# Patient Record
Sex: Female | Born: 1935 | Race: White | Hispanic: Yes
Health system: Midwestern US, Community
[De-identification: ages and names within clinical notes are randomized; demographics above are authoritative.]

## PROBLEM LIST (undated history)

## (undated) DIAGNOSIS — E785 Hyperlipidemia, unspecified: Secondary | ICD-10-CM

## (undated) DIAGNOSIS — N390 Urinary tract infection, site not specified: Secondary | ICD-10-CM

## (undated) DIAGNOSIS — R300 Dysuria: Secondary | ICD-10-CM

## (undated) DIAGNOSIS — S22000A Wedge compression fracture of unspecified thoracic vertebra, initial encounter for closed fracture: Principal | ICD-10-CM

## (undated) DIAGNOSIS — R9389 Abnormal findings on diagnostic imaging of other specified body structures: Secondary | ICD-10-CM

## (undated) DIAGNOSIS — M546 Pain in thoracic spine: Secondary | ICD-10-CM

## (undated) DIAGNOSIS — T7840XA Allergy, unspecified, initial encounter: Secondary | ICD-10-CM

## (undated) DIAGNOSIS — H269 Unspecified cataract: Secondary | ICD-10-CM

## (undated) HISTORY — DX: Allergy, unspecified, initial encounter: T78.40XA

## (undated) HISTORY — DX: Unspecified cataract: H26.9

## (undated) HISTORY — PX: ABDOMINAL HYSTERECTOMY: SHX81

---

## 2001-12-25 HISTORY — PX: COLONOSCOPY: SHX174

## 2014-07-02 DIAGNOSIS — G459 Transient cerebral ischemic attack, unspecified: Secondary | ICD-10-CM | POA: Insufficient documentation

## 2014-11-30 ENCOUNTER — Inpatient Hospital Stay: Admit: 2014-11-30 | Discharge: 2014-11-30 | Attending: Emergency Medicine

## 2014-11-30 ENCOUNTER — Emergency Department: Admit: 2014-11-30 | Primary: Registered Nurse

## 2014-11-30 DIAGNOSIS — M545 Low back pain, unspecified: Secondary | ICD-10-CM

## 2014-11-30 MED ORDER — LIDOCAINE 5 % EX PTCH
5 % | MEDICATED_PATCH | Freq: Every day | CUTANEOUS | Status: DC
Start: 2014-11-30 — End: 2017-03-20

## 2014-11-30 MED ORDER — LIDOCAINE 5 % EX PTCH
5 % | Freq: Every day | CUTANEOUS | Status: DC
Start: 2014-11-30 — End: 2014-11-30
  Administered 2014-11-30: 19:00:00 1 via TRANSDERMAL

## 2014-11-30 MED FILL — LIDODERM 5 % EX PTCH: 5 % | CUTANEOUS | Qty: 1

## 2014-11-30 NOTE — ED Provider Notes (Signed)
[facility] Emergency Department    CHIEF COMPLAINT  Fall; Back Pain; Head Injury; Nausea; and Shaking    HPI Comments: Lauren Bryant is an 80 year old female who presents to the ED with complaints of back pain after a fall. The patient states that on Friday evening she was walking into the garage and did not turn on the light.  She tripped and fell landing mostly on her back but does note that she struck her head on the concrete floor as well. She denies LOC, nausea, or vomiting at the time of or since the injury.  The patient notes that she is having back pain that is more to the right side and this is been ongoing since her fall.  She does state that this correlates with where she landed when she fell.  She denies abdominal pain, radiculopathy, saddle anesthesia, or changes in bowel or bladder function.  The daughter did state that the patient does have a small bruise on the right lateral thigh but otherwise there are no signs of injury.  Patient did point out that she doesn't even have a "goose egg"on the back of her head.  Patient has been up and walking since the injury and notes that her pain will increase in the morning and upon getting up out of a chair.  He does note that it will increase her pain to sit for long periods times she will have to change positions frequently.  [place] and is in town visiting with family.  She denies past medical history of CAD or diabetes.  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  Per the daughter the patient had a full workup in April for her complaints of dizziness that included carotid Dopplers which were negative at that time.  The patient does state that she continues to smoke a half pack per day.  She takes a baby aspirin at home and denies being on any other blood thinning medication.  Patient arrived to the ED via private car and is accompanied by  her daughter and son-in-law who are bedside for the ED visit.    The history is provided by the patient and a relative.     Nursing notes reviewed.   Past Medical History   Diagnosis Date   ??? Hyperlipidemia      Past Surgical History   Procedure Laterality Date   ??? Hysterectomy     ??? Appendectomy     ??? Tubal ligation       History reviewed. No pertinent family history.  History     Social History   ??? Marital Status: Widowed     Spouse Name: N/A     Number of Children: N/A   ??? Years of Education: N/A     Occupational History   ??? Not on file.     Social History Main Topics   ??? Smoking status: Current Every Day Smoker -- 0.50 packs/day     Types: Cigarettes   ??? Smokeless tobacco: Not on file   ??? Alcohol Use: No   ??? Drug Use: Not on file   ??? Sexual Activity: Not on file     Other Topics Concern   ??? Not on file     Social History Narrative   ??? No narrative on file     No current facility-administered medications for this encounter.  Current Outpatient Prescriptions   Medication Sig Dispense Refill   ??? venlafaxine (EFFEXOR) 37.5 MG tablet Take 37.5 mg by mouth daily     ??? simvastatin (ZOCOR) 20 MG tablet Take 20 mg by mouth nightly     ??? tolterodine (DETROL LA) 4 MG ER capsule Take 4 mg by mouth daily     ??? aspirin 81 MG tablet Take 81 mg by mouth daily     ??? ALPRAZolam (XANAX) 0.25 MG tablet Take 0.25 mg by mouth 3 times daily as needed for Sleep       No Known Allergies      Review of Systems   Constitutional: Negative for fever and chills.   HENT: Negative.    Respiratory: Negative.  Negative for shortness of breath.    Cardiovascular: Negative.  Negative for chest pain.   Gastrointestinal: Positive for nausea. Negative for vomiting, abdominal pain and diarrhea.   Genitourinary: Negative.  Negative for difficulty urinating.   Musculoskeletal: Positive for back pain. Negative for gait problem and neck pain.   Skin: Negative.  Negative for wound.   Neurological: Positive for dizziness. Negative for syncope, numbness  and headaches.   Hematological: Does not bruise/bleed easily.   Psychiatric/Behavioral: Negative for confusion.       Physical Exam   Constitutional: She is oriented to person, place, and time. Vital signs are normal. She appears well-developed and well-nourished.  Non-toxic appearance. No distress.   Awake and alert. Cooperative. Observed resting in bed, NAD   HENT:   Head: Normocephalic and atraumatic.   Right Ear: External ear normal.   Left Ear: External ear normal.   Nose: Nose normal.   Mouth/Throat: Uvula is midline, oropharynx is clear and moist and mucous membranes are normal.   Eyes: Conjunctivae and EOM are normal. Pupils are equal, round, and reactive to light. No scleral icterus.   Neck: Trachea normal and normal range of motion. Neck supple. No spinous process tenderness and no muscular tenderness present.   Cardiovascular: Normal rate, regular rhythm, S1 normal, S2 normal and normal heart sounds.    Pulses:       Radial pulses are 2+ on the right side, and 2+ on the left side.   Capillary refill is brisk in bilateral upper extremities   Pulmonary/Chest: Effort normal and breath sounds normal. She has no decreased breath sounds.   Equal and symmetric chest rise.  Breathing is unlabored. Speaking comfortably in full sentences.   Abdominal: Normal appearance and bowel sounds are normal. She exhibits no distension and no pulsatile midline mass. There is no tenderness.   Musculoskeletal: Normal range of motion.        Lumbar back: She exhibits tenderness. She exhibits normal range of motion and no bony tenderness.   No gross deformities or trauma noted.  On exam of lumbar spine, there is no swelling, bruising, or color change noted. There is no midline bony tenderness, without crepitus, deformity, or step off.  Patient exhibits tenderness of paraspinal musculature to right of midline.  There is point tenderness over the SI Joint.     Neurological: She is alert and oriented to person, place, and time. She  has normal strength. No cranial nerve deficit. GCS eye subscore is 4. GCS verbal subscore is 5. GCS motor subscore is 6.   Reflex Scores:       Patellar reflexes are 2+ on the right side and 2+ on the left side.  No focal motor or sensory deficits. Gait  was observed and is normal.   Skin: Skin is warm, dry and intact.   Skin is with good color.   Psychiatric:   Mood and affect appropriate. Speech is clear and articulate.   Nursing note and vitals reviewed.      Procedures: None    Radiology  Xr Pelvis Standard    11/30/2014   EXAMINATION: SINGLE VIEW OF THE PELVIS  11/30/2014 12:34 pm  COMPARISON: None.  HISTORY: Status post fall with acute pelvic pain.  Initial exam.  FINDINGS: The bone mineralization is osteopenic.  There degenerative change involving the lower lumbar spine and hips.  No acute fractures or dislocations are seen.  There is no diastasis of the pubic symphysis or sacroiliac joints. There are surgical clips within the pelvis.     11/30/2014   IMPRESSION: 1. No acute osseous abnormality involving the pelvis.     ED COURSE  Patient seen and evaluated. Work up preformed as above. Diagnostic testing reviewed and results discussed with patient.    This patient was also seen and evaluated by Joellyn Quailsina Gozman, MD    I spoke with Dr. Waymon BudgeGozman. We thoroughly discussed the history, physical exam, laboratory and imaging studies, as well as, emergency department course.     Ms. Lauren Bryant presented to the ED today with complaints of back pain after fall.  Patient presented with multiple complaints that included head injury, dizziness, and nausea.  Her back pain was her biggest concern.  On exam the patient is without bony midline tenderness to the lumbar spine.  She is very point tender to the right of the spine over the SI joint area.  The patient has been up and ambulating since the fall and without red flags symptoms of low back pain.  She has been managing at home with Aleve OTC twice a day.  She presented concerned because  her pain is not improving.  X-ray of the pelvis was ordered and negative for fracture or dislocations.  The patient will be prescribed a lidocaine patch and instructed to continue with the Aleve over-the-counter as she has been previously doing.  I did advise the patient to stay well hydrated and take this with food.  As the patient is from out of town I did instruct her to follow up with her PCP once she is back home or return to the ED for increasing symptoms.    The patient is stating she did hit the back of her head on the floor when she fell and hit her back.  She had no LOC or vomiting at the time of the injury or since. There are no hematomas or bony instability to palpation of the head. She did have nausea Saturday evening but this has since resolved.  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  Currently the patient is neurologically intact and she is not on any blood thinners.  I do not feel that further imaging is needed at this time based on the length of the injury and her current physical exam.  I did speak with the patient and her family regarding this and they are in agreement with this plan.      Patient received lidocaine patch for pain, just prior to discharge.  A discussion was had with the patient in her family regarding diagnosis, diagnostic testing results, treatment/ plan of care, and follow up.  All questions were answered. Patient  will follow up with PCP for further evaluation/treatment. Patient will return to ED for new/worsening symptoms.    Patient was sent home with a prescription for lidocaine patch. I did council patient on appropriate use of these medication.      MDM  No results found for this visit on 11/30/14.    I estimate there is LOW risk for ABDOMINAL AORTIC ANEURYSM, CAUDA EQUINA SYNDROME, EPIDURAL MASS LESION, SPINAL STENOSIS, OR HERNIATED DISK CAUSING SEVERE STENOSIS, thus I  consider the discharge disposition reasonable. Ursula Beath and I have discussed the diagnosis and risks, and we agree with discharging home to follow-up with their primary doctor. We also discussed returning to the Emergency Department immediately if new or worsening symptoms occur. We have discussed the symptoms which are most concerning (e.g., saddle anesthesia, urinary or bowel incontinence or retention, changing or worsening pain) that necessitate immediate return.    Final Impression    1. Right-sided low back pain without sciatica    2. Fall from other slipping, tripping, or stumbling        Blood pressure 130/66, pulse 71, temperature 97.9 ??F (36.6 ??C), temperature source Oral, resp. rate 15, height 5\' 5"  (1.651 m), weight 137 lb (62.143 kg), SpO2 97 %.    DISPOSITION  Patient was discharged to home in good condition.            Jennette Bill, NP  12/02/14 [phone]

## 2014-11-30 NOTE — ED Notes (Signed)
Called pharmacy regarding lido patch    Sharee PimpleJennifer Merlina Marchena, RN  11/30/14 1329

## 2014-11-30 NOTE — ED Provider Notes (Signed)
I independently performed a history and physical on Lauren Bryant.   All diagnostic, treatment, and disposition decisions were made by myself in conjunction with the mid-level provider. Please see their note for full H+P.     Briefly, this is a 80 y.o. female who presented to the ED with fall.  The patient had a mechanical fall in the garage 3 days ago.  She did hit her head but did not lose consciousness.  She has been having right-sided lower back pain since that time.  It is currently 5 out of 10.  It is worse with movement.  She is able to ambulate with a cane, which is her baseline.  She denies headache, nausea or vomiting.  On exam the patient has normal mental status and unremarkable neurologic exam.  Her spines are nontender.  She does have some tenderness to palpation over the right SI joint.  Abdomen is nontender.     No results found for this visit on 11/30/14.    ED COURSE/MDM  Patient seen and evaluated. Old records reviewed. Labs and imaging reviewed and results discussed with patient.  Patient comes in for evaluation of fall.  She has a normal neurologic exam and is 3 days out of her fall.  I do not believe she requires any head imaging.  Her spines are nontender.  She does have some tenderness to palpation of the right SI joint.  Pelvic x-ray was obtained and is negative.  I'm not concerned for an occult fracture.  The patient is ambulatory.  I do not believe that she requires any further workup in the emergency department.  She'll be discharged home with her family.At this time I do not believe the pt requires any further work up or management. My standard discharge instructions were provided. She was given strict return precautions. All questions were answered. She was discharged home in stable condition.         CLINICAL IMPRESSION  1. Right-sided low back pain without sciatica    2. Fall from other slipping, tripping, or stumbling        Blood pressure 130/66, pulse 71, temperature 97.9 ??F (36.6  ??C), temperature source Oral, resp. rate 15, height 5\' 5"  (1.651 m), weight 137 lb (62.143 kg), SpO2 97 %.    DISPOSITION  Lauren Bryant was discharged to home in stable condition.       Lauren Quailsina Finlee Concepcion, MD  11/30/14 [phone]

## 2015-03-12 ENCOUNTER — Ambulatory Visit (INDEPENDENT_AMBULATORY_CARE_PROVIDER_SITE_OTHER): Payer: Medicare Other | Admitting: Podiatry

## 2015-03-12 ENCOUNTER — Encounter: Payer: Self-pay | Admitting: Podiatry

## 2015-03-12 ENCOUNTER — Ambulatory Visit (INDEPENDENT_AMBULATORY_CARE_PROVIDER_SITE_OTHER): Payer: Medicare Other

## 2015-03-12 VITALS — BP 112/62 | HR 82 | Resp 16 | Ht 65.0 in | Wt 130.0 lb

## 2015-03-12 DIAGNOSIS — S99921A Unspecified injury of right foot, initial encounter: Secondary | ICD-10-CM | POA: Diagnosis not present

## 2015-03-12 DIAGNOSIS — S92301B Fracture of unspecified metatarsal bone(s), right foot, initial encounter for open fracture: Secondary | ICD-10-CM

## 2015-03-12 NOTE — Progress Notes (Signed)
Subjective:     Patient ID: Linda Owens, female   DOB: 03/06/1924, 81 y.o.   MRN: [id number]  HPI patient presents stating that she fell on her right foot around 3 weeks ago and it's been swelling on the side and painful but getting mildly better   Review of Systems  All other systems reviewed and are negative.      Objective:   Physical Exam  Constitutional: She is oriented to person, place, and time.  Musculoskeletal: Normal range of motion.  Neurological: She is oriented to person, place, and time.  Skin: Skin is warm and dry.  Nursing note and vitals reviewed.  neurovascular status found to be diminished but upon questioning she has no claudication symptoms and is able to walk without discomfort in her lower legs. She's got edema in the lateral side of the right foot with quite a bit of pain around the fifth metatarsal base and mild bruising of her forefoot. Patient is wearing regular shoes and is walking currently with a cane     Assessment:     Probable fracture base of fifth metatarsal right    Plan:     H&P and x-rays reviewed with patient. At this point I have recommended ice therapy and continued cane usage and she cannot take a boot or surgical shoe due to her instability and that this should heal okay as the pain is getting better. If no improvement she will reappoint for us to recheck and may require some form of immobilization

## 2015-03-12 NOTE — Progress Notes (Signed)
   Subjective:    Patient ID: Linda Owens, female    DOB: 08/16/1924, 81 y.o.   MRN: [id number]  HPI Comments: "I hurt this foot"  Patient c/o tender lateral and some dorsal forefoot right for about 1 month. She states she got up from a nap and foot was asleep and fell. It twisted. Got swollen and bruised. Better today, but still hurts sometimes.     Review of Systems  Genitourinary: Positive for frequency.  Musculoskeletal: Positive for back pain and gait problem.  Neurological: Positive for weakness.  Hematological: Bruises/bleeds easily.       Objective:   Physical Exam        Assessment & Plan:

## 2015-08-26 ENCOUNTER — Other Ambulatory Visit: Payer: Self-pay | Admitting: Family Medicine

## 2015-08-26 DIAGNOSIS — R42 Dizziness and giddiness: Secondary | ICD-10-CM

## 2015-09-03 ENCOUNTER — Ambulatory Visit
Admission: RE | Admit: 2015-09-03 | Discharge: 2015-09-03 | Disposition: A | Discharge status: Home / Self Care | Payer: Medicare Other | Source: Ambulatory Visit | Attending: Family Medicine | Admitting: Family Medicine

## 2015-09-03 DIAGNOSIS — R42 Dizziness and giddiness: Secondary | ICD-10-CM

## 2015-09-03 DIAGNOSIS — I6523 Occlusion and stenosis of bilateral carotid arteries: Secondary | ICD-10-CM | POA: Insufficient documentation

## 2015-09-22 DIAGNOSIS — R42 Dizziness and giddiness: Secondary | ICD-10-CM | POA: Insufficient documentation

## 2016-09-14 ENCOUNTER — Ambulatory Visit: Payer: Medicare Other | Admitting: Physical Therapy

## 2016-09-29 ENCOUNTER — Encounter: Payer: Medicare Other | Admitting: Physical Therapy

## 2016-10-06 ENCOUNTER — Encounter: Payer: Medicare Other | Admitting: Physical Therapy

## 2016-10-13 ENCOUNTER — Encounter: Payer: Medicare Other | Admitting: Physical Therapy

## 2016-10-20 ENCOUNTER — Encounter: Payer: Medicare Other | Admitting: Physical Therapy

## 2016-10-27 ENCOUNTER — Encounter: Payer: Medicare Other | Admitting: Physical Therapy

## 2017-03-12 ENCOUNTER — Ambulatory Visit: Payer: Medicare Other | Admitting: Hematology and Oncology

## 2017-03-20 ENCOUNTER — Inpatient Hospital Stay
Admit: 2017-03-20 | Discharge: 2017-03-22 | Disposition: A | Discharge status: Home / Self Care | Payer: Medicare Other | Source: Home / Self Care | Admitting: Family Medicine

## 2017-03-20 ENCOUNTER — Encounter: Admit: 2017-03-20 | Primary: Registered Nurse

## 2017-03-20 DIAGNOSIS — J189 Pneumonia, unspecified organism: Secondary | ICD-10-CM

## 2017-03-20 LAB — PROTIME-INR
INR: 1.21 — ABNORMAL HIGH (ref 0.85–1.15)
Protime: 13.7 s — ABNORMAL HIGH (ref 9.6–13.0)

## 2017-03-20 LAB — URINALYSIS
Bilirubin Urine: NEGATIVE
Blood, Urine: NEGATIVE
Glucose, Ur: NEGATIVE mg/dL
Ketones, Urine: NEGATIVE mg/dL
Leukocyte Esterase, Urine: NEGATIVE
Nitrite, Urine: NEGATIVE
Protein, UA: NEGATIVE mg/dL
Specific Gravity, UA: 1.015 (ref 1.005–1.030)
Urobilinogen, Urine: 0.2 E.U./dL (ref <2.0)
pH, UA: 6 (ref 5.0–8.0)

## 2017-03-20 LAB — CBC WITH AUTO DIFFERENTIAL
Atypical Lymphocytes Relative: 2 % (ref 0–6)
Bands Relative: 2 % (ref 0–7)
Basophils %: 0 %
Basophils Absolute: 0 10*3/uL (ref 0.0–0.2)
Eosinophils %: 3 %
Eosinophils Absolute: 0.6 10*3/uL (ref 0.0–0.6)
Hematocrit: 36.9 % (ref 36.0–48.0)
Hemoglobin: 12.2 g/dL (ref 12.0–16.0)
Lymphocytes %: 3 %
Lymphocytes Absolute: 1 10*3/uL (ref 1.0–5.1)
MCH: 29.5 pg (ref 26.0–34.0)
MCHC: 33 g/dL (ref 31.0–36.0)
MCV: 89.4 fL (ref 80.0–100.0)
MPV: 11.6 fL — ABNORMAL HIGH (ref 5.0–10.5)
Monocytes %: 7 %
Monocytes Absolute: 1.5 10*3/uL — ABNORMAL HIGH (ref 0.0–1.3)
Neutrophils %: 83 %
Neutrophils Absolute: 17.7 10*3/uL — ABNORMAL HIGH (ref 1.7–7.7)
Platelets: 137 10*3/uL (ref 135–450)
RBC Morphology: NORMAL
RBC: 4.13 M/uL (ref 4.00–5.20)
RDW: 15.4 % (ref 12.4–15.4)
WBC: 20.8 10*3/uL — ABNORMAL HIGH (ref 4.0–11.0)

## 2017-03-20 LAB — COMPREHENSIVE METABOLIC PANEL
ALT: 10 U/L (ref 10–40)
AST: 21 U/L (ref 15–37)
Albumin/Globulin Ratio: 1.5 (ref 1.1–2.2)
Albumin: 4.3 g/dL (ref 3.4–5.0)
Alkaline Phosphatase: 58 U/L (ref 40–129)
Anion Gap: 14 (ref 3–16)
BUN: 16 mg/dL (ref 7–20)
CO2: 26 mmol/L (ref 21–32)
Calcium: 9.1 mg/dL (ref 8.3–10.6)
Chloride: 96 mmol/L — ABNORMAL LOW (ref 99–110)
Creatinine: 0.8 mg/dL (ref 0.6–1.2)
GFR African American: >60 (ref >60)
GFR Non-African American: >60 (ref >60)
Globulin: 2.8 g/dL
Glucose: 140 mg/dL — ABNORMAL HIGH (ref 70–99)
Potassium: 4.3 mmol/L (ref 3.5–5.1)
Sodium: 136 mmol/L (ref 136–145)
Total Bilirubin: 1.3 mg/dL — ABNORMAL HIGH (ref 0.0–1.0)
Total Protein: 7.1 g/dL (ref 6.4–8.2)

## 2017-03-20 LAB — RAPID INFLUENZA A/B ANTIGENS
Rapid Influenza A Ag: NEGATIVE
Rapid Influenza B Ag: NEGATIVE

## 2017-03-20 LAB — BRAIN NATRIURETIC PEPTIDE: Pro-BNP: 1345 pg/mL — ABNORMAL HIGH (ref 0–449)

## 2017-03-20 LAB — LACTIC ACID: Lactic Acid: 1.1 mmol/L (ref 0.4–2.0)

## 2017-03-20 LAB — TROPONIN: Troponin: <0.01 ng/mL (ref <0.01)

## 2017-03-20 LAB — SAMPLE POSSIBLE BLOOD BANK TESTING

## 2017-03-20 MED ORDER — NORMAL SALINE FLUSH 0.9 % IV SOLN
0.9 % | Freq: Two times a day (BID) | INTRAVENOUS | Status: DC
Start: 2017-03-20 — End: 2017-03-22
  Administered 2017-03-20 – 2017-03-22 (×4): 10 mL via INTRAVENOUS

## 2017-03-20 MED ORDER — MAGNESIUM HYDROXIDE 400 MG/5ML PO SUSP
400 MG/5ML | Freq: Every day | ORAL | Status: DC | PRN
Start: 2017-03-20 — End: 2017-03-22

## 2017-03-20 MED ORDER — ALBUTEROL SULFATE (2.5 MG/3ML) 0.083% IN NEBU
RESPIRATORY_TRACT | Status: DC | PRN
Start: 2017-03-20 — End: 2017-03-22

## 2017-03-20 MED ORDER — SIMVASTATIN 10 MG PO TABS
10 MG | Freq: Every evening | ORAL | Status: DC
Start: 2017-03-20 — End: 2017-03-21
  Administered 2017-03-20: 20 mg via ORAL

## 2017-03-20 MED ORDER — CEFTRIAXONE SODIUM 1 G IJ SOLR
1 g | Freq: Once | INTRAMUSCULAR | Status: AC
Start: 2017-03-20 — End: 2017-03-20
  Administered 2017-03-20: 18:00:00 1 g via INTRAVENOUS

## 2017-03-20 MED ORDER — ENOXAPARIN SODIUM 40 MG/0.4ML SC SOLN
40 MG/0.4ML | Freq: Every day | SUBCUTANEOUS | Status: DC
Start: 2017-03-20 — End: 2017-03-22
  Administered 2017-03-21 – 2017-03-22 (×2): 40 mg via SUBCUTANEOUS

## 2017-03-20 MED ORDER — ACETAMINOPHEN 500 MG PO TABS
500 MG | Freq: Once | ORAL | Status: AC
Start: 2017-03-20 — End: 2017-03-20
  Administered 2017-03-20: 17:00:00 1000 mg via ORAL

## 2017-03-20 MED ORDER — AZITHROMYCIN 500 MG IV SOLR
500 | INTRAVENOUS | Status: DC
Start: 2017-03-20 — End: 2017-03-21

## 2017-03-20 MED ORDER — NORMAL SALINE FLUSH 0.9 % IV SOLN
0.9 % | INTRAVENOUS | Status: DC | PRN
Start: 2017-03-20 — End: 2017-03-22

## 2017-03-20 MED ORDER — SODIUM CHLORIDE 0.9 % IV BOLUS
0.9 % | Freq: Once | INTRAVENOUS | Status: AC
Start: 2017-03-20 — End: 2017-03-20
  Administered 2017-03-20: 17:00:00 2000 mL via INTRAVENOUS

## 2017-03-20 MED ORDER — ALPRAZOLAM 0.25 MG PO TABS
0.25 MG | Freq: Three times a day (TID) | ORAL | Status: DC | PRN
Start: 2017-03-20 — End: 2017-03-22
  Administered 2017-03-22: 0.25 mg via ORAL

## 2017-03-20 MED ORDER — KETOROLAC TROMETHAMINE 30 MG/ML IJ SOLN
30 MG/ML | Freq: Once | INTRAMUSCULAR | Status: AC
Start: 2017-03-20 — End: 2017-03-20
  Administered 2017-03-20: 19:00:00 15 mg via INTRAVENOUS

## 2017-03-20 MED ORDER — ACETAMINOPHEN 325 MG PO TABS
325 MG | ORAL | Status: DC | PRN
Start: 2017-03-20 — End: 2017-03-22

## 2017-03-20 MED ORDER — LIDOCAINE 5 % EX PTCH
5 % | Freq: Every day | CUTANEOUS | Status: DC
Start: 2017-03-20 — End: 2017-03-22

## 2017-03-20 MED ORDER — ONDANSETRON HCL 4 MG/2ML IJ SOLN
4 MG/2ML | Freq: Four times a day (QID) | INTRAMUSCULAR | Status: DC | PRN
Start: 2017-03-20 — End: 2017-03-22

## 2017-03-20 MED ORDER — VENLAFAXINE HCL 37.5 MG PO TABS
37.5 MG | Freq: Every day | ORAL | Status: DC
Start: 2017-03-20 — End: 2017-03-22
  Administered 2017-03-20 – 2017-03-22 (×2): 37.5 mg via ORAL

## 2017-03-20 MED ORDER — TROSPIUM CHLORIDE 20 MG PO TABS
20 MG | Freq: Every evening | ORAL | Status: DC
Start: 2017-03-20 — End: 2017-03-22
  Administered 2017-03-20 – 2017-03-22 (×2): 20 mg via ORAL

## 2017-03-20 MED ORDER — DEXTROSE 5 % IV SOLN (MINI-BAG)
5 % | Freq: Once | INTRAVENOUS | Status: AC
Start: 2017-03-20 — End: 2017-03-20
  Administered 2017-03-20: 18:00:00 500 mg via INTRAVENOUS

## 2017-03-20 MED ORDER — CEFTRIAXONE SODIUM 1 G IJ SOLR
1 | INTRAMUSCULAR | Status: DC
Start: 2017-03-20 — End: 2017-03-21

## 2017-03-20 MED ORDER — ASPIRIN 81 MG PO CHEW
81 MG | Freq: Every day | ORAL | Status: DC
Start: 2017-03-20 — End: 2017-03-22
  Administered 2017-03-20 – 2017-03-22 (×2): 81 mg via ORAL

## 2017-03-20 MED FILL — SANCTURA 20 MG PO TABS: 20 MG | ORAL | Qty: 1

## 2017-03-20 MED FILL — KETOROLAC TROMETHAMINE 30 MG/ML IJ SOLN: 30 MG/ML | INTRAMUSCULAR | Qty: 1

## 2017-03-20 MED FILL — VENLAFAXINE HCL 37.5 MG PO TABS: 37.5 MG | ORAL | Qty: 1

## 2017-03-20 MED FILL — ASPIRIN LOW STRENGTH 81 MG PO CHEW: 81 MG | ORAL | Qty: 1

## 2017-03-20 MED FILL — SIMVASTATIN 10 MG PO TABS: 10 MG | ORAL | Qty: 2

## 2017-03-20 MED FILL — AZITHROMYCIN 500 MG IV SOLR: 500 MG | INTRAVENOUS | Qty: 500

## 2017-03-20 MED FILL — MONOJECT FLUSH SYRINGE 0.9 % IV SOLN: 0.9 % | INTRAVENOUS | Qty: 10

## 2017-03-20 MED FILL — SODIUM CHLORIDE 0.9 % IV SOLN: 0.9 % | INTRAVENOUS | Qty: 2000

## 2017-03-20 MED FILL — CEFTRIAXONE SODIUM 1 G IJ SOLR: 1 g | INTRAMUSCULAR | Qty: 1

## 2017-03-20 MED FILL — ACETAMINOPHEN 500 MG PO TABS: 500 MG | ORAL | Qty: 2

## 2017-03-20 NOTE — ED Notes (Signed)
Bed: 14  Expected date:   Expected time:   Means of arrival:   Comments:     Lois HuxleyCatherine Taryll Reichenberger, RN  03/20/17 1139

## 2017-03-20 NOTE — ED Notes (Signed)
Straight cath urine obtained     Janee MornJohn Nieves Chapa, RN  03/20/17 1230

## 2017-03-20 NOTE — H&P (Signed)
Hospital Medicine History & Physical      PCP: No primary care provider on file.    Date of Admission: 03/20/2017    Date of Service: Pt seen/examined on 03/20/17 and Admitted to Inpatient with expected LOS greater than two midnights due to medical therapy.     Chief Complaint:  Fever and weakness    History Of Present Illness:      83 y.o. female who presented to [facility] with fever and weakness. Pt's family brought her to the ED because she was confused. She was having fevers today and was weaker than normal. She reportedly gets frequent UTIs. In ED, she was found to have pneumonia and was febrile. She was started on Abx. She is a poor historian.     Past Medical History:          Diagnosis Date   ??? Hyperlipidemia        Past Surgical History:          Procedure Laterality Date   ??? APPENDECTOMY     ??? HYSTERECTOMY     ??? TUBAL LIGATION         Medications Prior to Admission:      Prior to Admission medications    Medication Sig Start Date End Date Taking? Authorizing Provider   venlafaxine (EFFEXOR) 37.5 MG tablet Take 37.5 mg by mouth daily   Yes Historical Provider, MD   simvastatin (ZOCOR) 20 MG tablet Take 20 mg by mouth nightly   Yes Historical Provider, MD   tolterodine (DETROL LA) 4 MG ER capsule Take 4 mg by mouth daily   Yes Historical Provider, MD   aspirin 81 MG tablet Take 81 mg by mouth daily   Yes Historical Provider, MD   ALPRAZolam (XANAX) 0.25 MG tablet Take 0.25 mg by mouth 3 times daily as needed for Sleep   Yes Historical Provider, MD       Allergies:  Patient has no known allergies.    Social History:      The patient currently lives with family.    TOBACCO:   reports that she has been smoking Cigarettes.  She has been smoking about 0.50 packs per day. She has never used smokeless tobacco.  ETOH:   reports that she does not drink alcohol.      Family History:      Reviewed in detail and negative for DM, CAD, Cancer, CVA.   History reviewed. No pertinent family history.    REVIEW OF  SYSTEMS:   Pertinent positives as noted in the HPI. All other systems reviewed and negative.    PHYSICAL EXAM PERFORMED:    BP (!) 92/54    Pulse 98    Temp 99.8 ??F (37.7 ??C) (Oral)    Resp 18    Ht 5\' 5"  (1.651 m)    Wt 137 lb (62.1 kg)    SpO2 96%    BMI 22.80 kg/m??     General appearance:  No apparent distress, appears stated age and cooperative.  HEENT:  Normal cephalic, atraumatic without obvious deformity. Pupils equal, round, and reactive to light.  Extra ocular muscles intact. Conjunctivae/corneas clear.  Neck: Supple, with full range of motion. No jugular venous distention. Trachea midline.  Respiratory:  Normal respiratory effort. Clear to auscultation, bilaterally without Rales/Wheezes/Rhonchi.  Cardiovascular:  Regular rate and rhythm with normal S1/S2 without murmurs, rubs or gallops.  Abdomen: Soft, non-tender, non-distended with normal bowel sounds.  Musculoskeletal:  No clubbing, cyanosis or  edema bilaterally.  Full range of motion without deformity.  Skin: Skin color, texture, turgor normal.  No rashes or lesions.  Neurologic:  Neurovascularly intact without any focal sensory/motor deficits. Cranial nerves: II-XII intact, grossly non-focal.  Psychiatric:  Alert and oriented, thought content appropriate, normal insight  Capillary Refill: Brisk,< 3 seconds   Peripheral Pulses: +2 palpable, equal bilaterally       Labs:     Recent Labs      03/20/17   1220   WBC  20.8*   HGB  12.2   HCT  36.9   PLT  137     Recent Labs      03/20/17   1220   NA  136   K  4.3   CL  96*   CO2  26   BUN  16   CREATININE  0.8   CALCIUM  9.1     Recent Labs      03/20/17   1220   AST  21   ALT  10   BILITOT  1.3*   ALKPHOS  58     Recent Labs      03/20/17   1220   INR  1.21*     Recent Labs      03/20/17   1220   TROPONINI  <0.01       Urinalysis:      Lab Results   Component Value Date    NITRU Negative 03/20/2017    BLOODU Negative 03/20/2017    SPECGRAV 1.015 03/20/2017    GLUCOSEU Negative 03/20/2017       Radiology:      CXR: I have reviewed the CXR with the following interpretation: basilar opacities  EKG:  I have reviewed the EKG with the following interpretation: sinus tachycardia, PVCs    XR CHEST PORTABLE   Final Result   Nonspecific basilar reticular opacities.  Findings may indicate atelectasis,   chronic scarring, or airspace disease.             ASSESSMENT:    Active Hospital Problems    Diagnosis Date Noted   ??? Pneumonia [J18.9] 03/20/2017     83 yo female with h/o HLP, anxiety, frequent UTIs admitted with CAP.     PLAN:    CAP - started on Rocephin and Azithromycin on 3/27. Blood Cx pending. CXR with bibasilar opacities. Fever, leukocytosis, confusion on admission.    Anxiety - continue home xanax prn in moderation as with confusion.    HLP - continue home statin.    Acute delirium - confusion likely due to infection.  Supportive care and treat infection.    DVT Prophylaxis: lovenox  Diet:    Code Status: No Order    PT/OT Eval Status: will order once less confused    Dispo - 2-3 days       Beryle Flock, MD    Thank you No primary care provider on file. for the opportunity to be involved in this patient's care. If you have any questions or concerns please feel free to contact me at [phone].

## 2017-03-20 NOTE — ED Provider Notes (Signed)
[facility] Emergency Department    CHIEF COMPLAINT  Chief Complaint   Patient presents with   ??? Fever     UTI was treated end of february and then PCP states it was [facility] and treated her again that ended yesterday. Pt is from [place] and up here - her daughter brought her up   ??? Fatigue      HISTORY OF PRESENT ILLNESS  Lauren Bryant is a 83 y.o. female  who presents to the ED complaining of Altered mental status and generalized malaise and fatigue with subjective fevers since this morning.  She is here with her daughter who provides most of the history.  The patient is very hard of hearing and seems a little confused.  She denies any pain in the chest or abdomen to me, no nausea vomiting or diarrhea or back pains.  She does have frequent UTIs and in February was treated by her primary care physician in the [facility] for persistent infection in the urine, presumably E. coli per the daughter based on the urine culture they did there.  I do not have access to those records.  Last night the patient was apparently fairly independent and doing well, not complaining of anything, able to go out to dinner with the family and get to bed and perform some activities of daily living.  This morning though she is unable to get out of bed due to her malaise and fatigue.  She has not had any significant coughing.  She does have a smoking history.  She does not feel short of breath.    No other complaints, modifying factors or associated symptoms.     I have reviewed the following from the nursing documentation.    Past Medical History:   Diagnosis Date   ??? Hyperlipidemia      Past Surgical History:   Procedure Laterality Date   ??? APPENDECTOMY     ??? HYSTERECTOMY     ??? TUBAL LIGATION       History reviewed. No pertinent family history.  Social History     Social History   ??? Marital status: Widowed     Spouse name: N/A   ??? Number of children: N/A   ??? Years of education: N/A     Occupational History   ??? Not on  file.     Social History Main Topics   ??? Smoking status: Current Every Day Smoker     Packs/day: 0.50     Types: Cigarettes   ??? Smokeless tobacco: Never Used   ??? Alcohol use No   ??? Drug use: Unknown   ??? Sexual activity: Not on file     Other Topics Concern   ??? Not on file     Social History Narrative   ??? No narrative on file     Current Facility-Administered Medications   Medication Dose Route Frequency Provider Last Rate Last Dose   ??? azithromycin (ZITHROMAX) 500 mg in D5W addavial  500 mg Intravenous Once Dalbert Batman, MD 250 mL/hr at 03/20/17 1400 500 mg at 03/20/17 1400   ??? ketorolac (TORADOL) injection 15 mg  15 mg Intravenous Once Dalbert Batman, MD         Current Outpatient Prescriptions   Medication Sig Dispense Refill   ??? venlafaxine (EFFEXOR) 37.5 MG tablet Take 37.5 mg by mouth daily     ??? simvastatin (ZOCOR) 20 MG tablet Take 20 mg by mouth nightly     ???  tolterodine (DETROL LA) 4 MG ER capsule Take 4 mg by mouth daily     ??? aspirin 81 MG tablet Take 81 mg by mouth daily     ??? ALPRAZolam (XANAX) 0.25 MG tablet Take 0.25 mg by mouth 3 times daily as needed for Sleep     ??? lidocaine (LIDODERM) 5 % Place 1 patch onto the skin daily 12 hours on, 12 hours off. 30 patch 0     No Known Allergies    REVIEW OF SYSTEMS  10 systems reviewed, pertinent positives per HPI otherwise noted to be negative.    PHYSICAL EXAM  BP 100/61    Pulse 106    Temp 101.2 ??F (38.4 ??C) (Rectal)    Resp 16    Ht 5\' 5"  (1.651 m)    Wt 137 lb (62.1 kg)    SpO2 94%    BMI 22.80 kg/m??    GENERAL APPEARANCE: Awake and alert. Cooperative. No distress.  Pale.  HENT: Normocephalic. Atraumatic. Mucous membranes are dry.  NECK: Supple.  EYES: PERRL. EOM's grossly intact.  HEART/CHEST: Reg rhythm, tachy rate. No murmurs.  No chest wall tenderness.  LUNGS: Respirations unlabored. CTAB. Good air exchange. No rales.  No wheezing.  ABDOMEN: No tenderness. Soft. Non-distended. No masses. No organomegaly. No guarding or rebound. Normal  bowel sounds throughout.  MUSCULOSKELETAL: No extremity edema. Compartments soft.  No deformity.  No tenderness in the extremities.  All extremities neurovascularly intact.  SKIN: Warm and dry. No acute rashes.   NEUROLOGICAL: Alert and oriented. CN's 2-12 intact. No gross facial drooping. Strength 5/5, sensation intact. 2 plus DTR's in knees bilaterally.  Gait not assessed due to weakness.  Hard of hearing.    LABS  I have reviewed all labs for this visit.   Results for orders placed or performed during the hospital encounter of 03/20/17   Rapid influenza A/B antigens   Result Value Ref Range    Rapid Influenza A Ag Negative Negative    Rapid Influenza B Ag Negative Negative   CBC Auto Differential   Result Value Ref Range    WBC 20.8 (H) 4.0 - 11.0 K/uL    RBC 4.13 4.00 - 5.20 M/uL    Hemoglobin 12.2 12.0 - 16.0 g/dL    Hematocrit 16.1 09.6 - 48.0 %    MCV 89.4 80.0 - 100.0 fL    MCH 29.5 26.0 - 34.0 pg    MCHC 33.0 31.0 - 36.0 g/dL    RDW 04.5 40.9 - 81.1 %    Platelets 137 135 - 450 K/uL    MPV 11.6 (H) 5.0 - 10.5 fL    Neutrophils % 83.0 %    Lymphocytes % 3.0 %    Monocytes % 7.0 %    Eosinophils % 3.0 %    Basophils % 0.0 %    Neutrophils # 17.7 (H) 1.7 - 7.7 K/uL    Lymphocytes # 1.0 1.0 - 5.1 K/uL    Monocytes # 1.5 (H) 0.0 - 1.3 K/uL    Eosinophils # 0.6 0.0 - 0.6 K/uL    Basophils # 0.0 0.0 - 0.2 K/uL    Bands Relative 2 0 - 7 %    Atypical Lymphocytes Relative 2 0 - 6 %    RBC Morphology Normal    Comprehensive metabolic panel   Result Value Ref Range    Sodium 136 136 - 145 mmol/L    Potassium 4.3 3.5 - 5.1 mmol/L  Chloride 96 (L) 99 - 110 mmol/L    CO2 26 21 - 32 mmol/L    Anion Gap 14 3 - 16    Glucose 140 (H) 70 - 99 mg/dL    BUN 16 7 - 20 mg/dL    CREATININE 0.8 0.6 - 1.2 mg/dL    GFR Non-African American >60 >60    GFR African American >60 >60    Calcium 9.1 8.3 - 10.6 mg/dL    Total Protein 7.1 6.4 - 8.2 g/dL    Alb 4.3 3.4 - 5.0 g/dL    Albumin/Globulin Ratio 1.5 1.1 - 2.2    Total Bilirubin  1.3 (H) 0.0 - 1.0 mg/dL    Alkaline Phosphatase 58 40 - 129 U/L    ALT 10 10 - 40 U/L    AST 21 15 - 37 U/L    Globulin 2.8 g/dL   Protime-INR   Result Value Ref Range    Protime 13.7 (H) 9.6 - 13.0 sec    INR 1.21 (H) 0.85 - 1.15   Troponin   Result Value Ref Range    Troponin <0.01 <0.01 ng/mL   Brain Natriuretic Peptide   Result Value Ref Range    Pro-BNP 1,345 (H) 0 - 449 pg/mL   Urinalysis, reflex to microscopic   Result Value Ref Range    Color, UA Yellow Straw/Yellow    Clarity, UA Clear Clear    Glucose, Ur Negative Negative mg/dL    Bilirubin Urine Negative Negative    Ketones, Urine Negative Negative mg/dL    Specific Gravity, UA 1.015 1.005 - 1.030    Blood, Urine Negative Negative    pH, UA 6.0 5.0 - 8.0    Protein, UA Negative Negative mg/dL    Urobilinogen, Urine 0.2 <2.0 E.U./dL    Nitrite, Urine Negative Negative    Leukocyte Esterase, Urine Negative Negative    Microscopic Examination Not Indicated     Urine Type Not Specified    Lactic Acid, Plasma   Result Value Ref Range    Lactic Acid 1.1 0.4 - 2.0 mmol/L   Sample Possible Blood Bank Testing   Result Value Ref Range    Specimen Status COLL    EKG 12 Lead   Result Value Ref Range    Ventricular Rate 102 BPM    Atrial Rate 102 BPM    P-R Interval 130 ms    QRS Duration 114 ms    Q-T Interval 380 ms    QTc Calculation (Bazett) 495 ms    P Axis 37 degrees    R Axis 93 degrees    T Axis 28 degrees    Diagnosis       Sinus tachycardia with occasional Premature ventricular complexes  Right bundle branch block  Abnormal ECG  No previous ECGs available       The 12 lead EKG was interpreted by me as follows:  Rate: tachycardia with a rate of 102  Rhythm: sinus with PVC's  Axis: normal  Intervals: right bundle branch block  ST segments: no ST elevations or depressions  T waves: no abnormal inversions  Non-specific T wave changes: present  Prior EKG comparison: No prior is currently available for comparison    RADIOLOGY    Xr Chest Portable    Result Date:  03/20/2017  EXAMINATION: SINGLE VIEW OF THE CHEST 03/20/2017 11:50 am COMPARISON: None. HISTORY: ORDERING PHYSICIAN PROVIDED HISTORY: sepsis TECHNOLOGIST PROVIDED HISTORY: Technologist Provided Reason for Exam: Fever (UTI was  treated end of february and then PCP states it was [facility] and treated her again that ended yesterday. Pt is from [place] and up here - her daughter brought her up) FINDINGS: The heart size and mediastinal contours are within normal limits.  Mild basilar reticular opacities, primarily involving the left lung base.  The costophrenic angles are preserved.   Right carotid atherosclerotic calcification noted.     Nonspecific basilar reticular opacities.  Findings may indicate atelectasis, chronic scarring, or airspace disease.     ED COURSE/MDM  Patient seen and evaluated. Old records reviewed. Labs and imaging reviewed and results discussed with patient.      After initial evaluation, differential diagnostic considerations included: stroke, TIA, intracranial bleed, trauma, infection/sepsis, medication side effect, intoxication/withdrawal, metabolic/electrolyte abnormalities    The patient's ED workup was notable for her tachycardia and mental status changes concerning for severe sepsis, urinalysis does not appear infected however she does have some symptoms of pneumonia with her cough and equivocal findings on her chest x-ray shows she was covered with Rocephin and azithromycin for now.  Her rapid flu is negative.  She does have a profound leukocytosis as well but her lactate is normal and her blood pressure is stable.  She'll be admitted for ongoing IV fluids and IV antibiotics.    During the patient's ED course, the patient was given:  Medications   azithromycin (ZITHROMAX) 500 mg in D5W addavial (500 mg Intravenous New Bag 03/20/17 1400)   ketorolac (TORADOL) injection 15 mg (not administered)   0.9 % sodium chloride bolus (2,000 mLs Intravenous New Bag 03/20/17 1231)   acetaminophen  (TYLENOL) tablet 1,000 mg (1,000 mg Oral Given 03/20/17 1231)   cefTRIAXone (ROCEPHIN) 1 g in sterile water 10 mL IV syringe (0 g Intravenous Stopped 03/20/17 1400)        CLINICAL IMPRESSION  1. Pneumonia due to organism    2. Severe sepsis (HCC)    3. Acute encephalopathy        Blood pressure 100/61, pulse 106, temperature 101.2 ??F (38.4 ??C), temperature source Rectal, resp. rate 16, height 5\' 5"  (1.651 m), weight 137 lb (62.1 kg), SpO2 94 %.    DISPOSITION  Lauren Bryant was admitted in fair condition.    I have discussed the findings of today's workup with the patient and addressed the patient's questions and concerns.  The plan is to admit to the hospital at this time under the hospitalist service.  I spoke with Dr. Billey Gosling who accepted the patient and will take over the patient's care.  The patient is agreeable with this plan.    The total critical care time spent while evaluating and treating this patient was at least 40 minutes.  This excludes time spent doing separately billable procedures.  This includes time at the bedside, data interpretation, medication management, obtaining critical history from collateral sources if the patient is unable to provide it directly, and physician consultation.  Specifics of interventions taken and potentially life-threatening diagnostic considerations are listed above in the medical decision making.    DISCLAIMER: This chart was created using Scientist, clinical (histocompatibility and immunogenetics).  Efforts were made by me to ensure accuracy, however some errors may be present due to limitations of this technology and occasionally words are not transcribed correctly.        Dalbert Batman, MD  03/20/17 [phone]

## 2017-03-20 NOTE — Plan of Care (Signed)
Problem: Falls - Risk of  Goal: Absence of falls  Outcome: Ongoing  Bed in lowest locked positions, call light within reach, side rails up x 2, bed check in place. Instructed patient to call before getting out of bed. Patient verbalized understanding.

## 2017-03-20 NOTE — Other (Signed)
03/20/17 2021   Encounter Summary   Services provided to: Patient   Referral/Consult From: Rounding   Continue Visiting (03/20/17/Pt was sleeping. Did not disturb her)

## 2017-03-20 NOTE — Other (Signed)
Patient Acct Nbr:  [id number]  Primary AUTH/CERT:    Primary Insurance Company Name:   MEDICARE  Primary Insurance Plan Name:  MEDICARE INPT/OUTPT  Primary Insurance Group Number:    Primary Insurance Plan Type: [facility]  Primary Insurance Policy Number:  [id number]    Secondary AUTH/CERT:    Secondary Insurance Company Name:   [employer]  Secondary Insurance Plan Name:  TRICARE M/CARE COMP  Secondary Insurance Group Number:    Secondary Insurance Plan Type: S  Secondary Insurance Policy Number:  [id number]

## 2017-03-20 NOTE — ED Notes (Signed)
Bedside report given       Janee MornJohn Arie Powell, RN  03/20/17 [phone]

## 2017-03-20 NOTE — ED Notes (Signed)
M/S 355-2 when clean     Leanne Lovely, RN  03/20/17 [phone]

## 2017-03-20 NOTE — Progress Notes (Signed)
Patient admitted to room 355-2 from ED.  Patient oriented to room, call light, bed rails, phone, lights and bathroom.  Patient instructed about the schedule of the day including: vital sign frequency, lab draws, possible tests, frequency of MD and staff rounds, including RN/MD rounding together at bedside, daily weights, and I &O's.  Patient instructed about prescribed diet, how to use 8MENU, and television.  Bed alarm in place, patient aware of placement and reason. Bed locked, in lowest position, side rails up 2/4, call light within reach.  Will continue to monitor.

## 2017-03-20 NOTE — Plan of Care (Signed)
Problem: Falls - Risk of  Goal: Absence of falls  Outcome: Ongoing  Pt remains free from fall and injury. Non-skid slippers on. Fall risk band on wrist, bed alarm in use. Instructed to call for assistance. Call light in reach, will monitor.      Problem: Risk for Impaired Skin Integrity  Goal: Tissue integrity - skin and mucous membranes  Structural intactness and normal physiological function of skin and  mucous membranes.   Outcome: Ongoing  Encouraging q2 turns, sacral heart dressing in place. Legs and heels elevated off bed. Will continue to monitor.

## 2017-03-21 LAB — CBC WITH AUTO DIFFERENTIAL
Basophils %: 0.2 %
Basophils Absolute: 0 10*3/uL (ref 0.0–0.2)
Eosinophils %: 1.9 %
Eosinophils Absolute: 0.3 10*3/uL (ref 0.0–0.6)
Hematocrit: 30.5 % — ABNORMAL LOW (ref 36.0–48.0)
Hemoglobin: 10 g/dL — ABNORMAL LOW (ref 12.0–16.0)
Lymphocytes %: 6 %
Lymphocytes Absolute: 0.9 10*3/uL — ABNORMAL LOW (ref 1.0–5.1)
MCH: 29.5 pg (ref 26.0–34.0)
MCHC: 32.7 g/dL (ref 31.0–36.0)
MCV: 90.2 fL (ref 80.0–100.0)
MPV: 12.6 fL — ABNORMAL HIGH (ref 5.0–10.5)
Monocytes %: 7.5 %
Monocytes Absolute: 1.1 10*3/uL (ref 0.0–1.3)
Neutrophils %: 84.4 %
Neutrophils Absolute: 12.4 10*3/uL — ABNORMAL HIGH (ref 1.7–7.7)
PLATELET SLIDE REVIEW: DECREASED
Platelets: 99 10*3/uL — ABNORMAL LOW (ref 135–450)
RBC: 3.38 M/uL — ABNORMAL LOW (ref 4.00–5.20)
RDW: 15.9 % — ABNORMAL HIGH (ref 12.4–15.4)
WBC: 14.6 10*3/uL — ABNORMAL HIGH (ref 4.0–11.0)

## 2017-03-21 LAB — BASIC METABOLIC PANEL W/ REFLEX TO MG FOR LOW K
Anion Gap: 11 (ref 3–16)
BUN: 16 mg/dL (ref 7–20)
CO2: 23 mmol/L (ref 21–32)
Calcium: 8.3 mg/dL (ref 8.3–10.6)
Chloride: 98 mmol/L — ABNORMAL LOW (ref 99–110)
Creatinine: 0.6 mg/dL (ref 0.6–1.2)
GFR African American: >60 (ref >60)
GFR Non-African American: >60 (ref >60)
Glucose: 112 mg/dL — ABNORMAL HIGH (ref 70–99)
Potassium reflex Magnesium: 3.8 mmol/L (ref 3.5–5.1)
Sodium: 132 mmol/L — ABNORMAL LOW (ref 136–145)

## 2017-03-21 LAB — CULTURE, URINE: Urine Culture, Routine: NO GROWTH

## 2017-03-21 MED ORDER — LEVOFLOXACIN IN D5W 500 MG/100ML IV SOLN
500 MG/100ML | INTRAVENOUS | Status: DC
Start: 2017-03-21 — End: 2017-03-22
  Administered 2017-03-21: 17:00:00 500 mg via INTRAVENOUS

## 2017-03-21 MED FILL — AZITHROMYCIN 500 MG IV SOLR: 500 MG | INTRAVENOUS | Qty: 500

## 2017-03-21 MED FILL — ASPIRIN LOW STRENGTH 81 MG PO CHEW: 81 MG | ORAL | Qty: 1

## 2017-03-21 MED FILL — ALPRAZOLAM 0.25 MG PO TABS: 0.25 MG | ORAL | Qty: 1

## 2017-03-21 MED FILL — LIDODERM 5 % EX PTCH: 5 % | CUTANEOUS | Qty: 1

## 2017-03-21 MED FILL — MONOJECT FLUSH SYRINGE 0.9 % IV SOLN: 0.9 % | INTRAVENOUS | Qty: 10

## 2017-03-21 MED FILL — VENLAFAXINE HCL 37.5 MG PO TABS: 37.5 MG | ORAL | Qty: 1

## 2017-03-21 MED FILL — LOVENOX 40 MG/0.4ML SC SOLN: 40 MG/0.4ML | SUBCUTANEOUS | Qty: 0.4

## 2017-03-21 MED FILL — LEVAQUIN 500 MG/100ML IV SOLN: 500 MG/100ML | INTRAVENOUS | Qty: 100

## 2017-03-21 NOTE — Plan of Care (Signed)
Problem: Falls - Risk of  Goal: Absence of falls  Outcome: Ongoing

## 2017-03-21 NOTE — Progress Notes (Signed)
Speech Language Pathology  Facility/Department: [facility] - MED SURG   BEDSIDE SWALLOW EVALUATION    NAME: Lauren Bryant  DOB: [birth year]  MRN: [id number]    ADMISSION DATE: 03/20/2017  ADMITTING DIAGNOSIS: has Pneumonia on her problem list.  ONSET DATE:  Admitted 03/20/17    Recent Chest Xray/CT of Chest: (03/20/17)  Impression   Nonspecific basilar reticular opacities. ??Findings may indicate atelectasis,   chronic scarring, or airspace disease.       Date of Eval: 03/21/2017  Evaluating Therapist: Odis Luster    Reason for Referral  Lauren Bryant was referred for a bedside swallow evaluation to assess the efficiency of her swallow function, identify signs and symptoms of aspiration and make recommendations regarding safe dietary consistencies, effective compensatory strategies, and safe eating environment.    Current Diet level:  Current Diet : Regular  Current Liquid Diet : Thin    Primary Complaint  No specific complaints from pt or daughter.  Pt's daughter, Lauren Bryant [facility]), was present throughout evaluation.  Per report, pt consumes a regular/TL diet at baseline.  No prior hx of dyspahgia.  No hx of recurring pneumonia.  Lauren Bryant reports pt has been a long time smoker, has baseline cough at this time as it relates to smoking.  Discussed briefly with RN - no reported difficulty with diet tolerance or med intake (whole with water).  Pt was admitted on a regular/TL diet.  Daughter Lauren Bryant denies any concerns with regards to chewing, swallowing, aspiration, or diet tolerance.     Pain:  Patient Currently in Pain: Denies    Impression  Dysphagia Diagnosis: Swallow function appears grossly intact (with consideration for pt's advanced age and presbyphagia)  Dysphagia Outcome Severity Scale: Level 6: Within functional limits/Modified independence   ?? Overall oral and pharyngeal phases of the swallow appear grossly WFL when considering pt's advanced age and presbyphagia.  Mastication was slightly prolonged with mild post-swallow lingual  residue, however, pt was able to clear residue with liquid wash.  Despite prolonged mastication, overall bolus control and AP transit judged to be adequate.  Swallow initiation mildly delayed, but appeared effective.  No overt s/s of aspiration observed throughout duration of trials completed today (regular, puree, TL cup, TL straw).  Vocal quality clear, no throat clearing.  It should be noted that pt presented with a baseline cough prior to, during, and after PO trials today.  Daughter stated pt has been a long time smoker and relates cough to smoking history.  Briefly discussed with RN.  Cough does not appear to  Be directly related to swallowing.   ?? Recommend cont with regular/ TL diet.  Meds whole with water as tolerated.  ST to follow 1-2x as able to ensure safe and consistent diet tolerance.  All recommendations, precautions, and findings were discussed with pt and daughter Lauren Bryant, [place]) at bedside - all in agreement.  Rn updated.     Treatment Plan  Requires SLP Intervention: Yes  Duration/Frequency of Treatment: 1-2x f/u to ensure consistent and safe diet tolerance.     Recommended Diet and Intervention  Diet Solids Recommendation: Regular  Liquid Consistency Recommendation: Thin  Recommended Form of Meds: Whole with water  Therapeutic Interventions: Diet tolerance monitoring, Therapeutic PO trials with SLP, Patient/Family education    Compensatory Swallowing Strategies  Compensatory Swallowing Strategies: Alternate solids and liquids;Eat/Feed slowly;Upright as possible for all oral intake;Remain upright for 30-45 minutes after meals;Small bites/sips    Treatment/Goals  Short-term Goals  Timeframe for Short-term  Goals: 3-5 Days  Goal 1: Pt will tolerate recommended diet without observed clinical signs of aspiration.   Goal 2: Pt / Caregiver will demonstrate understanding of compensatory strategies for improved swallowing safety during PO intake.   Long-term Goals  Timeframe for Long-term Goals: 5  Days  Goal 1: Pt will safely and consistently tolerate a least restrictive PO diet without difficulty or overt s/s of aspiration.     General  Chart Reviewed: Yes  Subjective  Subjective: Pt seated upright at EOB.  Daughter Lauren Bryant, [place]) present throughout duration of evaluation.  Able to assist with providing history.    Behavior/Cognition: Alert;Cooperative;Pleasant mood;Confused  Respiratory Status: Room air  Communication Observation: Functional (Hard of hearing)  Follows Directions: Simple  Dentition: Dentures top;Dentures bottom (Full upper, partial lower)  Patient Positioning: Upright in bed (Seated EOB)  Baseline Vocal Quality: Normal  Volitional Cough: Strong (Pt has strong baseline cough, daughter reports it is from being a long time smoker and not having a cigarette over the past couple days.)  Prior Dysphagia History: None reported per pt or daughter / POA.  Consistencies Administered: Puree;Reg solid;Thin - cup;Thin - straw    Vision/Hearing  Vision  Vision: Impaired  Vision Exceptions: Wears glasses at all times  Hearing  Hearing: Ex[facility] to [facility]  Hearing Exceptions: Left hearing aid    Oral Motor Deficits  Oral/Motor  Oral Motor: Exceptions to [facility]  Lingual Symmetry: Abnormal symmetry left (Mild L side deviation upon protrusion)    Oral Phase Dysfunction  Oral phase of swallow grossly appears WNL.  No oral phase deficits noted during evaluation when considering pt's advanced age.     Indicators of Pharyngeal Phase Dysfunction  Pharyngeal phase of swallow appears grossly WNL. No pharyngeal deficits noted during evaluation.  Laryngeal elevation appears WFL based upon palpation of anterior neck during swallow when considering advanced age and presbyphagia.   No overt s/s of aspiration observed as directly r/t PO intake.   Vocal quality dry before and after po trials.    Prognosis  Prognosis  Prognosis for safe diet advancement: good  Barriers to reach goals: age  Individuals consulted  Consulted and  agree with results and recommendations: Patient;Family member  Family member consulted: Daughter/POA Lauren Bryant)    Education  Patient Education: Education provided to pt and daughter re: diet recommendations, safe swallowing precautions and POC for speech therapy.  Handout provided, reviewed, left with pt and daughter at bedside.   Patient Education Response: Verbalizes understanding;Needs reinforcement    G-Code  SLP G-Codes  Functional Limitations: Swallowing  Swallow Current Status [phone](G8996): At least 1 percent but less than 20 percent impaired, limited or restricted  Swallow Goal Status [phone](G8997): 0 percent impaired, limited or restricted    Therapy Time  SLP Individual Minutes  Time In: 1258  Time Out: 1321  Minutes: 23  Dysphagia Eval / Dysphagia Tx    Odis LusterLyndsey Leela Vanbrocklin, MS CCC-SLP [phone]  Speech-Language Pathologist

## 2017-03-21 NOTE — Progress Notes (Signed)
Pt sitting up side of bed getting ready to eat breakfast with daughter, resp easy, IV patent, assessment completed, pt denies pain or needs at this time, call light in reach, bed alarm active, will monitor.

## 2017-03-21 NOTE — Progress Notes (Signed)
Speech Language Pathology  SPEECH NOTE: MD orders read: Speech Language Pathology clinical swallow screening [SLP7] ?? ?? (Order #: [id number]). The patient has reduction to hearing function, increased confusion, trying to get out of bed, d/w the PCA. The patient has reduced ability to provide subjective information regarding her dysphagia symptoms. The patient had 03-20-17 CXR per MD reporting non-specific basilar reticular opacities. She reports that she has a left side hearing aid no present. She is on a regular consistency diet. She has 03-20-17 WBC 20.8. Bedside swallow evaluation follow up is pending. Screening completed time 0750-0800. No charges.   Jousha Schwandt CCC-SLP.D [facility]  03-21-17

## 2017-03-21 NOTE — Care Coordination-Inpatient (Signed)
CASE MANAGEMENT INITIAL ASSESSMENT    Reviewed chart and met with patient today, re: DCP  Explained Case Management role/services.     Family present: dtr  Primary contact information: Natasha Mead, dtr/POA [phone])    Admit date/status: 03/20/17, IPA  Diagnosis: PNA    Insurance: Medicare  Precert required for SNF - N        3 night stay required - Y    Living arrangements, Adls, care needs, prior to admission: visiting dtr here in [facility] (home alone in [place]), ADLs- some assistance from dtr    Transportation at discharge: dtr     Durable Medical Equipment at home: Walker__Cane_x_RTS__ BSC__Shower Chair__02__ HHN__ CPAP__  BiPap__  Hospital Bed__ W/C___ Other__________    Services in the home and/or outpatient, prior to admission: none    PT/OT recs: no needs identified or ordered    Hospital Exemption Notification (HEN): n/a    Barriers to discharge: none currently identified    Plan/comments: CM met with pt and pt's dtr at floor bs to assess DCP needs. Pt and pt's dtr declines any HHC needs and do not identify any other current DCP needs.     Pt currently ordered IV ABX. CM will continue to follow for poss IV ABX at dc.    ECOC on chart for MD signature  Ann Lions, CCM

## 2017-03-21 NOTE — Plan of Care (Signed)
Problem: Nutrition  Intervention: Swallowing evaluation  Bedside swallow evaluation completed this date. All further notes may be found in EMR.    Konni Kesinger, MS CCC-SLP #[id number]  Speech-Language Pathologist    Intervention: Aspiration precautions  Bedside swallow evaluation completed this date. All further notes may be found in EMR.    Reene Harlacher, MS CCC-SLP #[id number]  Speech-Language Pathologist

## 2017-03-21 NOTE — Plan of Care (Signed)
Problem: Falls - Risk of  Goal: Absence of falls  Pt remains free from falls, up with assistance only, call light in reach, bed alarm active, will monitor.

## 2017-03-21 NOTE — Progress Notes (Signed)
Hospitalist Progress Note      PCP: No primary care provider on file.    Date of Admission: 03/20/2017    Chief Complaint: fever and weakness    Hospital Course: 83 y.o. [facility] and [facility]. Pt's family brought her to the ED because she was confused. She was having fevers today and was weaker than normal. She reportedly gets frequent UTIs. In ED, she was found to have pneumonia and was febrile. She was started on Abx. She is a poor historian.     Subjective: Feeling better. Has a diffuse rash. Tol PO.     Medications:  Reviewed    Infusion Medications   Scheduled Medications   ??? levofloxacin  500 mg Intravenous Q24H   ??? aspirin  81 mg Oral Daily   ??? lidocaine  1 patch Transdermal Daily   ??? trospium  20 mg Oral Nightly   ??? venlafaxine  37.5 mg Oral Daily   ??? sodium chloride flush  10 mL Intravenous 2 times per day   ??? enoxaparin  40 mg Subcutaneous Daily     PRN Meds: ALPRAZolam, sodium chloride flush, acetaminophen, magnesium hydroxide, ondansetron, albuterol      Intake/Output Summary (Last 24 hours) at 03/21/17 1512  Last data filed at 03/21/17 1309   Gross per 24 hour   Intake              240 ml   Output              200 ml   Net               40 ml       Physical Exam Performed:    BP 128/65    Pulse 117    Temp 98.9 ??F (37.2 ??C) (Oral)    Resp 18    Ht 5\' 5"  (1.651 m)    Wt 137 lb (62.1 kg)    SpO2 93%    BMI 22.80 kg/m??     General appearance: No apparent distress, appears stated age and cooperative.  HEENT: Pupils equal, round, and reactive to light. Conjunctivae/corneas clear.  Neck: Supple, with full range of motion. No jugular venous distention. Trachea midline.  Respiratory:  Normal respiratory effort. Clear to auscultation, bilaterally without Rales/Wheezes/Rhonchi.  Cardiovascular: Regular rate and rhythm with normal S1/S2 without murmurs, rubs or gallops.  Abdomen: Soft, non-tender, non-distended with normal bowel sounds.  Musculoskeletal: No clubbing,  cyanosis or edema bilaterally.  Full range of motion without deformity.  Skin: diffuse maculopapular papules, waist down.  Neurologic:  Neurovascularly intact without any focal sensory/motor deficits. Cranial nerves: II-XII intact, grossly non-focal.  Psychiatric: Alert and oriented, thought content appropriate, normal insight  Capillary Refill: Brisk,< 3 seconds   Peripheral Pulses: +2 palpable, equal bilaterally       Labs:   Recent Labs      03/20/17   1220  03/21/17   0651   WBC  20.8*  14.6*   HGB  12.2  10.0*   HCT  36.9  30.5*   PLT  137  99*     Recent Labs      03/20/17   1220  03/21/17   0651   NA  136  132*   K  4.3  3.8   CL  96*  98*   CO2  26  23   BUN  16  16   CREATININE  0.8  0.6  CALCIUM  9.1  8.3     Recent Labs      03/20/17   1220   AST  21   ALT  10   BILITOT  1.3*   ALKPHOS  58     Recent Labs      03/20/17   1220   INR  1.21*     Recent Labs      03/20/17   1220   TROPONINI  <0.01       Urinalysis:    Lab Results   Component Value Date    NITRU Negative 03/20/2017    BLOODU Negative 03/20/2017    SPECGRAV 1.015 03/20/2017    GLUCOSEU Negative 03/20/2017       Radiology:  XR CHEST PORTABLE   Final Result   Nonspecific basilar reticular opacities.  Findings may indicate atelectasis,   chronic scarring, or airspace disease.                 Assessment/Plan:    Active Hospital Problems    Diagnosis Date Noted   ??? Pneumonia [J18.9] 03/20/2017     83 yo female with h/o HLP, anxiety, frequent UTIs admitted with CAP.   ??  PLAN:    Sepsis, POA in the setting of temp 102, heart rate 100, WBC 20.8 and pneumonia being treated with IV Rocephin, azithromycin 2 L IV bolus and Tylenol  ??  CAP - started on Rocephin and Azithromycin on 3/27. Blood Cx pending. CXR with bibasilar opacities. Fever, leukocytosis, confusion on admission.  ??  Anxiety - continue home xanax prn in moderation as with confusion.  ??  HLP - continue home statin.  ??  Acute delirium - confusion likely due to infection.  Supportive care and  treat infection.  ??  DVT Prophylaxis: lovenox  Diet: DIET GENERAL;  Dietary Nutrition Supplements: Standard High Calorie Oral Supplement  Code Status: Full Code    PT/OT Eval Status: baseline    Dispo - home tomorrow    Beryle Flock, MD

## 2017-03-21 NOTE — Progress Notes (Signed)
Nutrition Assessment    Type and Reason for Visit: Initial, Positive Nutrition Screen    Nutrition Recommendations:   Continue diet free of therapeutic restrictions   Trial Ensure BID  Will monitor nutritional adequacy, nutrition-related labs, weights, BMs, and clinical progress     Malnutrition Assessment:  ?? Malnutrition Status: Insufficient data    Nutrition Diagnosis:   ?? Problem: Inadequate oral intake  ?? Etiology: related to Insufficient energy/nutrient consumption    ??? Signs and symptoms:  as evidenced by Diet history of poor intake (per nsg screen )    Nutrition Assessment:  ?? Subjective Assessment: + Screen for malnutrition score of 2 and RD consult needed.  Pt presented with fever and weakness, found to have CAP.  Working with therapy during visit.  Per EMR, weights have been stable though limited weights available for review.  Currently on General diet, SLP eval complete.    ?? Nutrition-Focused Physical Findings: Considered underweight for age   ?? Wound Type: None (red areas )  ?? Current Nutrition Therapies:  ?? Oral Diet Orders: General   ?? Oral Diet intake: 1-25%  ?? Oral Nutrition Supplement (ONS) Orders: None  ?? Anthropometric Measures:  ?? Ht: 5\' 5"  (165.1 cm)   ?? Current Body Wt: 137 lb (62.1 kg)  ?? Usual Body Wt: 137 lb (62.1 kg)  ?? Ideal Body Wt: 125 lb (56.7 kg),   ?? BMI Classification: BMI 18.5 - 24.9 Normal Weight    Estimated Intake vs Estimated Needs: Intake Less Than Needs    Nutrition Risk Level: Moderate    Nutrition Interventions:   Continue current diet, Start ONS  Continued Inpatient Monitoring    Nutrition Evaluation:   ?? Evaluation: Goals set   ?? Goals: Tolerate diet and consume greater than 50% of meals and supplements     ?? Monitoring: Meal Intake, Supplement Intake, Diet Tolerance, Weight    See Adult Nutrition Doc Flowsheet for more detail.     Electronically signed by Yetta BarreSara Niguel Moure, RD, LD on 03/21/17 at 2:06 PM    Contact Number: [phone]

## 2017-03-21 NOTE — Consults (Signed)
Pharmacy consulted for renal dosing  Levaquin 500 mg daily Renal adjustment    Recent Labs      03/20/17   1220  03/21/17   0651   CREATININE  0.8  0.6     Estimated Creatinine Clearance: 55 mL/min (based on SCr of 0.6 mg/dL).    Clcr 20-49 mL/minute: Administer 500 mg initial dose, followed by 250 mg every 24 hours.    Clcr 10-19 mL/minute: Administer 500 mg initial dose, followed by 250 mg every 48 hours.    Will continue with 500 mg daily for now as CRCL above 50 ml/min.  Will continue to monitor as will need dose reduction if SCR rises.    Lauren Bryant, PharmD 11:38 AM 03/21/17

## 2017-03-21 NOTE — Plan of Care (Signed)
Problem: Nutrition  Goal: Optimal nutrition therapy  Outcome: Ongoing  Nutrition Problem: Inadequate oral intake  Intervention: Food and/or Nutrient Delivery: Continue current diet, Start ONS  Nutritional Goals: Tolerate diet and consume greater than 50% of meals and supplements

## 2017-03-22 LAB — CBC WITH AUTO DIFFERENTIAL
Basophils %: 0.1 %
Basophils Absolute: 0 10*3/uL (ref 0.0–0.2)
Eosinophils %: 5.5 %
Eosinophils Absolute: 0.4 10*3/uL (ref 0.0–0.6)
Hematocrit: 32.8 % — ABNORMAL LOW (ref 36.0–48.0)
Hemoglobin: 10.9 g/dL — ABNORMAL LOW (ref 12.0–16.0)
Lymphocytes %: 12.7 %
Lymphocytes Absolute: 0.9 10*3/uL — ABNORMAL LOW (ref 1.0–5.1)
MCH: 29.7 pg (ref 26.0–34.0)
MCHC: 33.3 g/dL (ref 31.0–36.0)
MCV: 89.2 fL (ref 80.0–100.0)
MPV: 11.7 fL — ABNORMAL HIGH (ref 5.0–10.5)
Monocytes %: 9.1 %
Monocytes Absolute: 0.7 10*3/uL (ref 0.0–1.3)
Neutrophils %: 72.6 %
Neutrophils Absolute: 5.4 10*3/uL (ref 1.7–7.7)
Platelets: 121 10*3/uL — ABNORMAL LOW (ref 135–450)
RBC: 3.68 M/uL — ABNORMAL LOW (ref 4.00–5.20)
RDW: 15.3 % (ref 12.4–15.4)
WBC: 7.4 10*3/uL (ref 4.0–11.0)

## 2017-03-22 LAB — BASIC METABOLIC PANEL
Anion Gap: 12 (ref 3–16)
BUN: 10 mg/dL (ref 7–20)
CO2: 26 mmol/L (ref 21–32)
Calcium: 9 mg/dL (ref 8.3–10.6)
Chloride: 97 mmol/L — ABNORMAL LOW (ref 99–110)
Creatinine: 0.6 mg/dL (ref 0.6–1.2)
GFR African American: >60 (ref >60)
GFR Non-African American: >60 (ref >60)
Glucose: 114 mg/dL — ABNORMAL HIGH (ref 70–99)
Potassium: 3.9 mmol/L (ref 3.5–5.1)
Sodium: 135 mmol/L — ABNORMAL LOW (ref 136–145)

## 2017-03-22 LAB — POCT GLUCOSE: POC Glucose: 103 mg/dl — ABNORMAL HIGH (ref 70–99)

## 2017-03-22 MED ORDER — PREDNISONE 10 MG PO TABS
10 MG | ORAL_TABLET | Freq: Every day | ORAL | 0 refills | Status: AC
Start: 2017-03-22 — End: 2017-03-26

## 2017-03-22 MED ORDER — LEVOFLOXACIN 250 MG PO TABS
250 MG | ORAL_TABLET | Freq: Every day | ORAL | 0 refills | Status: AC
Start: 2017-03-22 — End: 2017-03-26

## 2017-03-22 MED FILL — LOVENOX 40 MG/0.4ML SC SOLN: 40 MG/0.4ML | SUBCUTANEOUS | Qty: 0.4

## 2017-03-22 MED FILL — MONOJECT FLUSH SYRINGE 0.9 % IV SOLN: 0.9 % | INTRAVENOUS | Qty: 10

## 2017-03-22 MED FILL — SANCTURA 20 MG PO TABS: 20 MG | ORAL | Qty: 1

## 2017-03-22 NOTE — Care Coordination-Inpatient (Signed)
CM notified by MD, pt ordered PO ABX at dc. No other current DCP needs identified.  Crecencio Mc, LISW, CCM

## 2017-03-22 NOTE — Progress Notes (Signed)
Culture result reviewed, no further treatment needed

## 2017-03-22 NOTE — Discharge Summary (Signed)
Hospital Medicine Discharge Summary    Patient ID: Lauren Bryant      Patient's PCP: No primary care provider on file.    Admit Date: 03/20/2017     Discharge Date:   03/22/18    Admitting Physician: Beryle FlockJessica L Anntionette Madkins, MD     Discharge Physician: Beryle FlockJessica L Katrece Roediger, MD     Discharge Diagnoses:       Active Hospital Problems    Diagnosis Date Noted   ??? Pneumonia [J18.9] 03/20/2017       The patient was seen and examined on day of discharge and this discharge summary is in conjunction with any daily progress note from day of discharge.    Hospital Course:     83 yo female with h/o HLP, anxiety, frequent UTIs admitted with CAP.   ??  PLAN:  ??  Sepsis, POA in the setting of temp 102, heart rate 100, WBC 20.8 and pneumonia being treated with IV Rocephin, azithromycin 2 L IV bolus and Tylenol. Resolved.  ??  CAP - started on Rocephin and Azithromycin on 3/27 and changed to levaquin as pt had a rash. Blood Cx neg. CXR with bibasilar opacities. Fever, leukocytosis, confusion on admission. Now improved. Will DC on PO levaquin to complete a five day course.  ??  Anxiety - continued home xanax prn in moderation as with confusion.  ??  HLP - continued home statin.  ??  Acute delirium - confusion likely due to infection. ??Supportive care and treat infection. Resolved.    Rash - likely drug eruption from macrobid. Low dose steroids for a few days. Should be self limiting. Added to allergy list.   ??    Physical Exam Performed:     BP (!) 95/55    Pulse 105    Temp 97.9 ??F (36.6 ??C) (Oral)    Resp 16    Ht 5\' 5"  (1.651 m)    Wt 137 lb (62.1 kg)    SpO2 94%    BMI 22.80 kg/m??       General appearance:  No apparent distress, appears stated age and cooperative.  HEENT:  Normal cephalic, atraumatic without obvious deformity. Pupils equal, round, and reactive to light.  Extra ocular muscles intact. HOH. Conjunctivae/corneas clear.  Neck: Supple, with full range of motion. No jugular venous distention. Trachea midline.  Respiratory:  Normal  respiratory effort. Clear to auscultation, bilaterally without Rales/Wheezes/Rhonchi.  Cardiovascular:  Regular rate and rhythm with normal S1/S2 without murmurs, rubs or gallops.  Abdomen: Soft, non-tender, non-distended with normal bowel sounds.  Musculoskeletal:  No clubbing, cyanosis or edema bilaterally.  Full range of motion without deformity.  Skin: Skin color, texture, turgor normal.  No rashes or lesions.  Neurologic:  Neurovascularly intact without any focal sensory/motor deficits. Cranial nerves: II-XII intact, grossly non-focal.  Psychiatric:  Alert and oriented, thought content appropriate, normal insight  Capillary Refill: Brisk,< 3 seconds   Peripheral Pulses: +2 palpable, equal bilaterally       Labs: For convenience and continuity at follow-up the following most recent labs are provided:      CBC:    Lab Results   Component Value Date    WBC 7.4 03/22/2017    HGB 10.9 03/22/2017    HCT 32.8 03/22/2017    PLT 121 03/22/2017       Renal:    Lab Results   Component Value Date    NA 135 03/22/2017    K 3.9 03/22/2017  K 3.8 03/21/2017    CL 97 03/22/2017    CO2 26 03/22/2017    BUN 10 03/22/2017    CREATININE 0.6 03/22/2017    CALCIUM 9.0 03/22/2017         Significant Diagnostic Studies    Radiology:   XR CHEST PORTABLE   Final Result   Nonspecific basilar reticular opacities.  Findings may indicate atelectasis,   chronic scarring, or airspace disease.                Consults:     IP CONSULT TO HOSPITALIST  IP CONSULT TO SPIRITUAL SERVICES  IP CONSULT TO PHARMACY    Disposition:  Home with daughter.    Condition at Discharge: Stable    Discharge Instructions/Follow-up:  PCP    Code Status:  Full Code     Activity: activity as tolerated    Diet: regular diet      Discharge Medications:     Current Discharge Medication List           Details   predniSONE (DELTASONE) 10 MG tablet Take 1 tablet by mouth daily for 4 days  Qty: 4 tablet, Refills: 0      levofloxacin (LEVAQUIN) 250 MG tablet Take 1 tablet  by mouth daily for 4 days  Qty: 4 tablet, Refills: 0              Details   venlafaxine (EFFEXOR) 37.5 MG tablet Take 37.5 mg by mouth daily      simvastatin (ZOCOR) 20 MG tablet Take 20 mg by mouth nightly      tolterodine (DETROL LA) 4 MG ER capsule Take 4 mg by mouth daily      aspirin 81 MG tablet Take 81 mg by mouth daily      ALPRAZolam (XANAX) 0.25 MG tablet Take 0.25 mg by mouth 3 times daily as needed for Sleep             Time Spent on discharge is more than 30 minutes in the examination, evaluation, counseling and review of medications and discharge plan.      Signed:    Beryle Flock, MD   03/22/2017      Thank you No primary care provider on file. for the opportunity to be involved in this patient's care. If you have any questions or concerns please feel free to contact me at [phone].

## 2017-03-22 NOTE — Progress Notes (Signed)
Pt has moved out of bed twice without calling out for assistance and is too quick to catch her before she gets out of bed. This RN reinforced to the patient to use her call light, but she does not remember to use it. Pt is a fall risk because of confusion and she is supposed to use a cane. Called [employer] for camera. Non available. Requested next available unit. Will continue to monitor.

## 2017-03-22 NOTE — Discharge Instructions (Signed)
D/C Summary - Swallowing recommendations by SLP  Evaluation completed on: 03/21/17  Diet recommendation: Regular solids with thin liquids  Response to education:  Education to pt and daughter re: diet recommendations and safe swallowing precautions.  Handout provided, reviewed, left with pt at bedside.  Verbalized understanding.   Follow up recommended after d/c: Pending diet tolerance, do not anticipate pt will need continued speech therapy on d/c.

## 2017-03-22 NOTE — Progress Notes (Signed)
Pt ok for discharge per MD. Discharge instructions and script given. IV removed.  No questions or concerns at this time. Transported by wheelchair to personal car.

## 2017-03-22 NOTE — Discharge Instructions (Signed)
As tolerated

## 2017-03-22 NOTE — Plan of Care (Signed)
Problem: Falls - Risk of  Goal: Absence of falls  Outcome: Ongoing  .Pt remains free from fall and injury. Non-skid slippers on. Fall risk band on wrist, bed alarm in use. Instructed to call for assistance. Call light in reach, will monitor.

## 2017-03-25 LAB — CULTURE, BLOOD 2: Culture, Blood 2: NO GROWTH

## 2017-03-25 LAB — CULTURE BLOOD #1: Blood Culture, Routine: NO GROWTH

## 2017-03-27 LAB — EKG 12-LEAD
Atrial Rate: 102 {beats}/min
P Axis: 37 degrees
P-R Interval: 130 ms
Q-T Interval: 380 ms
QRS Duration: 114 ms
QTc Calculation (Bazett): 495 ms
R Axis: 93 degrees
T Axis: 28 degrees
Ventricular Rate: 102 {beats}/min

## 2017-04-02 ENCOUNTER — Inpatient Hospital Stay: Payer: Medicare Other

## 2017-04-02 ENCOUNTER — Encounter: Payer: Self-pay | Admitting: Hematology and Oncology

## 2017-04-02 ENCOUNTER — Inpatient Hospital Stay: Payer: Medicare Other | Attending: Hematology and Oncology | Admitting: Hematology and Oncology

## 2017-04-02 VITALS — BP 118/66 | HR 84 | Temp 97.1°F | Resp 18 | Ht 65.0 in | Wt 125.1 lb

## 2017-04-02 DIAGNOSIS — F17210 Nicotine dependence, cigarettes, uncomplicated: Secondary | ICD-10-CM

## 2017-04-02 DIAGNOSIS — Z79899 Other long term (current) drug therapy: Secondary | ICD-10-CM

## 2017-04-02 DIAGNOSIS — D649 Anemia, unspecified: Secondary | ICD-10-CM

## 2017-04-02 LAB — IRON AND TIBC
Iron: 43 ug/dL (ref 28–170)
Saturation Ratios: 12 % (ref 10.4–31.8)
TIBC: 367 ug/dL (ref 250–450)
UIBC: 324 ug/dL

## 2017-04-02 LAB — CBC WITH DIFFERENTIAL/PLATELET
Basophils Absolute: 0.1 10*3/uL (ref 0–0.1)
Basophils Relative: 1 %
Eosinophils Absolute: 0.6 10*3/uL (ref 0–0.7)
Eosinophils Relative: 6 %
HCT: 32 % — ABNORMAL LOW (ref 35.0–47.0)
Hemoglobin: 10.6 g/dL — ABNORMAL LOW (ref 12.0–16.0)
Lymphocytes Relative: 13 %
Lymphs Abs: 1.3 10*3/uL (ref 1.0–3.6)
MCH: 29.7 pg (ref 26.0–34.0)
MCHC: 33.2 g/dL (ref 32.0–36.0)
MCV: 89.4 fL (ref 80.0–100.0)
Monocytes Absolute: 0.8 10*3/uL (ref 0.2–0.9)
Monocytes Relative: 8 %
Neutro Abs: 7.1 10*3/uL — ABNORMAL HIGH (ref 1.4–6.5)
Neutrophils Relative %: 72 %
Platelets: 193 10*3/uL (ref 150–440)
RBC: 3.58 MIL/uL — ABNORMAL LOW (ref 3.80–5.20)
RDW: 16 % — ABNORMAL HIGH (ref 11.5–14.5)
WBC: 9.9 10*3/uL (ref 3.6–11.0)

## 2017-04-02 LAB — FERRITIN: Ferritin: 61 ng/mL (ref 11–307)

## 2017-04-02 LAB — RETICULOCYTES
RBC.: 3.58 MIL/uL — ABNORMAL LOW (ref 3.80–5.20)
Retic Count, Absolute: 50.1 10*3/uL (ref 19.0–183.0)
Retic Ct Pct: 1.4 % (ref 0.4–3.1)

## 2017-04-02 LAB — VITAMIN B12: Vitamin B-12: 1346 pg/mL — ABNORMAL HIGH (ref 180–914)

## 2017-04-02 LAB — FOLATE: Folate: 57.7 ng/mL (ref >5.9)

## 2017-04-02 LAB — SEDIMENTATION RATE: Sed Rate: 52 mm/hr — ABNORMAL HIGH (ref 0–30)

## 2017-04-02 NOTE — Progress Notes (Signed)
Patient here today as new evaluation regarding anemia. Referred by Dr.Hedrick. 

## 2017-04-02 NOTE — Progress Notes (Signed)
[facility]  Clinic day:  04/02/2017  Chief Complaint: Linda Owens is a 83 y.o. female with anemia who is referred in consultation by Dr. Jerl Mina for assessment and management.  HPI:  The patient notes "iron low" 1 year ago.  She took over the counter oral iron.  Iron levels came up "for awhile" then were "rechecked and were low".    Her diet is good.  She has a hot lunch at noon.  She eats meat regularly.  She denies pica.  She notes negative guaiac cards in 02/2017.  She had a colonoscopy in 2000 which was "good".  She is on no new medications or herbal products.  CBC on 04/24/2016 included a hematocrit 34.3, hemoglobin 10.8, MCV 90.3, platelets 145,000 and white count 6400. CBC on 01/31/2017 included a hematocrit 31.7, hemoglobin 10.7, MCV 89.8, platelets 211,000, white count 13,600, and ANC of 10,370. Differential was unremarkable.  Comprehensive metabolic panel on 02/20/2017 included a creatinine of 1.0, calcium 8.6, albumin 3.5 and normal liver function tests. Urinalysis on 02/20/2017 revealed moderate blood, nitrite positive and large leukocyte esterase positive.   She has been on over the counter iron 1 tablet a day since 02/23/2016.  Symptomatically, she feels dizzy when she gets up.  She denies any melena, hematochezia, hematuria or vaginal bleeding.  She notes 2 infections (UTI and a lung infection/flu).  She denies any B symptoms.  She has lost 2 pounds this year.     Past Medical History:  Diagnosis Date  . Allergy   . Cataract     Past Surgical History:  Procedure Laterality Date  . ABDOMINAL HYSTERECTOMY    . COLONOSCOPY  2003    Family History  Problem Relation Age of Onset  . Cancer Sister     Social History:  reports that she has been smoking.  She has a 12.50 pack-year smoking history. She does not have any smokeless tobacco history on file. She reports that she does not drink alcohol. Her drug history is not on file.  She  lives independently in [place].  They provide a hot lunch at noon. The patient is accompanied by her daughter, Kendal Hymen, today.  Allergies:  Allergies  Allergen Reactions  . Nitrofurantoin Rash    Current Medications: Current Outpatient Prescriptions  Medication Sig Dispense Refill  . ALPRAZolam (XANAX) 0.25 MG tablet Take 0.25 mg by mouth at bedtime as needed for anxiety.    Marland Kitchen aspirin 325 MG tablet Take 325 mg by mouth daily.    . Misc Natural Products (OSTEO BI-FLEX JOINT SHIELD PO) Take by mouth.    . Multiple Vitamins-Minerals (MACULAR VITAMIN BENEFIT PO) Take by mouth.    . simvastatin (ZOCOR) 20 MG tablet Take 20 mg by mouth daily.    Marland Kitchen tolterodine (DETROL LA) 4 MG 24 hr capsule Take 4 mg by mouth daily.    Marland Kitchen venlafaxine (EFFEXOR) 37.5 MG tablet Take 37.5 mg by mouth 2 (two) times daily.    . ferrous sulfate 325 (65 FE) MG EC tablet Take 65 mg by mouth daily.     No current facility-administered medications for this visit.     Review of Systems:  GENERAL:  Feels "ok".  No fevers or sweats.  Weight loss of 2 pounds in the past year. PERFORMANCE STATUS (ECOG):  1 HEENT:  Decreased hearing.  Can only hear out of left ear.  No visual changes, runny nose, sore throat, mouth sores or tenderness.  Lungs: No shortness of breath or cough.  No hemoptysis. Cardiac:  No chest pain, palpitations, orthopnea, or PND. GI:  No nausea, vomiting, diarrhea, constipation, melena or hematochezia. GU:  No urgency, frequency, dysuria, or hematuria. Musculoskeletal:  No back pain.  No joint pain.  No muscle tenderness. Extremities:  No pain or swelling. Skin:  No rashes or skin changes. Neuro:  Dizzy when stands.  No headache, numbness or weakness, balance or coordination issues. Endocrine:  No diabetes, thyroid issues, hot flashes or night sweats. Psych:  No mood changes, depression or anxiety. Pain:  No focal pain. Review of systems:  All other systems reviewed and found to be  negative.  Physical Exam: Blood pressure 118/66, pulse 84, temperature 97.1 F (36.2 C), temperature source Tympanic, resp. rate 18, height  (1.651 m), weight 125 lb 2 oz (56.8 kg). GENERAL:  Thin elderly woman sitting comfortably in the exam room in no acute distress.  Needs assistance onto table. MENTAL STATUS:  Alert and oriented to person, place and time. HEAD:  Curly gray hair.  Normocephalic, atraumatic, face symmetric, no Cushingoid features. EYES:  Glasses.  Blue eyes.  Pupils equal round and reactive to light and accomodation.  No conjunctivitis or scleral icterus. ENT:  Oropharynx clear without lesion.  Tongue normal.  Dentures.  Mucous membranes moist.  RESPIRATORY:  Clear to auscultation without rales, wheezes or rhonchi. CARDIOVASCULAR:  Regular rate and rhythm without murmur, rub or gallop. ABDOMEN:  Soft, non-tender, with active bowel sounds, and no hepatosplenomegaly.  No masses. SKIN:  No rashes, ulcers or lesions. EXTREMITIES: No edema, no skin discoloration or tenderness.  No palpable cords. LYMPH NODES: No palpable cervical, supraclavicular, axillary or inguinal adenopathy  NEUROLOGICAL: Unremarkable. PSYCH:  Appropriate.   No visits with results within 3 Day(s) from this visit.  Latest known visit with results is:  No results found for any previous visit.    Assessment:  Linda FALEY is a 83 y.o. female with a mild progressive normocytic anemia since 04/2016.  Diet is good.  Guaiac cards were negative in 02/2017.  She had a colonoscopy in 2000 which was "good".  She is on no new medications or herbal products.  She has been on oral iron since 02/23/2016.  CBC on 01/31/2017 included a hematocrit 31.7, hemoglobin 10.7, MCV 89.8, platelets 211,000, white count 13,600, and ANC of 10,370. Differential was unremarkable.  Comprehensive metabolic panel included a creatinine of 1.0, calcium 8.6, albumin 3.5 and normal liver function tests. Urinalysis on 02/20/2017 revealed  moderate blood.   Symptomatically, she feels dizzy when she gets up.  She denies any melena, hematochezia, hematuria or vaginal bleeding.  She denies any B symptoms.  Exam is unremarkable.  Plan: 1. Discuss differential diagnosis of anemia.  Diet appears good.  She denies any bleeding.  Discuss laboratory evaluation.  2. Labs today:  CBC with diff, retic, ferritin, iron studies, sed rate, SPEP, free light chain assay, B12, folate. 3. Guaiac cards x 3 4. RTC in 1 week for review of work-up.   Rosey Bath, MD  04/02/2017, 4:17 PM

## 2017-04-03 ENCOUNTER — Ambulatory Visit: Payer: Medicare Other

## 2017-04-03 LAB — KAPPA/LAMBDA LIGHT CHAINS
Kappa free light chain: 18.5 mg/L (ref 3.3–19.4)
Kappa, lambda light chain ratio: 1.23 (ref 0.26–1.65)
Lambda free light chains: 15 mg/L (ref 5.7–26.3)

## 2017-04-04 DIAGNOSIS — D649 Anemia, unspecified: Secondary | ICD-10-CM | POA: Diagnosis not present

## 2017-04-04 LAB — MULTIPLE MYELOMA PANEL, SERUM
Albumin SerPl Elph-Mcnc: 3.8 g/dL (ref 2.9–4.4)
Albumin/Glob SerPl: 1.5 (ref 0.7–1.7)
Alpha 1: 0.2 g/dL (ref 0.0–0.4)
Alpha2 Glob SerPl Elph-Mcnc: 0.8 g/dL (ref 0.4–1.0)
B-Globulin SerPl Elph-Mcnc: 0.9 g/dL (ref 0.7–1.3)
Gamma Glob SerPl Elph-Mcnc: 0.6 g/dL (ref 0.4–1.8)
Globulin, Total: 2.6 g/dL (ref 2.2–3.9)
IgA: 162 mg/dL (ref 64–422)
IgG (Immunoglobin G), Serum: 569 mg/dL — ABNORMAL LOW (ref 700–1600)
IgM, Serum: 123 mg/dL (ref 26–217)
Total Protein ELP: 6.4 g/dL (ref 6.0–8.5)

## 2017-04-05 DIAGNOSIS — D649 Anemia, unspecified: Secondary | ICD-10-CM | POA: Diagnosis not present

## 2017-04-06 DIAGNOSIS — D649 Anemia, unspecified: Secondary | ICD-10-CM | POA: Diagnosis not present

## 2017-04-09 ENCOUNTER — Other Ambulatory Visit: Payer: Self-pay | Admitting: *Deleted

## 2017-04-09 DIAGNOSIS — D649 Anemia, unspecified: Secondary | ICD-10-CM

## 2017-04-09 LAB — OCCULT BLOOD X 1 CARD TO LAB, STOOL
Fecal Occult Bld: POSITIVE — AB
Fecal Occult Bld: POSITIVE — AB
Fecal Occult Bld: POSITIVE — AB

## 2017-04-10 ENCOUNTER — Telehealth: Payer: Self-pay | Admitting: *Deleted

## 2017-04-10 ENCOUNTER — Ambulatory Visit: Payer: Medicare Other | Admitting: Hematology and Oncology

## 2017-04-10 NOTE — Telephone Encounter (Signed)
Called patient to inform her that all of her guaiac were positive and to inquire if she has a gastroenterologist?  She states she has not established with anyone since moving her from [place].  She is coming in next week with her daughter and will discuss with MD at that time.

## 2017-04-10 NOTE — Telephone Encounter (Signed)
-----   Message from Rosey Bath, MD sent at 04/10/2017  8:13 AM EDT ----- Regarding: Please call patient   All guaiac cards positive.  Does she have a gastroenterologist?  M  ----- Message ----- From: Interface, Lab In [place] Sent: 04/09/2017  12:44 PM To: Rosey Bath, MD

## 2017-04-13 ENCOUNTER — Inpatient Hospital Stay (HOSPITAL_BASED_OUTPATIENT_CLINIC_OR_DEPARTMENT_OTHER): Payer: Medicare Other | Admitting: Hematology and Oncology

## 2017-04-13 ENCOUNTER — Other Ambulatory Visit: Payer: Self-pay | Admitting: *Deleted

## 2017-04-13 VITALS — BP 132/68 | HR 88 | Temp 97.0°F | Ht 65.0 in | Wt 124.8 lb

## 2017-04-13 DIAGNOSIS — D649 Anemia, unspecified: Secondary | ICD-10-CM | POA: Diagnosis not present

## 2017-04-13 DIAGNOSIS — Z79899 Other long term (current) drug therapy: Secondary | ICD-10-CM | POA: Diagnosis not present

## 2017-04-13 DIAGNOSIS — D801 Nonfamilial hypogammaglobulinemia: Secondary | ICD-10-CM

## 2017-04-13 DIAGNOSIS — F17210 Nicotine dependence, cigarettes, uncomplicated: Secondary | ICD-10-CM

## 2017-04-13 DIAGNOSIS — D508 Other iron deficiency anemias: Secondary | ICD-10-CM

## 2017-04-13 NOTE — Progress Notes (Signed)
[facility]-  Cancer Center  Clinic day:  04/13/2017  Chief Complaint: Linda Owens is a 83 y.o. female with a normocytic anemia who is seen for review of work-up and discussion regarding direction of therapy.  HPI:  The patient was last seen in the hematology clinic on 04/02/2017.  At that time, she was seen for initial consultation.  She had a mild normocytic anemia.  Hematocrit was 31.7, hemoglobin 10.7, and MCV 89.8 in 01/2017.  Creatinine and liver function tests were normal. Urinalysis in 01/2017 revealed moderate blood in conjunction with a UTI.   Work-up on 04/02/2017 revealed a hematocrit of 32.0, hemoglobin 10.6, MCV 89.4, platelets 193,000, WBC 9900 with an ANC of 7100.  Retic was 1.4%.  Iron studies revealed a saturation of 12% and a TIBC of 367.  Ferritin was 61.  Sed rate was 52.  SPEP revealed no monoclonal protein.  IgG was 569 [phone]  IgA and IGM were normal.  Kappa free light chains were 18.5, lambda free light chains 15 and ratio 1.23 (normal).  B12 was 1346 (sl elevated).  Folate was 57.7.  Symptomatically, she feels fine.  She is taking her oral iron with orange juice.   Past Medical History:  Diagnosis Date  . Allergy   . Cataract     Past Surgical History:  Procedure Laterality Date  . ABDOMINAL HYSTERECTOMY    . COLONOSCOPY  2003    Family History  Problem Relation Age of Onset  . Cancer Sister     Social History:  reports that she has been smoking.  She has a 12.50 pack-year smoking history. She does not have any smokeless tobacco history on file. She reports that she does not drink alcohol. Her drug history is not on file.   She lives independently in [place].  They provide a hot lunch at noon. Her daughter's name is Linda Owens.  Contact number is [phone].  Allergies:  Allergies  Allergen Reactions  . Nitrofurantoin Rash    Current Medications: Current Outpatient Prescriptions  Medication Sig Dispense Refill  . ALPRAZolam  (XANAX) 0.25 MG tablet Take 0.25 mg by mouth at bedtime as needed for anxiety.    Marland Kitchen aspirin 325 MG tablet Take 325 mg by mouth daily.    . ferrous sulfate 325 (65 FE) MG EC tablet Take 65 mg by mouth daily.    . Misc Natural Products (OSTEO BI-FLEX JOINT SHIELD PO) Take by mouth.    . Multiple Vitamins-Minerals (MACULAR VITAMIN BENEFIT PO) Take by mouth.    . simvastatin (ZOCOR) 20 MG tablet Take 20 mg by mouth daily.    Marland Kitchen tolterodine (DETROL LA) 4 MG 24 hr capsule Take 4 mg by mouth daily.    Marland Kitchen venlafaxine (EFFEXOR) 37.5 MG tablet Take 37.5 mg by mouth 2 (two) times daily.     No current facility-administered medications for this visit.     Review of Systems:  GENERAL:  Feels "ok".  No fevers or sweats.  Weight down 1 pound. PERFORMANCE STATUS (ECOG):  1 HEENT:  Decreased hearing.  Can only hear out of left ear.  No visual changes, runny nose, sore throat, mouth sores or tenderness. Lungs: No shortness of breath or cough.  No hemoptysis. Cardiac:  No chest pain, palpitations, orthopnea, or PND. GI:  No nausea, vomiting, diarrhea, constipation, melena or hematochezia. GU:  No urgency, frequency, dysuria, or hematuria. Musculoskeletal:  No back pain.  No joint pain.  No muscle tenderness. Extremities:  No pain or swelling. Skin:  No rashes or skin changes. Neuro:  No headache, numbness or weakness, balance or coordination issues. Endocrine:  No diabetes, thyroid issues, hot flashes or night sweats. Psych:  No mood changes, depression or anxiety. Pain:  No focal pain. Review of systems:  All other systems reviewed and found to be negative.  Physical Exam: Blood pressure 132/68, pulse 88, temperature 97 F (36.1 C), temperature source Tympanic, height  (1.651 m), weight 124 lb 12.8 oz (56.6 kg). GENERAL:  Thin elderly woman sitting comfortably in the exam room in no acute distress.  She has a cane at her side. MENTAL STATUS:  Alert and oriented to person, place and time. HEAD:   Curly gray hair.  Normocephalic, atraumatic, face symmetric, no Cushingoid features. EYES:  Glasses.  Blue eyes.  No conjunctivitis or scleral icterus. NEUROLOGICAL: Unremarkable. PSYCH:  Appropriate.   No visits with results within 3 Day(s) from this visit.  Latest known visit with results is:  Orders Only on 04/09/2017  Component Date Value Ref Range Status  . Fecal Occult Bld 04/04/2017 POSITIVE* NEGATIVE Final  . Fecal Occult Bld 04/05/2017 POSITIVE* NEGATIVE Final  . Fecal Occult Bld 04/06/2017 POSITIVE* NEGATIVE Final    Assessment:  Linda Owens is a 83 y.o. female with a mild progressive normocytic anemia since 04/2016.  Diet is good.  Guaiac cards were negative in 02/2017.  She had a colonoscopy in 2000 which was "good".  She is on no new medications or herbal products.  She has been on oral iron since 02/23/2016.  CBC on 01/31/2017 included a hematocrit 31.7, hemoglobin 10.7, MCV 89.8, platelets 211,000, white count 13,600, and ANC of 10,370. Differential was unremarkable.  Comprehensive metabolic panel included a creatinine of 1.0, calcium 8.6, albumin 3.5 and normal liver function tests. Urinalysis on 02/20/2017 revealed moderate blood.   Work-up on 04/02/2017 revealed a hematocrit of 32.0, hemoglobin 10.6, MCV 89.4, platelets 193,000, WBC 9900 with an ANC of 7100.  Normal studies included:  Ferritin (61), iron studies, SPEP, free light chain assay, B12, and folate.  Reticulocyte count was 1.4% (low).  Sedimentation rate was 52 (high).  IgG was 569 ([phone]  IgA and IGM were normal.    Symptomatically, she denies any melena, hematochezia, hematuria or vaginal bleeding.  She denies any B symptoms.  Exam is unremarkable.  Plan: 1.  Discuss work-up.  She has a mild stable normocytic anemia.  Ferritin may be falsely elevated secondary to an elevated sedimentation rate.  She has a low IgG level Etiology is unclear.  Will check 24 our urine for protein, protein electrophoresis, and  free light chains. 2.  Continue oral iron with orange juice. 3.  Guaiac cards x 3 4.  Collect 24 hour urine for UPEP and free light chains. 5.  RTC in 1 month for MD assessment and labs (CBC with diff, ferritin, iron studies, sed rate).  Addendum:  Guaiac cards positive on oral iron.  Will repeat off iron.   Rosey Bath, MD  04/13/2017, 3:26 PM

## 2017-04-13 NOTE — Progress Notes (Signed)
Patient here for follow up no changes since last appointment 

## 2017-04-15 DIAGNOSIS — D649 Anemia, unspecified: Secondary | ICD-10-CM | POA: Diagnosis not present

## 2017-04-17 ENCOUNTER — Other Ambulatory Visit: Payer: Self-pay | Admitting: *Deleted

## 2017-04-17 DIAGNOSIS — D649 Anemia, unspecified: Secondary | ICD-10-CM

## 2017-04-17 DIAGNOSIS — D801 Nonfamilial hypogammaglobulinemia: Secondary | ICD-10-CM

## 2017-04-19 LAB — IFE+PROTEIN ELECTRO, 24-HR UR
% BETA, Urine: 28 %
ALPHA 1 URINE: 3.8 %
Albumin, U: 36.8 %
Alpha 2, Urine: 7.9 %
GAMMA GLOBULIN URINE: 23.5 %
Total Protein, Urine-Ur/day: 128 mg/24 hr (ref 30–150)
Total Protein, Urine: 15 mg/dL
Total Volume: 850

## 2017-04-21 DIAGNOSIS — D649 Anemia, unspecified: Secondary | ICD-10-CM | POA: Diagnosis not present

## 2017-04-23 DIAGNOSIS — D649 Anemia, unspecified: Secondary | ICD-10-CM | POA: Diagnosis not present

## 2017-04-25 ENCOUNTER — Other Ambulatory Visit: Payer: Self-pay | Admitting: *Deleted

## 2017-04-25 DIAGNOSIS — D649 Anemia, unspecified: Secondary | ICD-10-CM

## 2017-04-25 LAB — OCCULT BLOOD X 1 CARD TO LAB, STOOL
Fecal Occult Bld: POSITIVE — AB
Fecal Occult Bld: POSITIVE — AB
Fecal Occult Bld: POSITIVE — AB

## 2017-04-27 ENCOUNTER — Telehealth: Payer: Self-pay | Admitting: *Deleted

## 2017-04-27 NOTE — Telephone Encounter (Signed)
Called patient to let her know all of her stool cards were positive.  Patient states she feels good.  She does not have have a GI physician.

## 2017-04-27 NOTE — Telephone Encounter (Signed)
-----   Message from Rosey BathMelissa C Corcoran, MD sent at 04/27/2017  2:03 PM EDT ----- Regarding: Please call patient  All guaiac cards are positive.  How does she feel?  Any visible blood in her stool (red or Amaliya Whitel[place])?  Does she have a GI physician?  M ----- Message ----- From: Interface, Lab In [place] Sent: 04/25/2017   3:56 PM To: Rosey BathMelissa C Corcoran, MD

## 2017-05-07 ENCOUNTER — Inpatient Hospital Stay: Payer: Medicare Other | Attending: Hematology and Oncology | Admitting: Hematology and Oncology

## 2017-05-07 ENCOUNTER — Encounter: Payer: Self-pay | Admitting: Hematology and Oncology

## 2017-05-07 ENCOUNTER — Other Ambulatory Visit: Payer: Self-pay | Admitting: *Deleted

## 2017-05-07 ENCOUNTER — Inpatient Hospital Stay: Payer: Medicare Other

## 2017-05-07 VITALS — BP 100/63 | HR 87 | Temp 98.6°F | Ht 65.0 in | Wt 126.4 lb

## 2017-05-07 DIAGNOSIS — D649 Anemia, unspecified: Secondary | ICD-10-CM | POA: Diagnosis not present

## 2017-05-07 DIAGNOSIS — Z79899 Other long term (current) drug therapy: Secondary | ICD-10-CM | POA: Diagnosis not present

## 2017-05-07 DIAGNOSIS — D508 Other iron deficiency anemias: Secondary | ICD-10-CM

## 2017-05-07 DIAGNOSIS — F17210 Nicotine dependence, cigarettes, uncomplicated: Secondary | ICD-10-CM | POA: Insufficient documentation

## 2017-05-07 DIAGNOSIS — D801 Nonfamilial hypogammaglobulinemia: Secondary | ICD-10-CM

## 2017-05-07 LAB — CBC WITH DIFFERENTIAL/PLATELET
Basophils Absolute: 0.1 10*3/uL (ref 0–0.1)
Basophils Relative: 1 %
Eosinophils Absolute: 0.2 10*3/uL (ref 0–0.7)
Eosinophils Relative: 2 %
HCT: 30.6 % — ABNORMAL LOW (ref 35.0–47.0)
Hemoglobin: 10.4 g/dL — ABNORMAL LOW (ref 12.0–16.0)
Lymphocytes Relative: 18 %
Lymphs Abs: 1.3 10*3/uL (ref 1.0–3.6)
MCH: 30.4 pg (ref 26.0–34.0)
MCHC: 34.1 g/dL (ref 32.0–36.0)
MCV: 89.2 fL (ref 80.0–100.0)
Monocytes Absolute: 0.7 10*3/uL (ref 0.2–0.9)
Monocytes Relative: 10 %
Neutro Abs: 4.9 10*3/uL (ref 1.4–6.5)
Neutrophils Relative %: 69 %
Platelets: 140 10*3/uL — ABNORMAL LOW (ref 150–440)
RBC: 3.44 MIL/uL — ABNORMAL LOW (ref 3.80–5.20)
RDW: 15.2 % — ABNORMAL HIGH (ref 11.5–14.5)
WBC: 7.2 10*3/uL (ref 3.6–11.0)

## 2017-05-07 LAB — URINALYSIS, COMPLETE (UACMP) WITH MICROSCOPIC
Bilirubin Urine: NEGATIVE
Glucose, UA: NEGATIVE mg/dL
Hgb urine dipstick: NEGATIVE
Ketones, ur: NEGATIVE mg/dL
Nitrite: POSITIVE — AB
Protein, ur: NEGATIVE mg/dL
Specific Gravity, Urine: 1.008 (ref 1.005–1.030)
pH: 7 (ref 5.0–8.0)

## 2017-05-07 LAB — SEDIMENTATION RATE: Sed Rate: 35 mm/hr — ABNORMAL HIGH (ref 0–30)

## 2017-05-07 LAB — IRON AND TIBC
Iron: 26 ug/dL — ABNORMAL LOW (ref 28–170)
Saturation Ratios: 7 % — ABNORMAL LOW (ref 10.4–31.8)
TIBC: 376 ug/dL (ref 250–450)
UIBC: 350 ug/dL

## 2017-05-07 LAB — FERRITIN: Ferritin: 23 ng/mL (ref 11–307)

## 2017-05-07 NOTE — Progress Notes (Signed)
Patient here for follow up. No changes since last appt. 

## 2017-05-07 NOTE — Progress Notes (Signed)
[facility]-  Cancer Center  Clinic day:  05/07/2017  Chief Complaint: Linda BeathOla D Mccalip is a 83 y.o. female with a normocytic anemia who is seen for review of work-up and discussion regarding direction of therapy.  HPI:  The patient was last seen in the hematology clinic on 04/13/2017.  At that time, work-up revealed a mild stable normocytic anemia.  Ferritin was felt falsely elevated secondary to the elevated sed rate.  The etiology of her low IgG was unclear.  She was to continue her oral iron.  24 hour was ordered.  24 hour UPEP on 04/15/2017 revealed no monoclonal protein.  Guaiac cards were positive on oral iron with plan to repeat off oral iron.  Repeat guaiac cards were positive (04/04/2017 - 04/06/2017; 04/21/2017 - 04/24/2017).  Symptomatically, she feels fine.  Sometimes she is dizzy in the AM.  She notes that her stool is mixed with urine.  Last colonoscopy was 10 years ago.   Past Medical History:  Diagnosis Date  . Allergy   . Cataract     Past Surgical History:  Procedure Laterality Date  . ABDOMINAL HYSTERECTOMY    . COLONOSCOPY  2003    Family History  Problem Relation Age of Onset  . Cancer Sister     Social History:  reports that she has been smoking.  She has a 12.50 pack-year smoking history. She has never used smokeless tobacco. She reports that she does not drink alcohol. Her drug history is not on file.   She [place] independently in [place].  They provide a hot lunch at noon. Her daughter's name is [name].  Contact number is [phone].  She is accompanied by her youngest son, Jillyn HiddenGary, today.  Allergies:  Allergies  Allergen Reactions  . Nitrofurantoin Rash    Current Medications: Current Outpatient Prescriptions  Medication Sig Dispense Refill  . ALPRAZolam (XANAX) 0.25 MG tablet Take 0.25 mg by mouth at bedtime as needed for anxiety.    Marland Kitchen. aspirin 325 MG tablet Take 325 mg by mouth daily.    . ferrous sulfate 325 (65 FE) MG EC tablet  Take 65 mg by mouth daily.    . Misc Natural Products (OSTEO BI-FLEX JOINT SHIELD PO) Take by mouth.    . Multiple Vitamins-Minerals (MACULAR VITAMIN BENEFIT PO) Take by mouth.    . simvastatin (ZOCOR) 20 MG tablet Take 20 mg by mouth daily.    Marland Kitchen. tolterodine (DETROL LA) 4 MG 24 hr capsule Take 4 mg by mouth daily.    Marland Kitchen. venlafaxine (EFFEXOR) 37.5 MG tablet Take 37.5 mg by mouth 2 (two) times daily.     No current facility-administered medications for this visit.     Review of Systems:  GENERAL:  Feels "fine".  No fevers or sweats.  Weight up 2 pounds. PERFORMANCE STATUS (ECOG):  1 HEENT:  Decreased hearing.  Can only hear out of left ear.  No visual changes, runny nose, sore throat, mouth sores or tenderness. Lungs: No shortness of breath or cough.  No hemoptysis. Cardiac:  No chest pain, palpitations, orthopnea, or PND. GI:  No nausea, vomiting, diarrhea, constipation, melena or hematochezia.  Colonoscopy 10 years ago. GU:  No urgency, frequency, dysuria, or hematuria. Musculoskeletal:  No back pain.  No joint pain.  No muscle tenderness. Extremities:  No pain or swelling. Skin:  No rashes or skin changes. Neuro:  Dizziness in AM.  No headache, numbness or weakness, balance or coordination issues. Endocrine:  No diabetes, thyroid  issues, hot flashes or night sweats. Psych:  No mood changes, depression or anxiety. Pain:  No focal pain. Review of systems:  All other systems reviewed and found to be negative.  Physical Exam: 124 Blood pressure 100/63, pulse 87, temperature 98.6 F (37 C), temperature source Tympanic, height 5\' 5"  (1.651 m), weight 126 lb 6.4 oz (57.3 kg). GENERAL:  Thin elderly woman sitting comfortably in the exam room in no acute distress.  She has a cane at her side. MENTAL STATUS:  Alert and oriented to person, place and time. HEAD:  Curly gray hair.  Normocephalic, atraumatic, face symmetric, no Cushingoid features. EYES:  Glasses.  Blue eyes.  No conjunctivitis or  scleral icterus. NEUROLOGICAL: Unremarkable. PSYCH:  Appropriate.   No visits with results within 3 Day(s) from this visit.  Latest known visit with results is:  Orders Only on 04/25/2017  Component Date Value Ref Range Status  . Fecal Occult Bld 04/21/2017 POSITIVE* NEGATIVE Final  . Fecal Occult Bld 04/23/2017 POSITIVE* NEGATIVE Final  . Fecal Occult Bld 04/24/2017 POSITIVE* NEGATIVE Final    Assessment:  Linda Owens is a 83 y.o. female with a mild progressive normocytic anemia since 04/2016.  Diet is good.  Guaiac cards were negative in 02/2017.  She had a colonoscopy in 2000 which was "good".  She is on no new medications or herbal products.  She has been on oral iron since 02/22/2017.  CBC on 01/31/2017 included a hematocrit 31.7, hemoglobin 10.7, MCV 89.8, platelets 211,000, white count 13,600, and ANC of 10,370. Differential was unremarkable.  Comprehensive metabolic panel included a creatinine of 1.0, calcium 8.6, albumin 3.5 and normal liver function tests. Urinalysis on 02/20/2017 revealed moderate blood.   Work-up on 04/02/2017 revealed a hematocrit of 32.0, hemoglobin 10.6, MCV 89.4, platelets 193,000, WBC 9900 with an ANC of 7100.  Normal studies included:  Ferritin (61), iron studies, SPEP, free light chain assay, B12, and folate.  Reticulocyte count was 1.4% (low).  Sedimentation rate was 52 (high).  IgG was 569 [phone]  IgA and IGM were normal.  24 hour UPEP on 04/15/2017 revealed no monoclonal protein. Guaiac cards were positive x 6.  Symptomatically, she denies any melena, hematochezia, hematuria or vaginal bleeding.  Exam is unremarkable.  Plan: 1.  Labs today:  CBC with diff, ferritin, iron studies, sed rate, IgG. 2.  Urinalysis. 3.  GI consult. 4.  RTC after GI consult.   Rosey Bath, MD  05/07/2017, 2:20 PM

## 2017-05-08 ENCOUNTER — Encounter: Payer: Self-pay | Admitting: Gastroenterology

## 2017-05-08 ENCOUNTER — Telehealth: Payer: Self-pay | Admitting: *Deleted

## 2017-05-08 LAB — IGG: IgG (Immunoglobin G), Serum: 608 mg/dL — ABNORMAL LOW (ref 700–1600)

## 2017-05-08 NOTE — Telephone Encounter (Signed)
States Linda Owens called her earlier and would like her to call again please. [phone]

## 2017-05-08 NOTE — Telephone Encounter (Signed)
Patient's daughter called back to say she will take her mom to PCP for urine culture.  States he has treated her this problem before.

## 2017-05-08 NOTE — Telephone Encounter (Signed)
-----   Message from Rosey BathMelissa C Corcoran, MD sent at 05/07/2017  4:59 PM EDT ----- Regarding: Please call patient's daughter, [name]  Urine with no blood.  However urine appears to show an infection.  Urine culture can be done here or with PCP.  M  ----- Message ----- From: Interface, Lab In [place] Sent: 05/07/2017   3:06 PM To: Rosey BathMelissa C Corcoran, MD

## 2017-05-08 NOTE — Telephone Encounter (Signed)
Called patient's daughter and LVM that there was no blood in patient's urine, however, it does show some infection.  Patient will need urine culture.  We can do it or she can see her PCP.  Advised her to call if they want to come here.

## 2017-05-15 ENCOUNTER — Other Ambulatory Visit: Payer: Medicare Other

## 2017-05-15 ENCOUNTER — Ambulatory Visit: Payer: Medicare Other | Admitting: Hematology and Oncology

## 2017-07-04 ENCOUNTER — Ambulatory Visit: Payer: Medicare Other | Admitting: Gastroenterology

## 2017-07-04 DIAGNOSIS — E785 Hyperlipidemia, unspecified: Secondary | ICD-10-CM | POA: Insufficient documentation

## 2017-08-03 ENCOUNTER — Ambulatory Visit: Payer: Medicare Other | Admitting: Gastroenterology

## 2017-08-22 ENCOUNTER — Ambulatory Visit (INDEPENDENT_AMBULATORY_CARE_PROVIDER_SITE_OTHER): Payer: Medicare Other | Admitting: Gastroenterology

## 2017-08-22 ENCOUNTER — Encounter: Payer: Self-pay | Admitting: Gastroenterology

## 2017-08-22 ENCOUNTER — Other Ambulatory Visit
Admission: RE | Admit: 2017-08-22 | Discharge: 2017-08-22 | Disposition: A | Discharge status: Home / Self Care | Payer: Medicare Other | Source: Ambulatory Visit | Attending: Gastroenterology | Admitting: Gastroenterology

## 2017-08-22 ENCOUNTER — Other Ambulatory Visit: Payer: Self-pay

## 2017-08-22 VITALS — BP 103/65 | HR 73 | Temp 97.7°F | Ht 65.0 in | Wt 124.0 lb

## 2017-08-22 DIAGNOSIS — D509 Iron deficiency anemia, unspecified: Secondary | ICD-10-CM | POA: Insufficient documentation

## 2017-08-22 LAB — IRON AND TIBC
Iron: 61 ug/dL (ref 28–170)
SATURATION RATIOS: 16 % (ref 10.4–31.8)
TIBC: 378 ug/dL (ref 250–450)
UIBC: 317 ug/dL

## 2017-08-22 LAB — CBC
HCT: 34.2 % — ABNORMAL LOW (ref 35.0–47.0)
Hemoglobin: 11.5 g/dL — ABNORMAL LOW (ref 12.0–16.0)
MCH: 30.5 pg (ref 26.0–34.0)
MCHC: 33.6 g/dL (ref 32.0–36.0)
MCV: 90.7 fL (ref 80.0–100.0)
PLATELETS: 145 10*3/uL — AB (ref 150–440)
RBC: 3.77 MIL/uL — AB (ref 3.80–5.20)
RDW: 15.1 % — ABNORMAL HIGH (ref 11.5–14.5)
WBC: 6.9 10*3/uL (ref 3.6–11.0)

## 2017-08-22 LAB — FERRITIN: FERRITIN: 26 ng/mL (ref 11–307)

## 2017-08-22 NOTE — Patient Instructions (Signed)
1. Labs today 2. Start miralax 17gm two times daily 3. Drink 8 glasses of water daily and prune juice   Please call our office to speak with my nurse Iva Lento at [phone] during business hours from 8am to 4pm if you have any questions/concerns. During after hours, you will be redirected to on call GI physician. For any emergency please call 911 or go the nearest emergency room.    Arlyss Repress, MD [address]  [address]  [place], [place] [address]  Main: [phone]  Fax: [phone]

## 2017-08-22 NOTE — Progress Notes (Signed)
Linda Repressohini R Vanga, MD [address]  [address]  [place], [place] [address]  Main: [phone]  Fax: [phone]    Gastroenterology Consultation  Referring Provider:     Jerl MinaHedrick, James, MD Primary Care Physician:  Linda MinaHedrick, James, MD Primary Gastroenterologist:  Dr. Arlyss Repressohini R Owens Reason for Consultation:     Iron deficiency anemia, fecal occult blood positive        HPI:   Linda Owens is a 83 y.o. y/o female referred by Dr. Jerl MinaHedrick, James, MD  for consultation & management of Iron deficiency anemia and fecal occult blood positive. She has history of TIA has been on aspirin 325 mg daily for last 2 years. She has history of chronic constipation, associated with straining and manual disimpaction. She drinks only 3 glasses of water daily. She has been having chronic iron deficiency anemia with hemoglobin of nadir of 8.1 in 02/2016 and low iron levels. She has been on iron supplements, her hemoglobin improved to 10-11 a month after, has been maintaining at that level. Her hemoglobin was 10.4 in May/2018. Ferritin was 23. She is taking one iron pill daily. For workup of iron deficiency anemia, she was referred to Dr. Merlene Owens for iron deficiency anemia and fecal occult blood test has returned positive as part of workup. As, was referred to GI for colonoscopy.  With regards to her GI symptoms, she denies abdominal pain, bloating, pencil thin stools, diarrhea, rectal bleeding. She lost about 2 pounds since May/2018. She denies any loss of appetite but eats half a banana for breakfast, full meals for lunch and leftover food from lunch for dinner. She denies being tired. She denies any upper GI symptoms.  She is accompanied by her daughter today who lives in [place]. Patient leaves alone in senior citizen complex and is independent of her ADLs.  GI Procedures: She reports having a colonoscopy about 12-15 years ago and reportedly normal.  Past Medical History:  Diagnosis Date  . Allergy   .  Cataract     Past Surgical History:  Procedure Laterality Date  . ABDOMINAL HYSTERECTOMY    . COLONOSCOPY  2003    Prior to Admission medications   Medication Sig Start Date End Date Taking? Authorizing Provider  ALPRAZolam Prudy Feeler(XANAX) 0.25 MG tablet Take 0.25 mg by mouth at bedtime as needed for anxiety.   Yes [provider]  aspirin 325 MG tablet Take 325 mg by mouth daily.   Yes [provider]  Docusate Calcium (STOOL SOFTENER PO) Take by mouth.   Yes [provider]  ferrous sulfate 325 (65 FE) MG EC tablet Take 65 mg by mouth daily.   Yes [provider]  lidocaine (LIDODERM) 5 % Apply patch to the most painful area for up to 12 hours in a 24 hour period. 07/10/17  Yes [provider]  MELATONIN PO Take by mouth.   Yes [provider]  Misc Natural Products (OSTEO BI-FLEX JOINT SHIELD PO) Take by mouth.   Yes [provider]  Multiple Vitamins-Minerals (MACULAR VITAMIN BENEFIT PO) Take by mouth.   Yes [provider]  simvastatin (ZOCOR) 20 MG tablet TAKE 1 TABLET DAILY 08/03/17  Yes [provider]  tolterodine (DETROL LA) 4 MG 24 hr capsule Take 4 mg by mouth daily.   Yes [provider]  venlafaxine (EFFEXOR) 37.5 MG tablet Take 37.5 mg by mouth 2 (two) times daily.   Yes [provider]    Family History  Problem  Relation Age of Onset  . Cancer Sister      Social History  Substance Use Topics  . Smoking status: Light Tobacco Smoker    Packs/day: 0.25    Years: 50.00  . Smokeless tobacco: Never Used  . Alcohol use No    Allergies as of 08/22/2017 - Review Complete 08/22/2017  Allergen Reaction Noted  . Nitrofurantoin Rash 03/22/2017    Review of Systems:    All systems reviewed and negative except where noted in HPI.   Physical Exam:  BP 103/65   Pulse 73   Temp 97.7 F (36.5 C) (Oral)   Ht 5\' 5"  (1.651 m)   Wt 124 lb (56.2 kg)   BMI 20.63 kg/m  No LMP  recorded.  General:   Alert,  Well-developed, well-nourished, pleasant and cooperative in NAD, thin built Head:  Normocephalic and atraumatic. Eyes:  Sclera clear, no icterus.   Conjunctiva pink. Ears:  Normal auditory acuity. Nose:  No deformity, discharge, or lesions. Mouth:  No deformity or lesions,oropharynx pink & moist. Neck:  Supple; no masses or thyromegaly. Lungs:  Respirations even and unlabored.  Clear throughout to auscultation.   No wheezes, crackles, or rhonchi. No acute distress. Heart:  Regular rate and rhythm; no murmurs, clicks, rubs, or gallops. Abdomen:  Normal bowel sounds.  No bruits.  Soft, non-tender and non-distended without masses, hepatosplenomegaly or hernias noted.  No guarding or rebound tenderness.   Rectal: Nor performed Msk:  Symmetrical without gross deformities. Good, equal movement & strength bilaterally. Pulses:  Normal pulses noted. Extremities:  No clubbing or edema.  No cyanosis. Neurologic:  Alert and oriented x3;  grossly normal neurologically. Skin:  Intact without significant lesions or rashes. No jaundice, ecchymotic spots on upper extremities. Psych:  Alert and cooperative. Normal mood and affect.  Imaging Studies: No abdominal imaging  Assessment and Plan:   Linda Owens is a 83 y.o. y/o female with TIA on aspirin 325 mg daily, mild stable iron deficiency anemia, FOBT positive. Also, has chronic constipation. The stool guaiac positive could be related to mucosal trauma in the setting of chronic constipation but I cannot rule out underlying colon cancer. Today, I discussed about the risks and benefits of colonoscopy. Given her age, she and her daughter opted to have conservative approach, manage constipation, recheck CBC and iron stores today. If her hemoglobin is declining, it is reasonable to perform colonoscopy to look for underlying malignancy.   1. Labs today 2. Start miralax 17gm two times daily to treat constipation 3. Drink 8 glasses  of water daily and prune juice 4. Recommend discussing about discontinuation of long-term full dose aspirin use for her past history of TIA. She has no neurologic deficits.  Follow up in 2 months   Linda Repress, MD

## 2017-10-19 ENCOUNTER — Telehealth: Payer: Self-pay | Admitting: Gastroenterology

## 2017-10-19 NOTE — Telephone Encounter (Signed)
Patients daughter left a voice message  to cancel appt. She stated she was told that everything was fine and Yazlynn didn't need to come back in.

## 2017-10-22 ENCOUNTER — Ambulatory Visit: Payer: Medicare Other | Admitting: Gastroenterology

## 2018-04-11 ENCOUNTER — Ambulatory Visit: Payer: Self-pay | Admitting: Urology

## 2018-04-22 ENCOUNTER — Ambulatory Visit: Payer: Self-pay | Admitting: Urology

## 2018-05-27 ENCOUNTER — Encounter: Payer: Self-pay | Admitting: Urology

## 2018-05-27 ENCOUNTER — Ambulatory Visit (INDEPENDENT_AMBULATORY_CARE_PROVIDER_SITE_OTHER): Payer: Medicare Other | Admitting: Urology

## 2018-05-27 VITALS — BP 111/62 | HR 97 | Ht 65.0 in | Wt 128.8 lb

## 2018-05-27 DIAGNOSIS — N390 Urinary tract infection, site not specified: Secondary | ICD-10-CM | POA: Diagnosis not present

## 2018-05-27 LAB — URINALYSIS, COMPLETE
Bilirubin, UA: NEGATIVE
Glucose, UA: NEGATIVE
KETONES UA: NEGATIVE
Leukocytes, UA: NEGATIVE
NITRITE UA: NEGATIVE
PROTEIN UA: NEGATIVE
RBC UA: NEGATIVE
Urobilinogen, Ur: 0.2 mg/dL (ref 0.2–1.0)
pH, UA: 6.5 (ref 5.0–7.5)

## 2018-05-27 NOTE — Progress Notes (Signed)
05/27/2018 1:06 PM   Pranika Annye Asa Son 06/06/1924 [id number]  Referring provider: Jerl MinaHedrick, James, MD [address] [facility] [place], [place] [id number]  Chief Complaint  Patient presents with  . Recurrent UTI    HPI: Consulted to assess the patient's recurrent bladder infections.  Some of the details were difficult to ascertain but it appears that she overall feels poorly when she gets a bladder infection and she may have had 4 or 5 in the last year.  She has not been hospitalized with fever.  Her son was helpful.  At baseline she gets up 3-4 times a night.  She wears 1 pad during the day and 2 during the night.  I reviewed the medical record and I did not see urine cultures or a recent renal x-ray  She denies a history of kidney stones and previous GU surgery.  She has had a hysterectomy.  She has not had a stroke  Modifying factors: There are no other modifying factors  Associated signs and symptoms: There are no other associated signs and symptoms Aggravating and relieving factors: There are no other aggravating or relieving factors Severity: Moderate Duration: Persistent   PMH: Past Medical History:  Diagnosis Date  . Allergy   . Cataract     Surgical History: Past Surgical History:  Procedure Laterality Date  . ABDOMINAL HYSTERECTOMY    . COLONOSCOPY  2003    Home Medications:  Allergies as of 05/27/2018      Reactions   Nitrofurantoin Rash      Medication List        Accurate as of 05/27/18  1:06 PM. Always use your most recent med list.          ALPRAZolam 0.25 MG tablet Commonly known as:  XANAX Take 0.25 mg by mouth at bedtime as needed for anxiety.   aspirin 325 MG tablet Take 325 mg by mouth daily.   ferrous sulfate 325 (65 FE) MG EC tablet Take 65 mg by mouth daily.   lidocaine 5 % Commonly known as:  LIDODERM Apply patch to the most painful area for up to 12 hours in a 24 hour period.   MACULAR VITAMIN BENEFIT PO Take by mouth.     MELATONIN PO Take by mouth.   OSTEO BI-FLEX JOINT SHIELD PO Take by mouth.   simvastatin 20 MG tablet Commonly known as:  ZOCOR TAKE 1 TABLET DAILY   STOOL SOFTENER PO Take by mouth.   tolterodine 4 MG 24 hr capsule Commonly known as:  DETROL LA Take 4 mg by mouth daily.   venlafaxine 37.5 MG tablet Commonly known as:  EFFEXOR Take 37.5 mg by mouth 2 (two) times daily.       Allergies:  Allergies  Allergen Reactions  . Nitrofurantoin Rash    Family History: Family History  Problem Relation Age of Onset  . Cancer Sister     Social History:  reports that she has been smoking.  She has a 12.50 pack-year smoking history. She has never used smokeless tobacco. She reports that she does not drink alcohol. Her drug history is not on file.  ROS:                                        Physical Exam: There were no vitals taken for this visit.  Constitutional:  Alert and oriented, No acute distress.  HEENT: [place] AT, moist mucus membranes.  Trachea midline, no masses. Cardiovascular: No clubbing, cyanosis, or edema. Respiratory: Normal respiratory effort, no increased work of breathing. GI: Abdomen is soft, nontender, nondistended, no abdominal masses GU: No CVA tenderness.  Skin: No rashes, bruises or suspicious lesions. Lymph: No cervical or inguinal adenopathy. Neurologic: Grossly intact, no focal deficits, moving all 4 extremities. Psychiatric: Normal mood and affect.  Laboratory Data: Lab Results  Component Value Date   WBC 6.9 08/22/2017   HGB 11.5 (L) 08/22/2017   HCT 34.2 (L) 08/22/2017   MCV 90.7 08/22/2017   PLT 145 (L) 08/22/2017    No results found for: CREATININE  No results found for: PSA  No results found for: TESTOSTERONE  No results found for: HGBA1C  Urinalysis    Component Value Date/Time   COLORURINE YELLOW (A) 05/07/2017 1455   APPEARANCEUR CLOUDY (A) 05/07/2017 1455   LABSPEC 1.008 05/07/2017 1455   PHURINE  7.0 05/07/2017 1455   GLUCOSEU NEGATIVE 05/07/2017 1455   HGBUR NEGATIVE 05/07/2017 1455   BILIRUBINUR NEGATIVE 05/07/2017 1455   KETONESUR NEGATIVE 05/07/2017 1455   PROTEINUR NEGATIVE 05/07/2017 1455   NITRITE POSITIVE (A) 05/07/2017 1455   LEUKOCYTESUR LARGE (A) 05/07/2017 1455    Pertinent Imaging: none  Assessment & Plan: We spoke about recurrent bladder infections and the role of a renal ultrasound.  Her urinalysis was normal today but I sent her urine for culture.  I thought it was reasonable to start her on prophylaxis and when I see her back in about 6 weeks to get a renal ultrasound.  I did not perform cystoscopy.  At this stage I will not evaluate or treat her incontinence though it may improve in the future if we can control her urinary tract infection  1. Recurrent UTI  - Urinalysis, Complete   No follow-ups on file.  Martina Sinner, MD  [facility] [address], [address] [place], [place] [phone]

## 2018-05-27 NOTE — Addendum Note (Signed)
Addended by: Honor LohGARRISON, CARRIE M on: 05/27/2018 03:16 PM   Modules accepted: Orders

## 2018-05-28 ENCOUNTER — Telehealth: Payer: Self-pay | Admitting: Urology

## 2018-05-28 ENCOUNTER — Other Ambulatory Visit: Payer: Self-pay | Admitting: Family Medicine

## 2018-05-28 MED ORDER — TRIMETHOPRIM 100 MG PO TABS: 100.0000 mg | ORAL_TABLET | Freq: Every day | ORAL | 11 refills | Status: DC

## 2018-05-28 NOTE — Telephone Encounter (Signed)
Trimethoprim was sent to pharmacy. Patient notified

## 2018-05-28 NOTE — Telephone Encounter (Signed)
Pt should have received low dose prophylaxis per her son and MacDiarmid's office note.  Please give him a call Darleene Cleaverom Trivedi [phone].

## 2018-06-03 NOTE — Telephone Encounter (Signed)
Left mess for patient's son Elijah Birkom , that Trimethroprim was sent to pharm on 05-28-18 and patient is to take one daily to prevent UTI

## 2018-06-11 ENCOUNTER — Other Ambulatory Visit: Payer: Self-pay | Admitting: Family Medicine

## 2018-06-11 ENCOUNTER — Telehealth: Payer: Self-pay | Admitting: Urology

## 2018-06-11 NOTE — Telephone Encounter (Signed)
Pt dghtr [name] asking to speak to someone for some medication clarification. Did not specify medication. Please call Bonnie @ [phone].  °

## 2018-06-11 NOTE — Telephone Encounter (Signed)
Pt dghtr [name] asking to speak to someone for some medication clarification. Did not specify medication. Please call [name] @ [phone].

## 2018-06-12 ENCOUNTER — Other Ambulatory Visit: Payer: Self-pay | Admitting: Radiology

## 2018-06-12 ENCOUNTER — Telehealth: Payer: Self-pay | Admitting: Radiology

## 2018-06-12 DIAGNOSIS — N390 Urinary tract infection, site not specified: Secondary | ICD-10-CM

## 2018-06-12 MED ORDER — CEFDINIR 300 MG PO CAPS: 300.0000 mg | ORAL_CAPSULE | Freq: Two times a day (BID) | ORAL | 0 refills | Status: DC

## 2018-06-12 MED ORDER — TRIMETHOPRIM 100 MG PO TABS: 100.0000 mg | ORAL_TABLET | Freq: Every day | ORAL | 3 refills | Status: DC

## 2018-06-12 NOTE — Telephone Encounter (Signed)
Urine Culture, Routine - [facility]: 06/01/2018 11:36 AM [facility] Component Name Value Ref Range  Urine Culture, Routine - [facility] Final report (A)   Result 1 - [facility] Escherichia coli (A)  Comment: 10,000-25,000 colony forming units per mL Cefazolin <=4 ug/mL Cefazolin with an MIC <=16 predicts susceptibility to the oral agents cefaclor, cefdinir, cefpodoxime, cefprozil, cefuroxime, cephalexin, and loracarbef when used for therapy of uncomplicated urinary tract infections due to E. coli, Klebsiella pneumoniae, and Proteus mirabilis.   Antimicrobial Susceptibility - [facility] Comment  Comment: ** S = Susceptible; I = Intermediate; R = Resistant **  P = Positive; N = Negative MICS are expressed in micrograms per mL  Antibiotic RSLT#1RSLT#2RSLT#3RSLT#4 Amoxicillin/Clavulanic AcidR Ampicillin R CefazolinR Cefepime S CeftriaxoneS Cefuroxime S CiprofloxacinR ErtapenemS Gentamicin S Imipenem S Levofloxacin R MeropenemS Nitrofurantoin S Piperacillin/TazobactamI Tetracycline R Tobramycin S Trimethoprim/Sulfa R   Specimen Collected on  Urine - Urine 05/30/2018 2:23 PM  Result Narrative  Performed at:[facility] [employer] [place] [address], [place], [id number] Lab Director: Jolene SchimkeSanjai Nagendra MD, [phone]

## 2018-06-12 NOTE — Telephone Encounter (Signed)
Pt dtr informed 

## 2018-06-12 NOTE — Telephone Encounter (Signed)
Daughter states positive urine culture results were sent by Dr Burnett ShengHedrick. See results below. Patient is currently on trimethoprim but urine culture shows resistance to this antibiotic. Please advise.

## 2018-06-12 NOTE — Telephone Encounter (Signed)
Order received from Dr Sherron MondayMacDiarmid for Truman Haywardmnicef 300mg  po bid for 1 week then restart prophylactic trimethoprim. Clarified order due to urine culture shows resistance to trimethoprim. Dr Sherron MondayMacDiarmid confirmed orders as above. Made daughter aware of script sent to pharmacy. Daughter voices understanding.

## 2018-07-01 ENCOUNTER — Ambulatory Visit
Admission: RE | Admit: 2018-07-01 | Discharge: 2018-07-01 | Disposition: A | Discharge status: Home / Self Care | Payer: Medicare Other | Source: Ambulatory Visit | Attending: Urology | Admitting: Urology

## 2018-07-01 DIAGNOSIS — N390 Urinary tract infection, site not specified: Secondary | ICD-10-CM | POA: Insufficient documentation

## 2018-07-01 DIAGNOSIS — N281 Cyst of kidney, acquired: Secondary | ICD-10-CM | POA: Insufficient documentation

## 2018-07-08 ENCOUNTER — Encounter: Payer: Self-pay | Admitting: Urology

## 2018-07-08 ENCOUNTER — Ambulatory Visit (INDEPENDENT_AMBULATORY_CARE_PROVIDER_SITE_OTHER): Payer: Medicare Other | Admitting: Urology

## 2018-07-08 VITALS — BP 94/55 | HR 88 | Ht 65.0 in | Wt 128.0 lb

## 2018-07-08 DIAGNOSIS — N3020 Other chronic cystitis without hematuria: Secondary | ICD-10-CM

## 2018-07-08 NOTE — Progress Notes (Signed)
07/08/2018 1:40 PM   BREINDEL COLLIER 10/22/1924 [id number]  Referring provider: Jerl Mina, MD [address] [facility] [place], [place] [address]  Chief Complaint  Patient presents with  . Results    6wk    HPI: Consulted to assess the patient's recurrent bladder infections.  Some of the details were difficult to ascertain but it appears that she overall feels poorly when she gets a bladder infection and she may have had 4 or 5 in the last year.  She has not been hospitalized with fever.  Her son was helpful.  At baseline she gets up 3-4 times a night.  She wears 1 pad during the day and 2 during the night.  I reviewed the medical record and I did not see urine cultures or a recent renal x-ray  We spoke about recurrent bladder infections and the role of a renal ultrasound.  Her urinalysis was normal today but I sent her urine for culture.  I thought it was reasonable to start her on prophylaxis and when I see her back in about 6 weeks to get a renal ultrasound.  I did not perform cystoscopy.  At this stage I will not evaluate or treat her incontinence though it may improve in the future if we can control her urinary tract infection  Today Renal ultrasound functionally within normal limits.  The patient had a positive urine culture.  I think there was a delay before the patient started the prophylaxis.  Frequency is stable  Clinically not infected  PMH: Past Medical History:  Diagnosis Date  . Allergy   . Cataract     Surgical History: Past Surgical History:  Procedure Laterality Date  . ABDOMINAL HYSTERECTOMY    . COLONOSCOPY  2003    Home Medications:  Allergies as of 07/08/2018      Reactions   Nitrofurantoin Rash      Medication List        Accurate as of 07/08/18  1:40 PM. Always use your most recent med list.          ALPRAZolam 0.25 MG tablet Commonly known as:  XANAX Take 0.25 mg by mouth at bedtime as needed for anxiety.   aspirin 325 MG  tablet Take 325 mg by mouth daily.   cefdinir 300 MG capsule Commonly known as:  OMNICEF Take 1 capsule (300 mg total) by mouth 2 (two) times daily.   ferrous sulfate 325 (65 FE) MG EC tablet Take 65 mg by mouth daily.   lidocaine 5 % Commonly known as:  LIDODERM Apply patch to the most painful area for up to 12 hours in a 24 hour period.   MACULAR VITAMIN BENEFIT PO Take by mouth.   MELATONIN PO Take by mouth.   OSTEO BI-FLEX JOINT SHIELD PO Take by mouth.   simvastatin 20 MG tablet Commonly known as:  ZOCOR TAKE 1 TABLET DAILY   STOOL SOFTENER PO Take by mouth.   tolterodine 4 MG 24 hr capsule Commonly known as:  DETROL LA Take 4 mg by mouth daily.   trimethoprim 100 MG tablet Commonly known as:  TRIMPEX Take 1 tablet (100 mg total) by mouth daily.   venlafaxine 37.5 MG tablet Commonly known as:  EFFEXOR Take 37.5 mg by mouth 2 (two) times daily.       Allergies:  Allergies  Allergen Reactions  . Nitrofurantoin Rash    Family History: Family History  Problem Relation Age of Onset  .  Cancer Sister   . Bladder Cancer Neg Hx   . Kidney cancer Neg Hx     Social History:  reports that she has been smoking.  She has a 12.50 pack-year smoking history. She has never used smokeless tobacco. She reports that she does not drink alcohol or use drugs.  ROS:                                        Physical Exam: There were no vitals taken for this visit.  Constitutional:  Alert and oriented, No acute distress.  Laboratory Data: Lab Results  Component Value Date   WBC 6.9 08/22/2017   HGB 11.5 (L) 08/22/2017   HCT 34.2 (L) 08/22/2017   MCV 90.7 08/22/2017   PLT 145 (L) 08/22/2017    No results found for: CREATININE  No results found for: PSA  No results found for: TESTOSTERONE  No results found for: HGBA1C  Urinalysis    Component Value Date/Time   COLORURINE YELLOW (A) 05/07/2017 1455   APPEARANCEUR Clear 05/27/2018  1310   LABSPEC 1.008 05/07/2017 1455   PHURINE 7.0 05/07/2017 1455   GLUCOSEU Negative 05/27/2018 1310   HGBUR NEGATIVE 05/07/2017 1455   BILIRUBINUR Negative 05/27/2018 1310   KETONESUR NEGATIVE 05/07/2017 1455   PROTEINUR Negative 05/27/2018 1310   PROTEINUR NEGATIVE 05/07/2017 1455   NITRITE Negative 05/27/2018 1310   NITRITE POSITIVE (A) 05/07/2017 1455   LEUKOCYTESUR Negative 05/27/2018 1310    Pertinent Imaging:   Assessment & Plan: Reassess in 4 months.  Advised son to get urine cultures if needed  There are no diagnoses linked to this encounter.  No follow-ups on file.  Martina SinnerMACDIARMID,Alyn Riedinger A, MD  [facility] [address], [address] [place], [place] [address] [phone]

## 2018-08-29 ENCOUNTER — Telehealth: Payer: Self-pay | Admitting: Urology

## 2018-08-29 NOTE — Telephone Encounter (Signed)
Pt's daughter Natasha Mead called during lunch and left message for someone to return her call.  She has some questions about mom's meds.  [phone].

## 2018-09-02 NOTE — Telephone Encounter (Signed)
Spoke with patient's daughter she wanted to clarify which medication was suppresion abx. Trimethoprim was confirmed

## 2018-09-12 ENCOUNTER — Emergency Department: Admit: 2018-09-12 | Payer: MEDICARE | Primary: Registered Nurse

## 2018-09-12 ENCOUNTER — Inpatient Hospital Stay
Admit: 2018-09-12 | Discharge: 2018-09-12 | Disposition: A | Discharge status: Home / Self Care | Payer: MEDICARE | Attending: Emergency Medicine

## 2018-09-12 DIAGNOSIS — E860 Dehydration: Secondary | ICD-10-CM

## 2018-09-12 LAB — URINALYSIS
Bilirubin Urine: NEGATIVE
Blood, Urine: NEGATIVE
Glucose, Ur: NEGATIVE mg/dL
Ketones, Urine: NEGATIVE mg/dL
Leukocyte Esterase, Urine: NEGATIVE
Nitrite, Urine: NEGATIVE
Specific Gravity, UA: 1.02 (ref 1.005–1.030)
Urobilinogen, Urine: 1 E.U./dL (ref <2.0)
pH, UA: 6 (ref 5.0–8.0)

## 2018-09-12 LAB — COMPREHENSIVE METABOLIC PANEL
ALT: 8 U/L — ABNORMAL LOW (ref 10–40)
AST: 15 U/L (ref 15–37)
Albumin/Globulin Ratio: 1.8 (ref 1.1–2.2)
Albumin: 4.2 g/dL (ref 3.4–5.0)
Alkaline Phosphatase: 57 U/L (ref 40–129)
Anion Gap: 10 (ref 3–16)
BUN: 19 mg/dL (ref 7–20)
CO2: 26 mmol/L (ref 21–32)
Calcium: 9.6 mg/dL (ref 8.3–10.6)
Chloride: 102 mmol/L (ref 99–110)
Creatinine: 0.9 mg/dL (ref 0.6–1.2)
GFR African American: >60 (ref >60)
GFR Non-African American: 58 — AB (ref >60)
Globulin: 2.3 g/dL
Glucose: 113 mg/dL — ABNORMAL HIGH (ref 70–99)
Potassium: 4.3 mmol/L (ref 3.5–5.1)
Sodium: 138 mmol/L (ref 136–145)
Total Bilirubin: 0.3 mg/dL (ref 0.0–1.0)
Total Protein: 6.5 g/dL (ref 6.4–8.2)

## 2018-09-12 LAB — EKG 12-LEAD
Atrial Rate: 71 {beats}/min
P Axis: 47 degrees
P-R Interval: 146 ms
Q-T Interval: 408 ms
QRS Duration: 110 ms
QTc Calculation (Bazett): 443 ms
R Axis: 85 degrees
T Axis: 40 degrees
Ventricular Rate: 71 {beats}/min

## 2018-09-12 LAB — CBC WITH AUTO DIFFERENTIAL
Basophils %: 0.9 %
Basophils Absolute: 0.1 10*3/uL (ref 0.0–0.2)
Eosinophils %: 2.2 %
Eosinophils Absolute: 0.1 10*3/uL (ref 0.0–0.6)
Hematocrit: 23.3 % — ABNORMAL LOW (ref 36.0–48.0)
Hemoglobin: 7.7 g/dL — ABNORMAL LOW (ref 12.0–16.0)
Lymphocytes %: 12.3 %
Lymphocytes Absolute: 0.8 10*3/uL — ABNORMAL LOW (ref 1.0–5.1)
MCH: 28.7 pg (ref 26.0–34.0)
MCHC: 32.9 g/dL (ref 31.0–36.0)
MCV: 87.1 fL (ref 80.0–100.0)
MPV: 10.4 fL (ref 5.0–10.5)
Monocytes %: 10.2 %
Monocytes Absolute: 0.6 10*3/uL (ref 0.0–1.3)
Neutrophils %: 74.4 %
Neutrophils Absolute: 4.6 10*3/uL (ref 1.7–7.7)
Platelets: 205 10*3/uL (ref 135–450)
RBC: 2.68 M/uL — ABNORMAL LOW (ref 4.00–5.20)
RDW: 16.6 % — ABNORMAL HIGH (ref 12.4–15.4)
WBC: 6.2 10*3/uL (ref 4.0–11.0)

## 2018-09-12 LAB — MICROSCOPIC URINALYSIS

## 2018-09-12 LAB — TROPONIN: Troponin: <0.01 ng/mL (ref <0.01)

## 2018-09-12 MED ORDER — MECLIZINE HCL 25 MG PO TABS
25 MG | ORAL_TABLET | Freq: Three times a day (TID) | ORAL | 0 refills | Status: AC | PRN
Start: 2018-09-12 — End: 2018-09-17

## 2018-09-12 MED ORDER — SODIUM CHLORIDE 0.9 % IV BOLUS
0.9 % | Freq: Once | INTRAVENOUS | Status: AC
Start: 2018-09-12 — End: 2018-09-12
  Administered 2018-09-12: 16:00:00 1000 mL via INTRAVENOUS

## 2018-09-12 MED ORDER — MECLIZINE HCL 12.5 MG PO TABS
12.5 MG | Freq: Once | ORAL | Status: AC
Start: 2018-09-12 — End: 2018-09-12
  Administered 2018-09-12: 16:00:00 25 mg via ORAL

## 2018-09-12 MED FILL — MECLIZINE HCL 12.5 MG PO TABS: 12.5 mg | ORAL | Qty: 2

## 2018-09-12 MED FILL — SODIUM CHLORIDE 0.9 % IV SOLN: 0.9 % | INTRAVENOUS | Qty: 1000

## 2018-09-12 NOTE — ED Notes (Signed)
Pt ambulatory to and from bathroom with daughter as stand-by assist.  Tolerated well and reports "feeling better" after PO Meclizine and fluids.      Toula MoosBrittney Tyonna Talerico, RN  09/12/18 1330

## 2018-09-12 NOTE — ED Triage Notes (Signed)
Pt presents to ED with CC of hx of a fall 3 weeks ago.  Pt has scabs on legs bilaterally in various stages of healing.  Pt states that she feels like something is in the right leg.  Pt also has a CC of constant dizziness for 2 weeks.  Pt has a hx of intermittent dizziness, however this dizziness has been consistent.  Pt on baby aspirin daily.  Pt denies hitting head or syncope at fall.

## 2018-09-12 NOTE — ED Provider Notes (Signed)
Emergency Department Provider Note  Location: [facility]  ED  09/12/2018     Patient Identification  Lauren Bryant is a 84 y.o. female    Chief Complaint  Dizziness (2x weeks consistently.  Hx of intermittent dizziness.) and Fall (3 weeks ago, scabs in various stages of healing.  Denies syncope or hitting head.)      Mode of Arrival  private car    HPI  (History provided by patient and her daughter)  This is a 84 y.o. female with a PMH significant for vertigo, anemia, recurrent UTI on daily abx prophylaxis presented today for dizziness for 2 weeks.  Patient has a known history of vertigo.  She states her dizziness is a mixed room spinning sensation as well as lightheadedness.  Dizziness is significantly worse with position change, especially on standing.  She has been told in the past that she does not drink enough water and admits this continues to be a problem.  She denies fever, cough, URI symptoms.  She also denies focal weakness.  No vision changes.  She is on antibiotic prophylaxis for UTI and denies dysuria, urinary frequency, urgency.  She fell about 3 weeks ago and had some wounds on her leg that scabbed over.  She cannot tell if they are infected.  She is not sure if that is contributing to the dizziness.  Otherwise she has no other complaint.     ROS  Review of Systems   Constitutional: Negative for fever.   HENT: Negative for congestion.    Eyes: Negative for visual disturbance.   Respiratory: Negative for shortness of breath.    Cardiovascular: Negative for chest pain.   Gastrointestinal: Negative for abdominal pain, blood in stool, diarrhea and vomiting.   Genitourinary: Negative for dysuria and frequency.   Musculoskeletal: Negative for joint swelling.   Skin: Positive for wound (3 areas on bilateral lower legs, scabbed). Negative for rash.   Neurological: Positive for dizziness, weakness (generalized weakness) and light-headedness. Negative for syncope, facial asymmetry, speech  difficulty, numbness and headaches.   Hematological:        Patient has known anemia and had extensive workup per daughter. Followed by Heme/Onc and on iron pill.   Psychiatric/Behavioral: Negative for confusion.   All other systems reviewed and are negative.       I have reviewed the following nursing documentation:  Allergies:   Allergies   Allergen Reactions   ??? Macrobid [Nitrofurantoin] Rash       Past medical history:  has a past medical history of Hyperlipidemia and TIA (2015).    Past surgical history:  has a past surgical history that includes Hysterectomy and Tubal ligation.    Home medications:   Prior to Admission medications    Medication Sig Start Date End Date Taking? Authorizing Provider   venlafaxine (EFFEXOR) 37.5 MG tablet Take 37.5 mg by mouth daily   Yes Historical Provider, MD   trimethoprim (TRIMPEX) 100 MG tablet Take 100 mg by mouth daily   Yes Historical Provider, MD   melatonin 3 MG TABS tablet Take 5 mg by mouth nightly as needed   Yes Historical Provider, MD   ferrous sulfate 325 (65 Fe) MG tablet Take 325 mg by mouth daily (with breakfast)   Yes Historical Provider, MD   simvastatin (ZOCOR) 20 MG tablet Take 20 mg by mouth nightly   Yes Historical Provider, MD   tolterodine (DETROL LA) 4 MG ER capsule Take 4 mg by mouth daily  Yes Historical Provider, MD   aspirin 81 MG tablet Take 81 mg by mouth daily   Yes Historical Provider, MD   ALPRAZolam (XANAX) 0.25 MG tablet Take 0.25 mg by mouth 3 times daily as needed for Sleep   Yes Historical Provider, MD       Social history:  reports that she has been smoking cigarettes. She has been smoking about 0.50 packs per day. She has never used smokeless tobacco. She reports that she does not drink alcohol.  Patient lives in [place].  She is currently visiting her daughter in [place].     Family history:  History reviewed. No pertinent family history.    Exam  ED Triage Vitals [09/12/18 1025]   BP Temp Temp Source Pulse Resp SpO2 Height  Weight   (!) 126/53 97.4 ??F (36.3 ??C) Oral 78 18 100 % 5\' 5"  (1.651 m) 126 lb (57.2 kg)   Physical Exam   Constitutional: She is oriented to person, place, and time. She appears well-developed and well-nourished. No distress.   HENT:   Head: Normocephalic and atraumatic.   Eyes: Pupils are equal, round, and reactive to light. Conjunctivae and lids are normal. Right eye exhibits no discharge. Left eye exhibits no discharge. No scleral icterus. Right eye exhibits no nystagmus. Left eye exhibits no nystagmus.   Neck: Neck supple. No tracheal deviation present.   Cardiovascular: Normal rate, regular rhythm and intact distal pulses.   Murmur heard.   Systolic murmur is present with a grade of 4/6.  Pulmonary/Chest: Effort normal and breath sounds normal. No stridor. No respiratory distress. She has no wheezes.   Abdominal: Soft. She exhibits no distension. There is no tenderness. There is no rebound and no guarding.   Musculoskeletal: She exhibits no edema or deformity.   Neurological: She is alert and oriented to person, place, and time. She has normal strength. She displays no tremor. She exhibits normal muscle tone.   No facial droop, no slurred speech   Skin: Skin is warm and dry. Abrasion (3 separate area on bilateral lower legs; scabbed; hyperemia of the wound edge normal, no concern for cellulitis) noted. No rash noted. She is not diaphoretic. No cyanosis. No pallor.   Psychiatric: She has a normal mood and affect. Her speech is normal.   Nursing note and vitals reviewed.      MDM/ED Course    ED Medication Orders (From admission, onward)    Start Ordered     Status Ordering Provider    09/12/18 1145 09/12/18 1134  meclizine (ANTIVERT) tablet 25 mg  ONCE      Last MAR action:  Given - by PULLEN, BRITTNEY N. on 09/12/18 at [address] [place], Jillian Pianka M    09/12/18 1145 09/12/18 1136  0.9 % sodium chloride bolus  ONCE      Last MAR action:  Stopped - by Toula Moos N. on 09/12/18 at 1312 [place], Denaisha Swango M          EKG  The  Ekg interpreted by me in the absence of a cardiologist shows.  normal sinus rhythm with a rate of 71  Axis is   Normal  QTc is  normal  Incomplete RBBB - old   No specific ST-T wave changes appreciated.  No evidence of acute ischemia.   Compared to prior EKG dated March 20, 2017, patient's heart rate improved and there is also no PVC today      Radiology  Xr Chest Standard (2 Vw)  Result Date: 09/12/2018  EXAMINATION: TWO XRAY VIEWS OF THE CHEST 09/12/2018 10:58 am COMPARISON: Frontal view of the chest 03/20/2017. HISTORY: ORDERING SYSTEM PROVIDED HISTORY: dizziness TECHNOLOGIST PROVIDED HISTORY: Reason for exam:->dizziness Reason for Exam: dizziness Acuity: Acute Type of Exam: Initial FINDINGS: The cardiopericardial silhouette is stable in size and configuration. Atherosclerotic calcification of the thoracic aorta is again noted. The lungs are without pneumothorax, pleural effusion or new focal airspace opacity. Redemonstration of mild bilateral interstitial prominence most pronounced in the lower lobes, likely reflecting underlying chronic lung disease.     1. The lungs are without pneumothorax, pleural effusion or new focal airspace opacity. 2. Redemonstration of mild bilateral interstitial prominence most pronounced in the lower lobes, likely reflecting underlying chronic lung disease.     Ct Head Wo Contrast    Result Date: 09/12/2018  EXAMINATION: CT OF THE HEAD WITHOUT CONTRAST  09/12/2018 11:44 am TECHNIQUE: CT of the head was performed without the administration of intravenous contrast. Dose modulation, iterative reconstruction, and/or weight based adjustment of the mA/kV was utilized to reduce the radiation dose to as low as reasonably achievable. COMPARISON: None. HISTORY: ORDERING SYSTEM PROVIDED HISTORY: dizziness x 2 weeks TECHNOLOGIST PROVIDED HISTORY: Has a "code stroke" or "stroke alert" been called?->No Reason for Exam: worsening dizziness x 2 wks Acuity: Acute Type of Exam: Initial Additional signs  and symptoms: hx-TIA 5 yrs ago FINDINGS: BRAIN/VENTRICLES: There is prominence of the ventricles and cortical sulci consistent with cortical volume loss. No midline shift. Basal cisterns are normally outlined. There is periventricular and subcortical white matter discrete and confluent low attenuation, nonspecific, likely representing age-related chronic small vessel ischemic disease. There is no evidence for acute infarct. No acute intra or extra-axial hemorrhage. ORBITS: The visualized portion of the orbits demonstrate no acute abnormality. SINUSES: The visualized paranasal sinuses and mastoid air cells demonstrate no acute abnormality. SOFT TISSUES/SKULL:  No acute abnormality of the visualized skull or soft tissues.     No acute intracranial abnormality. Cerebral atrophy.        Labs  Results for orders placed or performed during the hospital encounter of 09/12/18   Comprehensive Metabolic Panel   Result Value Ref Range    Sodium 138 136 - 145 mmol/L    Potassium 4.3 3.5 - 5.1 mmol/L    Chloride 102 99 - 110 mmol/L    CO2 26 21 - 32 mmol/L    Anion Gap 10 3 - 16    Glucose 113 (H) 70 - 99 mg/dL    BUN 19 7 - 20 mg/dL    CREATININE 0.9 0.6 - 1.2 mg/dL    GFR Non-African American 58 (A) >60    GFR African American >60 >60    Calcium 9.6 8.3 - 10.6 mg/dL    Total Protein 6.5 6.4 - 8.2 g/dL    Alb 4.2 3.4 - 5.0 g/dL    Albumin/Globulin Ratio 1.8 1.1 - 2.2    Total Bilirubin 0.3 0.0 - 1.0 mg/dL    Alkaline Phosphatase 57 40 - 129 U/L    ALT 8 (L) 10 - 40 U/L    AST 15 15 - 37 U/L    Globulin 2.3 g/dL   CBC Auto Differential   Result Value Ref Range    WBC 6.2 4.0 - 11.0 K/uL    RBC 2.68 (L) 4.00 - 5.20 M/uL    Hemoglobin 7.7 (L) 12.0 - 16.0 g/dL    Hematocrit 09.823.3 (L) 36.0 - 48.0 %    MCV  87.1 80.0 - 100.0 fL    MCH 28.7 26.0 - 34.0 pg    MCHC 32.9 31.0 - 36.0 g/dL    RDW 16.1 (H) 09.6 - 15.4 %    Platelets 205 135 - 450 K/uL    MPV 10.4 5.0 - 10.5 fL    Neutrophils % 74.4 %    Lymphocytes % 12.3 %    Monocytes %  10.2 %    Eosinophils % 2.2 %    Basophils % 0.9 %    Neutrophils Absolute 4.6 1.7 - 7.7 K/uL    Lymphocytes Absolute 0.8 (L) 1.0 - 5.1 K/uL    Monocytes Absolute 0.6 0.0 - 1.3 K/uL    Eosinophils Absolute 0.1 0.0 - 0.6 K/uL    Basophils Absolute 0.1 0.0 - 0.2 K/uL   Troponin   Result Value Ref Range    Troponin <0.01 <0.01 ng/mL   Urinalysis, reflex to microscopic   Result Value Ref Range    Color, UA Yellow Straw/Yellow    Clarity, UA Clear Clear    Glucose, Ur Negative Negative mg/dL    Bilirubin Urine Negative Negative    Ketones, Urine Negative Negative mg/dL    Specific Gravity, UA 1.020 1.005 - 1.030    Blood, Urine Negative Negative    pH, UA 6.0 5.0 - 8.0    Protein, UA TRACE (A) Negative mg/dL    Urobilinogen, Urine 1.0 <2.0 E.U./dL    Nitrite, Urine Negative Negative    Leukocyte Esterase, Urine Negative Negative    Microscopic Examination YES     Urine Type Not Specified    Microscopic Urinalysis   Result Value Ref Range    Mucus, UA 2+ (A) /LPF    WBC, UA 3-5 0 - 5 /HPF    RBC, UA 0-2 0 - 2 /HPF       - Patient seen and evaluated in room 6.  84 y.o. female presented for dizziness.  Patient does have a history of vertigo and also does not drink enough water.  Orthostatic vital signs obtained and notable for heart rate increased from 60s to 90s, supine to standing.  A liter of fluids ordered in addition to meclizine.  On reassessment, patient reported feeling much better.  - Old records reviewed, specifically prior lab reviewed for comparison.  However patient normally lives in [place] so our prior lab from 2018.  Notable today is anemia but daughter said that has been worked up extensively in the past year.  Patient is followed by hematologist and oncologist in [facility] and currently on iron pill.  - CT head did not show tumor, which was daughter's primary concern.  She felt reassured.  - Lab otherwise unremarkable.  No UTI today.  Patient felt better after fluids and meclizine and was ready  to go home.  - Return precautions also discussed. Patient verbalized understanding of care plan and agreed to follow-up with PCP as advised.    I estimate there is LOW risk for ACUTE CORONARY SYNDROME, INTRACRANIAL HEMORRHAGE, MALIGNANT DYSRHYTHMIA, MENINGITIS, PNEUMONIA, PULMONARY EMBOLISM, SEPSIS, SUBARACHNOID HEMORRHAGE, SUBDURAL HEMATOMA, or STROKE, thus I consider the discharge disposition reasonable. Ursula Beath and I have discussed the diagnosis and risks, and we agree with discharging home to follow-up with PCP. We also discussed returning to the Emergency Department immediately if new or worsening symptoms occur. We have discussed the symptoms which are most concerning (e.g., changing or worsening pain, weakness, vomiting, fever) that necessitate immediate return.  Clinical Impression:  1. Dizziness    2. Dehydration        Disposition:  Discharge to home in good condition.    Blood pressure (!) 126/53, pulse 78, temperature 97.4 ??F (36.3 ??C), temperature source Oral, resp. rate 18, height 5\' 5"  (1.651 m), weight 126 lb (57.2 kg), SpO2 100 %.    Patient was given scripts for the following medications. I counseled patient how to take these medications.   Discharge Medication List as of 09/12/2018  1:45 PM      START taking these medications    Details   meclizine (ANTIVERT) 25 MG tablet Take 1 tablet by mouth 3 times daily as needed for Dizziness, Disp-15 tablet, R-0Print             Disposition referral (if applicable):  Your primary care in [facility]    Schedule an appointment as soon as possible for a visit         This chart was generated in part by using Dragon Dictation system and may contain errors related to that system including errors in grammar, punctuation, and spelling, as well as words and phrases that may be inappropriate. If there are any questions or concerns please feel free to contact the dictating provider for clarification.     Donzetta Sprung, MD  [facility]       Leanne Lovely, MD  09/13/18 [phone]

## 2018-11-11 ENCOUNTER — Ambulatory Visit: Payer: Medicare Other | Admitting: Urology

## 2018-12-02 ENCOUNTER — Ambulatory Visit (INDEPENDENT_AMBULATORY_CARE_PROVIDER_SITE_OTHER): Payer: Medicare Other | Admitting: Urology

## 2018-12-02 VITALS — BP 83/49 | HR 91 | Ht 65.0 in | Wt 121.0 lb

## 2018-12-02 DIAGNOSIS — N3020 Other chronic cystitis without hematuria: Secondary | ICD-10-CM

## 2018-12-02 DIAGNOSIS — N3946 Mixed incontinence: Secondary | ICD-10-CM

## 2018-12-02 DIAGNOSIS — N390 Urinary tract infection, site not specified: Secondary | ICD-10-CM

## 2018-12-02 MED ORDER — TRIMETHOPRIM 100 MG PO TABS: 100.0000 mg | ORAL_TABLET | Freq: Every day | ORAL | 3 refills | Status: DC

## 2018-12-02 NOTE — Progress Notes (Signed)
12/02/2018 3:58 PM   Yarixa Annye Asa 1924-08-26 [id number]  Referring provider: Jerl Mina, MD [address] [place], [place] [address]  Chief Complaint  Patient presents with  . Follow-up    HPI: Consulted to assess the patient's recurrent bladder infections. Some of the details were difficult to ascertain but it appears that she overall feels poorly when she gets a bladder infection and she may have had 4 or 5 in the last year. She has not been hospitalized with fever. Her son was helpful.  At baseline she gets up 3-4 times a night. She wears 1 pad during the day and 2 during the night.  I reviewed the medical record and I did not see urine cultures or a recent renal x-ray  We spoke about recurrent bladder infections and the role of a renal ultrasound. Her urinalysis was normal today but I sent her urine for culture. I thought it was reasonable to start her on prophylaxis and when I see her back in about 6weeks to get a renal ultrasound. I did not perform cystoscopy. At this stage I will not evaluate or treat her incontinence though it may improve in the future if we can control her urinary tract infection  A Frequency stable.  Renal ultrasound within normal limits noted Patient still going quite frequently especially at night affecting her quality life and clinically not infected.    PMH: Past Medical History:  Diagnosis Date  . Allergy   . Cataract     Surgical History: Past Surgical History:  Procedure Laterality Date  . ABDOMINAL HYSTERECTOMY    . COLONOSCOPY  2003    Home Medications:  Allergies as of 12/02/2018      Reactions   Nitrofurantoin Rash      Medication List        Accurate as of 12/02/18  3:58 PM. Always use your most recent med list.          ALPRAZolam 0.25 MG tablet Commonly known as:  XANAX Take 0.25 mg by mouth at bedtime as needed for anxiety.   aspirin 325 MG tablet Take 325 mg by mouth daily.     ferrous sulfate 325 (65 FE) MG EC tablet Take 65 mg by mouth daily.   lidocaine 5 % Commonly known as:  LIDODERM Apply patch to the most painful area for up to 12 hours in a 24 hour period.   MACULAR VITAMIN BENEFIT PO Take by mouth.   MELATONIN PO Take by mouth.   OSTEO BI-FLEX JOINT SHIELD PO Take by mouth.   simvastatin 20 MG tablet Commonly known as:  ZOCOR TAKE 1 TABLET DAILY   STOOL SOFTENER PO Take by mouth.   tolterodine 4 MG 24 hr capsule Commonly known as:  DETROL LA Take 4 mg by mouth daily.   trimethoprim 100 MG tablet Commonly known as:  TRIMPEX Take 1 tablet (100 mg total) by mouth daily.   venlafaxine 37.5 MG tablet Commonly known as:  EFFEXOR Take 37.5 mg by mouth 2 (two) times daily.       Allergies:  Allergies  Allergen Reactions  . Nitrofurantoin Rash    Family History: Family History  Problem Relation Age of Onset  . Cancer Sister   . Bladder Cancer Neg Hx   . Kidney cancer Neg Hx     Social History:  reports that she has been smoking. She has a 12.50 pack-year smoking history. She has never used smokeless tobacco. She  reports that she does not drink alcohol or use drugs.  ROS: UROLOGY Hard to postpone urination?: Yes Get up at night to urinate?: Yes                                      Physical Exam: BP (!) 83/49   Pulse 91   Ht 5\' 5"  (1.651 m)   Wt 121 lb (54.9 kg)   BMI 20.14 kg/m   Constitutional:  Alert and oriented, No acute distress.   Laboratory Data: Lab Results  Component Value Date   WBC 6.9 08/22/2017   HGB 11.5 (L) 08/22/2017   HCT 34.2 (L) 08/22/2017   MCV 90.7 08/22/2017   PLT 145 (L) 08/22/2017    No results found for: CREATININE  No results found for: PSA  No results found for: TESTOSTERONE  No results found for: HGBA1C  Urinalysis    Component Value Date/Time   COLORURINE YELLOW (A) 05/07/2017 1455   APPEARANCEUR Clear 05/27/2018 1310   LABSPEC 1.008 05/07/2017  1455   PHURINE 7.0 05/07/2017 1455   GLUCOSEU Negative 05/27/2018 1310   HGBUR NEGATIVE 05/07/2017 1455   BILIRUBINUR Negative 05/27/2018 1310   KETONESUR NEGATIVE 05/07/2017 1455   PROTEINUR Negative 05/27/2018 1310   PROTEINUR NEGATIVE 05/07/2017 1455   NITRITE Negative 05/27/2018 1310   NITRITE POSITIVE (A) 05/07/2017 1455   LEUKOCYTESUR Negative 05/27/2018 1310    Pertinent Imaging:   Assessment & Plan: Reassess in 6 weeks on Myrbetriq samples and prescription and renewed the trimethoprim  1. Chronic cystitis  - Urinalysis, Complete   No follow-ups on file.  Martina SinnerMACDIARMID,Saban Heinlen A, MD  [facility] [address], [address] [place], [place] [address] [phone]

## 2019-01-08 ENCOUNTER — Other Ambulatory Visit: Payer: Self-pay | Admitting: Family Medicine

## 2019-01-08 DIAGNOSIS — I739 Peripheral vascular disease, unspecified: Secondary | ICD-10-CM

## 2019-01-13 ENCOUNTER — Ambulatory Visit
Admission: RE | Admit: 2019-01-13 | Discharge: 2019-01-13 | Disposition: A | Discharge status: Home / Self Care | Payer: Medicare Other | Source: Ambulatory Visit | Attending: Family Medicine | Admitting: Family Medicine

## 2019-01-13 DIAGNOSIS — I739 Peripheral vascular disease, unspecified: Secondary | ICD-10-CM | POA: Diagnosis not present

## 2019-01-20 ENCOUNTER — Encounter: Payer: Self-pay | Admitting: Urology

## 2019-01-20 ENCOUNTER — Ambulatory Visit (INDEPENDENT_AMBULATORY_CARE_PROVIDER_SITE_OTHER): Payer: Medicare Other | Admitting: Urology

## 2019-01-20 VITALS — BP 111/64 | HR 90 | Ht 65.0 in | Wt 128.0 lb

## 2019-01-20 DIAGNOSIS — N3020 Other chronic cystitis without hematuria: Secondary | ICD-10-CM

## 2019-01-20 DIAGNOSIS — N390 Urinary tract infection, site not specified: Secondary | ICD-10-CM

## 2019-01-20 MED ORDER — TRIMETHOPRIM 100 MG PO TABS: 100.0000 mg | ORAL_TABLET | Freq: Every day | ORAL | 3 refills | Status: DC

## 2019-01-20 MED ORDER — MIRABEGRON ER 50 MG PO TB24: 50.0000 mg | ORAL_TABLET | Freq: Every day | ORAL | 11 refills | Status: DC

## 2019-01-20 NOTE — Progress Notes (Signed)
01/20/2019 2:59 PM   Linda Owens 1924-05-31 [id number]  Referring provider: Jerl Mina, MD [address] [facility] [place], [place] [address]  Chief Complaint  Patient presents with  . Chronic cystitis    6 week follow up    HPI: Consulted to assess the patient's recurrent bladder infections. Some of the details were difficult to ascertain but it appears that she overall feels poorly when she gets a bladder infection and she may have had 4 or 5 in the last year. She has not been hospitalized with fever. Her son was helpful.  At baseline she gets up 3-4 times a night. She wears 1 pad during the day and 2 during the night.  I reviewed the medical record and I did not see urine cultures or a recent renal x-ray  We spoke about recurrent bladder infections and the role of a renal ultrasound. Her urinalysis was normal today but I sent her urine for culture. I thought it was reasonable to start her on prophylaxis and when I see her back in about 6weeks to get a renal ultrasound. I did not perform cystoscopy. At this stage I will not evaluate or treat her incontinence though it may improve in the future if we can control her urinary tract infection  Frequency stable.  Renal ultrasound within normal limits noted Patient still going quite frequently especially reassess in 6 weeks on Myrbetriq and trimethoprim  Today Infections on trimethoprim prescription renewed Much less frequency on Myrbetriq samples and prescription given   PMH: Past Medical History:  Diagnosis Date  . Allergy   . Cataract     Surgical History: Past Surgical History:  Procedure Laterality Date  . ABDOMINAL HYSTERECTOMY    . COLONOSCOPY  2003    Home Medications:  Allergies as of 01/20/2019      Reactions   Nitrofurantoin Rash      Medication List       Accurate as of January 20, 2019  2:59 PM. Always use your most recent med list.        ALPRAZolam 0.25 MG  tablet Commonly known as:  XANAX Take 0.25 mg by mouth at bedtime as needed for anxiety.   aspirin 325 MG tablet Take 325 mg by mouth daily.   ferrous sulfate 325 (65 FE) MG EC tablet Take 65 mg by mouth daily.   lidocaine 5 % Commonly known as:  LIDODERM Apply patch to the most painful area for up to 12 hours in a 24 hour period.   MACULAR VITAMIN BENEFIT PO Take by mouth.   MELATONIN PO Take by mouth.   OSTEO BI-FLEX JOINT SHIELD PO Take by mouth.   SF 5000 PLUS 1.1 % Crea dental cream Generic drug:  sodium fluoride USE BID. DO NOT RINSE AFTERWARDS   simvastatin 20 MG tablet Commonly known as:  ZOCOR TAKE 1 TABLET DAILY   STOOL SOFTENER PO Take by mouth.   tolterodine 4 MG 24 hr capsule Commonly known as:  DETROL LA Take 4 mg by mouth daily.   trimethoprim 100 MG tablet Commonly known as:  TRIMPEX Take 1 tablet (100 mg total) by mouth daily.   venlafaxine 37.5 MG tablet Commonly known as:  EFFEXOR Take 37.5 mg by mouth 2 (two) times daily.       Allergies:  Allergies  Allergen Reactions  . Nitrofurantoin Rash    Family History: Family History  Problem Relation Age of Onset  . Cancer Sister   .  Bladder Cancer Neg Hx   . Kidney cancer Neg Hx     Social History:  reports that she has been smoking. She has a 12.50 pack-year smoking history. She has never used smokeless tobacco. She reports that she does not drink alcohol or use drugs.  ROS:                                        Physical Exam: There were no vitals taken for this visit.  Constitutional:  Alert and oriented, No acute distress.  Lab Results  Component Value Date   WBC 6.9 08/22/2017   HGB 11.5 (L) 08/22/2017   HCT 34.2 (L) 08/22/2017   MCV 90.7 08/22/2017   PLT 145 (L) 08/22/2017    No results found for: CREATININE  No results found for: PSA  No results found for: TESTOSTERONE  No results found for: HGBA1C  Urinalysis    Component Value  Date/Time   COLORURINE YELLOW (A) 05/07/2017 1455   APPEARANCEUR Clear 05/27/2018 1310   LABSPEC 1.008 05/07/2017 1455   PHURINE 7.0 05/07/2017 1455   GLUCOSEU Negative 05/27/2018 1310   HGBUR NEGATIVE 05/07/2017 1455   BILIRUBINUR Negative 05/27/2018 1310   KETONESUR NEGATIVE 05/07/2017 1455   PROTEINUR Negative 05/27/2018 1310   PROTEINUR NEGATIVE 05/07/2017 1455   NITRITE Negative 05/27/2018 1310   NITRITE POSITIVE (A) 05/07/2017 1455   LEUKOCYTESUR Negative 05/27/2018 1310    Pertinent Imaging:   Assessment & Plan: Reassess in 1 year  1. Chronic cystitis  - Urinalysis, Complete   No follow-ups on file.  Martina SinnerScott A Nelma Phagan, MD  [facility] [address], [address] [place], [place] [address] [phone]

## 2019-02-14 ENCOUNTER — Telehealth: Payer: Self-pay

## 2019-02-14 DIAGNOSIS — N3020 Other chronic cystitis without hematuria: Secondary | ICD-10-CM

## 2019-02-14 DIAGNOSIS — N3946 Mixed incontinence: Secondary | ICD-10-CM

## 2019-02-14 NOTE — Telephone Encounter (Signed)
Myrbetriq was prescribed by this patient but is not covered by her insurance, they preferred drug by her insurance is Oxybutynin Chloride. Please advise.

## 2019-02-14 NOTE — Telephone Encounter (Signed)
Oxybutynin ER 10 mg 30x11 

## 2019-02-17 ENCOUNTER — Other Ambulatory Visit: Payer: Self-pay

## 2019-02-17 MED ORDER — OXYBUTYNIN CHLORIDE ER 10 MG PO TB24: 10.0000 mg | ORAL_TABLET | Freq: Every day | ORAL | 3 refills | Status: DC

## 2019-02-17 NOTE — Telephone Encounter (Signed)
Called pt's daughter informed her that pt's insurance will not cover Myrbetriq however they will cover Oxybutynin, new RX sent in for 90 day supply per daughter's request.

## 2019-02-26 ENCOUNTER — Telehealth: Payer: Self-pay

## 2019-02-27 NOTE — Telephone Encounter (Signed)
No action needed

## 2019-04-28 ENCOUNTER — Other Ambulatory Visit: Payer: Self-pay | Admitting: Urology

## 2019-04-28 DIAGNOSIS — N3946 Mixed incontinence: Secondary | ICD-10-CM

## 2019-04-28 DIAGNOSIS — N390 Urinary tract infection, site not specified: Secondary | ICD-10-CM

## 2019-04-28 DIAGNOSIS — N3020 Other chronic cystitis without hematuria: Secondary | ICD-10-CM

## 2019-04-28 MED ORDER — TRIMETHOPRIM 100 MG PO TABS: 100.0000 mg | ORAL_TABLET | Freq: Every day | ORAL | 3 refills | Status: DC

## 2019-04-28 MED ORDER — OXYBUTYNIN CHLORIDE ER 10 MG PO TB24: 10.0000 mg | ORAL_TABLET | Freq: Every day | ORAL | 3 refills | Status: DC

## 2019-04-28 NOTE — Telephone Encounter (Signed)
Pt's daughter [facility] and states that pt needs refills for Trimethoprim and Oxybutynin sent to [facility] for 90 day supply.

## 2019-04-28 NOTE — Addendum Note (Signed)
Addended by: Martha Clan on: 04/28/2019 11:29 AM   Modules accepted: Orders

## 2019-04-28 NOTE — Telephone Encounter (Signed)
Refill sent.

## 2019-11-24 ENCOUNTER — Telehealth: Payer: Self-pay | Admitting: Urology

## 2019-11-24 DIAGNOSIS — N390 Urinary tract infection, site not specified: Secondary | ICD-10-CM

## 2019-11-24 MED ORDER — TRIMETHOPRIM 100 MG PO TABS: 100.0000 mg | ORAL_TABLET | Freq: Every day | ORAL | 0 refills | Status: DC

## 2019-11-24 NOTE — Telephone Encounter (Signed)
Pt's daughter called and states that the pt needs a refill for Trimethoprin sent into [facility] for 90/ day supply.

## 2019-11-24 NOTE — Telephone Encounter (Signed)
90day script sent with no refills, patient due for a yearly follow up in Jan 2021 and will need to keep for additional refills

## 2020-01-14 ENCOUNTER — Telehealth: Payer: Self-pay | Admitting: Urology

## 2020-01-14 DIAGNOSIS — N3946 Mixed incontinence: Secondary | ICD-10-CM

## 2020-01-14 DIAGNOSIS — N390 Urinary tract infection, site not specified: Secondary | ICD-10-CM

## 2020-01-14 DIAGNOSIS — N3020 Other chronic cystitis without hematuria: Secondary | ICD-10-CM

## 2020-01-14 NOTE — Telephone Encounter (Signed)
Spoke with daughter not having any urinary symptoms-would like to follow up virtually.

## 2020-01-14 NOTE — Telephone Encounter (Signed)
Pt's daughter requests a VIRTUAL appt, due to her Mother's age and she also states that pt is not having any issues.

## 2020-01-15 NOTE — Telephone Encounter (Signed)
Yes.  OK to refill. 

## 2020-01-19 ENCOUNTER — Ambulatory Visit: Payer: Medicare Other | Admitting: Urology

## 2020-01-19 MED ORDER — OXYBUTYNIN CHLORIDE ER 10 MG PO TB24: 10.0000 mg | ORAL_TABLET | Freq: Every day | ORAL | 3 refills | Status: DC

## 2020-01-19 MED ORDER — TRIMETHOPRIM 100 MG PO TABS: 100.0000 mg | ORAL_TABLET | Freq: Every day | ORAL | 3 refills | Status: AC

## 2020-01-19 NOTE — Telephone Encounter (Signed)
Daughter Kendal Hymen notified, voiced understanding. RX sent to [facility] as requested.

## 2020-03-02 ENCOUNTER — Inpatient Hospital Stay
Admission: EM | Admit: 2020-03-02 | Discharge: 2020-03-05 | DRG: 552 | Disposition: A | Discharge status: Skilled Nursing Facility | Payer: Medicare Other | Attending: Internal Medicine | Admitting: Internal Medicine

## 2020-03-02 ENCOUNTER — Other Ambulatory Visit: Payer: Self-pay

## 2020-03-02 ENCOUNTER — Emergency Department: Payer: Medicare Other

## 2020-03-02 DIAGNOSIS — H9190 Unspecified hearing loss, unspecified ear: Secondary | ICD-10-CM | POA: Diagnosis present

## 2020-03-02 DIAGNOSIS — W010XXA Fall on same level from slipping, tripping and stumbling without subsequent striking against object, initial encounter: Secondary | ICD-10-CM | POA: Diagnosis present

## 2020-03-02 DIAGNOSIS — K5900 Constipation, unspecified: Secondary | ICD-10-CM | POA: Diagnosis present

## 2020-03-02 DIAGNOSIS — E785 Hyperlipidemia, unspecified: Secondary | ICD-10-CM | POA: Diagnosis present

## 2020-03-02 DIAGNOSIS — D649 Anemia, unspecified: Secondary | ICD-10-CM

## 2020-03-02 DIAGNOSIS — D638 Anemia in other chronic diseases classified elsewhere: Secondary | ICD-10-CM | POA: Diagnosis present

## 2020-03-02 DIAGNOSIS — Z79899 Other long term (current) drug therapy: Secondary | ICD-10-CM

## 2020-03-02 DIAGNOSIS — G8929 Other chronic pain: Secondary | ICD-10-CM | POA: Diagnosis present

## 2020-03-02 DIAGNOSIS — M48061 Spinal stenosis, lumbar region without neurogenic claudication: Secondary | ICD-10-CM | POA: Diagnosis present

## 2020-03-02 DIAGNOSIS — N3281 Overactive bladder: Secondary | ICD-10-CM | POA: Diagnosis present

## 2020-03-02 DIAGNOSIS — Z87440 Personal history of urinary (tract) infections: Secondary | ICD-10-CM

## 2020-03-02 DIAGNOSIS — D801 Nonfamilial hypogammaglobulinemia: Secondary | ICD-10-CM | POA: Diagnosis present

## 2020-03-02 DIAGNOSIS — Z7982 Long term (current) use of aspirin: Secondary | ICD-10-CM | POA: Diagnosis not present

## 2020-03-02 DIAGNOSIS — R55 Syncope and collapse: Secondary | ICD-10-CM

## 2020-03-02 DIAGNOSIS — R110 Nausea: Secondary | ICD-10-CM | POA: Diagnosis present

## 2020-03-02 DIAGNOSIS — Z20822 Contact with and (suspected) exposure to covid-19: Secondary | ICD-10-CM | POA: Diagnosis present

## 2020-03-02 DIAGNOSIS — W19XXXA Unspecified fall, initial encounter: Secondary | ICD-10-CM

## 2020-03-02 DIAGNOSIS — Y92096 Garden or yard of other non-institutional residence as the place of occurrence of the external cause: Secondary | ICD-10-CM

## 2020-03-02 DIAGNOSIS — F329 Major depressive disorder, single episode, unspecified: Secondary | ICD-10-CM | POA: Diagnosis present

## 2020-03-02 DIAGNOSIS — N390 Urinary tract infection, site not specified: Secondary | ICD-10-CM | POA: Diagnosis present

## 2020-03-02 DIAGNOSIS — Z8673 Personal history of transient ischemic attack (TIA), and cerebral infarction without residual deficits: Secondary | ICD-10-CM

## 2020-03-02 DIAGNOSIS — Y9389 Activity, other specified: Secondary | ICD-10-CM

## 2020-03-02 DIAGNOSIS — S22020A Wedge compression fracture of second thoracic vertebra, initial encounter for closed fracture: Secondary | ICD-10-CM | POA: Diagnosis present

## 2020-03-02 DIAGNOSIS — D509 Iron deficiency anemia, unspecified: Secondary | ICD-10-CM | POA: Diagnosis present

## 2020-03-02 DIAGNOSIS — S22089A Unspecified fracture of T11-T12 vertebra, initial encounter for closed fracture: Secondary | ICD-10-CM

## 2020-03-02 DIAGNOSIS — F32A Depression, unspecified: Secondary | ICD-10-CM | POA: Diagnosis present

## 2020-03-02 DIAGNOSIS — F17210 Nicotine dependence, cigarettes, uncomplicated: Secondary | ICD-10-CM | POA: Diagnosis present

## 2020-03-02 DIAGNOSIS — S22088A Other fracture of T11-T12 vertebra, initial encounter for closed fracture: Principal | ICD-10-CM | POA: Diagnosis present

## 2020-03-02 DIAGNOSIS — Y92009 Unspecified place in unspecified non-institutional (private) residence as the place of occurrence of the external cause: Secondary | ICD-10-CM | POA: Diagnosis not present

## 2020-03-02 LAB — TYPE AND SCREEN
ABO/RH(D): A POS
Antibody Screen: NEGATIVE

## 2020-03-02 LAB — CBC WITH DIFFERENTIAL/PLATELET
Abs Immature Granulocytes: 0.07 10*3/uL (ref 0.00–0.07)
Basophils Absolute: 0.1 10*3/uL (ref 0.0–0.1)
Basophils Relative: 1 %
Eosinophils Absolute: 0.1 10*3/uL (ref 0.0–0.5)
Eosinophils Relative: 1 %
HCT: 25 % — ABNORMAL LOW (ref 36.0–46.0)
Hemoglobin: 7.9 g/dL — ABNORMAL LOW (ref 12.0–15.0)
Immature Granulocytes: 1 %
Lymphocytes Relative: 6 %
Lymphs Abs: 0.7 10*3/uL (ref 0.7–4.0)
MCH: 26.5 pg (ref 26.0–34.0)
MCHC: 31.6 g/dL (ref 30.0–36.0)
MCV: 83.9 fL (ref 80.0–100.0)
Monocytes Absolute: 0.9 10*3/uL (ref 0.1–1.0)
Monocytes Relative: 8 %
Neutro Abs: 9.1 10*3/uL — ABNORMAL HIGH (ref 1.7–7.7)
Neutrophils Relative %: 83 %
Platelets: 184 10*3/uL (ref 150–400)
RBC: 2.98 MIL/uL — ABNORMAL LOW (ref 3.87–5.11)
RDW: 15.1 % (ref 11.5–15.5)
WBC: 10.9 10*3/uL — ABNORMAL HIGH (ref 4.0–10.5)
nRBC: 0 % (ref 0.0–0.2)

## 2020-03-02 LAB — BASIC METABOLIC PANEL
Anion gap: 10 (ref 5–15)
BUN: 16 mg/dL (ref 8–23)
CO2: 24 mmol/L (ref 22–32)
Calcium: 8.9 mg/dL (ref 8.9–10.3)
Chloride: 101 mmol/L (ref 98–111)
Creatinine, Ser: 0.82 mg/dL (ref 0.44–1.00)
GFR calc Af Amer: >60 mL/min (ref >60)
GFR calc non Af Amer: >60 mL/min (ref >60)
Glucose, Bld: 109 mg/dL — ABNORMAL HIGH (ref 70–99)
Potassium: 4.1 mmol/L (ref 3.5–5.1)
Sodium: 135 mmol/L (ref 135–145)

## 2020-03-02 LAB — URINALYSIS, COMPLETE (UACMP) WITH MICROSCOPIC
Bilirubin Urine: NEGATIVE
Glucose, UA: NEGATIVE mg/dL
Hgb urine dipstick: NEGATIVE
Ketones, ur: NEGATIVE mg/dL
Nitrite: POSITIVE — AB
Protein, ur: NEGATIVE mg/dL
Specific Gravity, Urine: 1.012 (ref 1.005–1.030)
pH: 7 (ref 5.0–8.0)

## 2020-03-02 LAB — IRON AND TIBC
Iron: 20 ug/dL — ABNORMAL LOW (ref 28–170)
Saturation Ratios: 5 % — ABNORMAL LOW (ref 10.4–31.8)
TIBC: 412 ug/dL (ref 250–450)
UIBC: 392 ug/dL

## 2020-03-02 LAB — TROPONIN I (HIGH SENSITIVITY)
Troponin I (High Sensitivity): 12 ng/L (ref <18)
Troponin I (High Sensitivity): 13 ng/L (ref <18)

## 2020-03-02 LAB — CBC
HCT: 24.5 % — ABNORMAL LOW (ref 36.0–46.0)
Hemoglobin: 7.6 g/dL — ABNORMAL LOW (ref 12.0–15.0)
MCH: 26.1 pg (ref 26.0–34.0)
MCHC: 31 g/dL (ref 30.0–36.0)
MCV: 84.2 fL (ref 80.0–100.0)
Platelets: 187 10*3/uL (ref 150–400)
RBC: 2.91 MIL/uL — ABNORMAL LOW (ref 3.87–5.11)
RDW: 15.1 % (ref 11.5–15.5)
WBC: 10.8 10*3/uL — ABNORMAL HIGH (ref 4.0–10.5)
nRBC: 0 % (ref 0.0–0.2)

## 2020-03-02 MED ORDER — SODIUM FLUORIDE 1.1 % DT CREA: TOPICAL_CREAM | Freq: Two times a day (BID) | DENTAL | Status: DC

## 2020-03-02 MED ORDER — ALPRAZOLAM 0.25 MG PO TABS
0.2500 mg | ORAL_TABLET | Freq: Every evening | ORAL | Status: DC | PRN
  Administered 2020-03-02: 0.25 mg via ORAL
  Filled 2020-03-02: qty 1

## 2020-03-02 MED ORDER — ASPIRIN 325 MG PO TABS: 325.0000 mg | ORAL_TABLET | Freq: Every day | ORAL | Status: DC

## 2020-03-02 MED ORDER — OCUVITE-LUTEIN PO CAPS
1.0000 | ORAL_CAPSULE | Freq: Every day | ORAL | Status: DC
  Administered 2020-03-03 – 2020-03-05 (×3): 1 via ORAL
  Filled 2020-03-02 (×4): qty 1

## 2020-03-02 MED ORDER — TRIMETHOPRIM 100 MG PO TABS: 100.0000 mg | ORAL_TABLET | Freq: Every day | ORAL | Status: DC

## 2020-03-02 MED ORDER — ONDANSETRON HCL 4 MG/2ML IJ SOLN: 4.0000 mg | Freq: Four times a day (QID) | INTRAMUSCULAR | Status: DC | PRN

## 2020-03-02 MED ORDER — ONDANSETRON HCL 4 MG PO TABS: 4.0000 mg | ORAL_TABLET | Freq: Four times a day (QID) | ORAL | Status: DC | PRN

## 2020-03-02 MED ORDER — TRAMADOL HCL 50 MG PO TABS
50.0000 mg | ORAL_TABLET | Freq: Four times a day (QID) | ORAL | Status: DC | PRN
  Administered 2020-03-02 – 2020-03-04 (×4): 50 mg via ORAL
  Filled 2020-03-02 (×4): qty 1

## 2020-03-02 MED ORDER — SODIUM CHLORIDE 0.9 % IV SOLN
250.0000 mL | INTRAVENOUS | Status: DC | PRN
  Administered 2020-03-03: 250 mL via INTRAVENOUS

## 2020-03-02 MED ORDER — FESOTERODINE FUMARATE ER 4 MG PO TB24: 4.0000 mg | ORAL_TABLET | Freq: Every day | ORAL | Status: DC

## 2020-03-02 MED ORDER — SODIUM CHLORIDE 0.9% FLUSH
3.0000 mL | Freq: Two times a day (BID) | INTRAVENOUS | Status: DC
  Administered 2020-03-02 – 2020-03-04 (×5): 3 mL via INTRAVENOUS

## 2020-03-02 MED ORDER — FERROUS SULFATE 325 (65 FE) MG PO TABS
325.0000 mg | ORAL_TABLET | Freq: Two times a day (BID) | ORAL | Status: DC
  Administered 2020-03-03 – 2020-03-05 (×5): 325 mg via ORAL
  Filled 2020-03-02 (×6): qty 1

## 2020-03-02 MED ORDER — VENLAFAXINE HCL 37.5 MG PO TABS: 37.5000 mg | ORAL_TABLET | Freq: Two times a day (BID) | ORAL | Status: DC

## 2020-03-02 MED ORDER — OXYBUTYNIN CHLORIDE ER 10 MG PO TB24
10.0000 mg | ORAL_TABLET | Freq: Every day | ORAL | Status: DC
  Administered 2020-03-03 – 2020-03-05 (×3): 10 mg via ORAL
  Filled 2020-03-02 (×4): qty 1

## 2020-03-02 MED ORDER — SIMVASTATIN 20 MG PO TABS
20.0000 mg | ORAL_TABLET | Freq: Every day | ORAL | Status: DC
  Administered 2020-03-02 – 2020-03-04 (×3): 20 mg via ORAL
  Filled 2020-03-02: qty 1
  Filled 2020-03-02: qty 2
  Filled 2020-03-02: qty 1

## 2020-03-02 MED ORDER — SODIUM CHLORIDE 0.9% FLUSH: 3.0000 mL | INTRAVENOUS | Status: DC | PRN

## 2020-03-02 MED ORDER — LIDOCAINE 5 % EX PTCH
1.0000 | MEDICATED_PATCH | CUTANEOUS | Status: DC
  Administered 2020-03-03 – 2020-03-05 (×3): 1 via TRANSDERMAL
  Filled 2020-03-02 (×4): qty 1

## 2020-03-02 MED ORDER — SODIUM CHLORIDE 0.9 % IV SOLN
1.0000 g | Freq: Once | INTRAVENOUS | Status: AC
  Administered 2020-03-02: 1 g via INTRAVENOUS
  Filled 2020-03-02: qty 10

## 2020-03-02 MED ORDER — DOCUSATE SODIUM 100 MG PO CAPS
100.0000 mg | ORAL_CAPSULE | Freq: Two times a day (BID) | ORAL | Status: DC
  Administered 2020-03-02 – 2020-03-03 (×2): 100 mg via ORAL
  Filled 2020-03-02 (×2): qty 1

## 2020-03-02 MED ORDER — SODIUM CHLORIDE 0.9 % IV SOLN
1.0000 g | INTRAVENOUS | Status: DC
  Administered 2020-03-03 – 2020-03-04 (×2): 1 g via INTRAVENOUS
  Filled 2020-03-02 (×2): qty 1

## 2020-03-02 MED ORDER — ENOXAPARIN SODIUM 40 MG/0.4ML ~~LOC~~ SOLN: 40.0000 mg | SUBCUTANEOUS | Status: DC

## 2020-03-02 MED ORDER — FERROUS SULFATE 325 (65 FE) MG PO TBEC: 65.0000 mg | DELAYED_RELEASE_TABLET | Freq: Every day | ORAL | Status: DC

## 2020-03-02 NOTE — ED Notes (Signed)
TLSO brace applied by staff member who brought it to room. This RN then assisted with changing bedsheets. Gave pt food tray and assisted pt to eat/drink. Pure wick reapplied.

## 2020-03-02 NOTE — ED Triage Notes (Signed)
Pt comes via [facility] from [facility] with c/o mechanical fall and back pain.  Pt states she was outside feeding birds and tripped and fell.  Pt states she hit her head. No obvious injuries noted.  EMS reports VSS. Pt arrives A&OX4.

## 2020-03-02 NOTE — ED Provider Notes (Signed)
[facility] Emergency Department Provider Note ____________________________________________   First MD Initiated Contact with Patient 03/02/20 [phone]     (approximate)  I have reviewed the triage vital signs and the nursing notes.   HISTORY  Chief Complaint Fall and Back Pain  Level 5 caveat: History of present illness limited due to possible altered mental status  HPI Linda Owens is a 86 y.o. female with Owens as noted below who presents primarily with low back pain after a fall yesterday which she states happened when she was walking.  In addition, the patient states that today she felt very lightheaded and weak for a while.  She thinks that she was having some pain around that time as well, but does not remember where the pain was.  Past Medical History:  Diagnosis Date  . Allergy   . Cataract     Patient Active Problem List   Diagnosis Date Noted  . Hyperlipidemia 07/04/2017  . Hypogammaglobulinemia (HCC) 04/13/2017  . Pneumonia 03/20/2017  . Dizziness 09/22/2015  . TIA (transient ischemic attack) 07/02/2014    Past Surgical History:  Procedure Laterality Date  . ABDOMINAL HYSTERECTOMY    . COLONOSCOPY  2003    Prior to Admission medications   Medication Sig Start Date End Date Taking? Authorizing Provider  ALPRAZolam Prudy Feeler) 0.25 MG tablet Take 0.25 mg by mouth at bedtime as needed for anxiety.    [provider]  aspirin 325 MG tablet Take 325 mg by mouth daily.    [provider]  Docusate Calcium (STOOL SOFTENER PO) Take by mouth.    [provider]  ferrous sulfate 325 (65 FE) MG EC tablet Take 65 mg by mouth daily.    [provider]  lidocaine (LIDODERM) 5 % Apply patch to the most painful area for up to 12 hours in a 24 hour period. 07/10/17   [provider]  MELATONIN PO Take by mouth.    [provider]  Misc Natural Products (OSTEO BI-FLEX JOINT SHIELD PO) Take by mouth.     [provider]  Multiple Vitamins-Minerals (MACULAR VITAMIN BENEFIT PO) Take by mouth.    [provider]  oxybutynin (DITROPAN-XL) 10 MG 24 hr tablet Take 1 tablet (10 mg total) by mouth daily. 01/19/20   MacDiarmid, Lorin Picket, MD  SF 5000 PLUS 1.1 % CREA dental cream USE BID. DO NOT RINSE AFTERWARDS 11/11/18   [provider]  simvastatin (ZOCOR) 20 MG tablet TAKE 1 TABLET DAILY 08/03/17   [provider]  tolterodine (DETROL LA) 4 MG 24 hr capsule Take 4 mg by mouth daily.    [provider]  trimethoprim (TRIMPEX) 100 MG tablet Take 1 tablet (100 mg total) by mouth daily. 01/19/20   Alfredo Martinez, MD  venlafaxine (EFFEXOR) 37.5 MG tablet Take 37.5 mg by mouth 2 (two) times daily.    [provider]    Allergies Nitrofurantoin  Family History  Problem Relation Age of Onset  . Cancer Sister   . Bladder Cancer Neg Hx   . Kidney cancer Neg Hx     Social History Social History   Tobacco Use  . Smoking status: Light Tobacco Smoker    Packs/day: 0.25    Years: 50.00    Pack years: 12.50  . Smokeless tobacco: Never Used  Substance Use Topics  . Alcohol use: No    Alcohol/week: 0.0 standard drinks  . Drug use: Never    Review of Systems Level  5 caveat: Unable to obtain review of systems due to possible altered mental status    ____________________________________________   PHYSICAL EXAM:  VITAL SIGNS: ED Triage Vitals  Enc Vitals Group     BP 03/02/20 0917 (!) 130/58     Pulse Rate 03/02/20 0917 77     Resp 03/02/20 0917 18     Temp 03/02/20 0917 98.5 F (36.9 C)     Temp Source 03/02/20 0917 Oral     SpO2 03/02/20 0917 100 %     Weight 03/02/20 0915 125 lb (56.7 kg)     Height 03/02/20 0915 5\' 4"  (1.626 m)     Head Circumference --      Peak Flow --      Pain Score 03/02/20 0914 7     Pain Loc --      Pain Edu? --      Excl. in GC? --     Constitutional: Alert and oriented x2.  Relatively well appearing  for age. Eyes: Conjunctivae are normal.  Head: Atraumatic. Nose: No congestion/rhinnorhea. Mouth/Throat: Mucous membranes are slightly dry.   Neck: Normal range of motion.  Cardiovascular: Normal rate, regular rhythm. Grossly normal heart sounds.  Good peripheral circulation. Respiratory: Normal respiratory effort.  No retractions. Lungs CTAB. Gastrointestinal: Soft and nontender. No distention.  Genitourinary: No flank tenderness. Musculoskeletal: No lower extremity edema.  Extremities warm and well perfused.  Full range of motion of bilateral hips and knees.  Mild thoracolumbar midline tenderness with no step-off or crepitus. Neurologic:  Normal speech and language.  Motor and sensory intact in all extremities. Skin:  Skin is warm and dry. No rash noted. Psychiatric: Calm and cooperative.  ____________________________________________   LABS (all labs ordered are listed, but only abnormal results are displayed)  Labs Reviewed  BASIC METABOLIC PANEL - Abnormal; Notable for the following components:      Result Value   Glucose, Bld 109 (*)    All other components within normal limits  CBC WITH DIFFERENTIAL/PLATELET - Abnormal; Notable for the following components:   WBC 10.9 (*)    RBC 2.98 (*)    Hemoglobin 7.9 (*)    HCT 25.0 (*)    Neutro Abs 9.1 (*)    All other components within normal limits  URINALYSIS, COMPLETE (UACMP) WITH MICROSCOPIC - Abnormal; Notable for the following components:   Color, Urine YELLOW (*)    APPearance HAZY (*)    Nitrite POSITIVE (*)    Leukocytes,Ua MODERATE (*)    Bacteria, UA FEW (*)    Non Squamous Epithelial PRESENT (*)    All other components within normal limits  CBC - Abnormal; Notable for the following components:   WBC 10.8 (*)    RBC 2.91 (*)    Hemoglobin 7.6 (*)    HCT 24.5 (*)    All other components within normal limits  SARS CORONAVIRUS 2 (TAT 6-24 HRS)  TROPONIN I (HIGH SENSITIVITY)  TROPONIN I (HIGH SENSITIVITY)    ____________________________________________  EKG  ED ECG REPORT I, 05/02/20, the attending physician, personally viewed and interpreted this ECG.  Date: 03/02/2020 EKG Time: 0918 Rate: 84 Rhythm: normal sinus rhythm QRS Axis: normal Intervals: Incomplete RBBB ST/T Wave abnormalities: normal Narrative Interpretation: no evidence of acute ischemia ____________________________________________  RADIOLOGY  CT head: No ICH or other acute abnormality CT cervical spine: No acute fracture CT thoracic spine: T12 compression fracture CT lumbar spine: No acute fracture  ____________________________________________   PROCEDURES  Procedure(s) performed:  No  Procedures  Critical Care performed: No ____________________________________________   INITIAL IMPRESSION / ASSESSMENT AND PLAN / ED COURSE  Pertinent labs & imaging results that were available during my care of the patient were reviewed by me and considered in my medical decision making (see chart for details).  86 year old female with Owens as noted above presents with back pain after a fall yesterday, as well as with an episode today in which she felt very lightheaded and weak and possibly had some additional back or abdominal pain.  I reviewed the past medical records in Epic.  The patient has had no recent ED visits or admissions in the last 2 years.  On exam, the patient is alert and able to answer questions.  She is overall well-appearing for age.  She does seem somewhat tangential or confused in some of her responses to questions.  I am unsure if this is due to altered mental status, underlying dementia, or possibly just hearing impairment.  Her neurologic exam is nonfocal.  She does have some thoracolumbar midline tenderness with no step-off or crepitus.  We will obtain imaging of the head and spine to evaluate acute injury.  In addition, because of the patient's complaint of near syncope and vaguely feeling  unwell I will obtain a urinalysis and a lab work-up.  ----------------------------------------- 3:34 PM on 03/02/2020 -----------------------------------------  The work-up is notable for the following: The patient has hemoglobin of 7.9 previously she has run anywhere from 8.1-11 over the last few years, so this does not appear to be a significant acute change.  Her WBC count is slightly elevated and her urine shows nitrites and significant WBCs consistent with a UTI.  Her chemistry and troponin are unremarkable.  CT shows an acute T12 compression fracture with no other acute findings.  I consulted Dr. Adriana Simas from neurosurgery about the compression fracture.  He recommends a TLSO brace if the patient is able to tolerate it.  I discussed the patient's care with her daughter over the phone and updated her on the findings of the work-up.  Overall, given the patient's complaint of near syncope and weakness, her possible mild altered mental status, and the findings on the urinalysis and low hemoglobin, I recommended that we admit her for antibiotics and trending of the CBC.  In addition, she will have a TLSO brace placed.  The daughter agrees with this plan.  I discussed the case with Dr. Joylene Igo from the hospitalist service.  _____________________________  Ursula Beath was evaluated in Emergency Department on 03/02/2020 for the symptoms described in the history of present illness. She was evaluated in the context of the global COVID-19 pandemic, which necessitated consideration that the patient might be at risk for infection with the SARS-CoV-2 virus that causes COVID-19. Institutional protocols and algorithms that pertain to the evaluation of patients at risk for COVID-19 are in a state of rapid change based on information released by regulatory bodies including the CDC and federal and state organizations. These policies and algorithms were followed during the patient's care in the  ED.  ____________________________________________   FINAL CLINICAL IMPRESSION(S) / ED DIAGNOSES  Final diagnoses:  Urinary tract infection without hematuria, site unspecified  Near syncope  Closed fracture of twelfth thoracic vertebra, unspecified fracture morphology, initial encounter (HCC)      NEW MEDICATIONS STARTED DURING THIS VISIT:  New Prescriptions   No medications on file     Note:  This document was prepared using Dragon voice recognition software and  may include unintentional dictation errors.   Arta Silence, MD 03/02/20 1536

## 2020-03-02 NOTE — ED Notes (Signed)
Pt offered gram crackers but she declined. Lights dimmed. Door open. Staff called this RN about the TLSO brace and stated they will stop by to put it back together. State pt only needs to wear it when out of bed. Pt given more warm blankets. Bed locked low. Rails up. Call bell within reach.

## 2020-03-02 NOTE — ED Notes (Addendum)
Upon entrance to room, pt laying in bed with eyes closed. Chest rise and fall visible. Bed locked low. Rails up. Call bell within reach. Inquiring from EDP Siadecki whether he wants collection of 2nd trop or not.

## 2020-03-02 NOTE — ED Notes (Signed)
This RN to bedside as monitor suddenly blank as if pt had removed all vital sign equipment. Upon entrance to room, pt found sitting up at the end of her bed having dismantled TLSO brace and pulled off cardiac monitor/BP cuff/pulse ox. Pt stated she was trying to go to the bathroom. Pt had purewick in place. Denied needing to have BM. Pt did not use call bell which was attached to bed rail. Both rails were up. Bed was locked low. Educated pt again on use of call bell and reminded pt where it is located on bedrail. Having secretary call specialized staff to apply TLSO brace again. Pt assisted back into bed. Bed pad changed. Asked if pt needed something for pain. Pt declined. Bed remains locked low. Rails remain up. Call bell remains within reach. Door open.

## 2020-03-02 NOTE — ED Notes (Signed)
Secretary states staff PT/OT should be by soon to drop off/apply TLSO brace.

## 2020-03-02 NOTE — ED Notes (Addendum)
Pt gave verbal consent to call daughter Kendal Hymen and give update. Called Daughter and gave updated plan of care. Daughter states to please call her when pt is to be discharged and she will call her ride for her. Daughter also states pt had fall years ago and did well with Lidocaine patches. Will let MD know.

## 2020-03-02 NOTE — ED Notes (Signed)
Introduced self to pt. Pt alert and laying calmly in bed. Pt denies any needs currently. Bed locked low. Rails up. Call bell within reach. Educated pt to use call bell for any needs.

## 2020-03-02 NOTE — ED Notes (Signed)
Pt's daughter called wanting update. Told EDP Siadecki stated he will call back soon.

## 2020-03-02 NOTE — H&P (Signed)
History and Physical    Linda Owens:[id number] DOB: [birth year] DOA: 03/02/2020  PCP: Jerl Mina, MD   Patient coming from: Independent Living  I have personally briefly reviewed patient's old medical records in [facility]  Chief Complaint: Fall  HPI: Linda Owens is a 86 y.o. female with medical history significant for hypogammaglobulinemia, dyslipidemia and TIA who was brought to the emergency room for evaluation of low back pain following a fall yesterday.  Patient states that she tripped and fell 1 day prior to her admission while she was outside feeding the birds.  She hit her head but denied having any loss of consciousness.  She subsequently developed worsening low back pain.  On the day of admission she complained of feeling very lightheaded and weak, this has been ongoing for a  couple of weeks but worse today. Her labs revealed hemoglobin of 7.6g/dl compared to 11 over a year ago.  She has pyuria and CT scan of the thoracic spine showed superior endplate compression fracture at T12 with 20% loss of height. Attempted to do a review of systems but patient was in so much pain and did not cooperate with the exam  ED Course: 86 year old female who resides in an independent living facility was seen in the emergency room for evaluation of low back pain following a fall.  CT scan of the thoracic spine showed superior endplate compression fracture at T12 with 20% loss of height.  ED provider discussed with neuro surgery who recommended a TLSO brace and outpatient follow-up.  Patient to be admitted to the hospital for further evaluation  Review of Systems: As per HPI otherwise 10 point review of systems negative.    Past Medical History:  Diagnosis Date  . Allergy   . Cataract     Past Surgical History:  Procedure Laterality Date  . ABDOMINAL HYSTERECTOMY    . COLONOSCOPY  2003     reports that she has been smoking. She has a 12.50 pack-year smoking history. She has never  used smokeless tobacco. She reports that she does not drink alcohol or use drugs.  Allergies  Allergen Reactions  . Nitrofurantoin Rash    Family History  Problem Relation Age of Onset  . Cancer Sister   . Bladder Cancer Neg Hx   . Kidney cancer Neg Hx      Prior to Admission medications   Medication Sig Start Date End Date Taking? Authorizing Provider  ALPRAZolam Prudy Feeler) 0.25 MG tablet Take 0.25 mg by mouth at bedtime as needed for anxiety.    [provider]  aspirin 325 MG tablet Take 325 mg by mouth daily.    [provider]  Docusate Calcium (STOOL SOFTENER PO) Take by mouth.    [provider]  ferrous sulfate 325 (65 FE) MG EC tablet Take 65 mg by mouth daily.    [provider]  lidocaine (LIDODERM) 5 % Apply patch to the most painful area for up to 12 hours in a 24 hour period. 07/10/17   [provider]  MELATONIN PO Take by mouth.    [provider]  Misc Natural Products (OSTEO BI-FLEX JOINT SHIELD PO) Take by mouth.    [provider]  Multiple Vitamins-Minerals (MACULAR VITAMIN BENEFIT PO) Take by mouth.    [provider]  oxybutynin (DITROPAN-XL) 10 MG 24 hr tablet Take 1 tablet (10 mg total) by mouth daily. 01/19/20   Alfredo Martinez, MD  SF 5000 PLUS 1.1 %  CREA dental cream USE BID. DO NOT RINSE AFTERWARDS 11/11/18   [provider]  simvastatin (ZOCOR) 20 MG tablet TAKE 1 TABLET DAILY 08/03/17   [provider]  tolterodine (DETROL LA) 4 MG 24 hr capsule Take 4 mg by mouth daily.    [provider]  trimethoprim (TRIMPEX) 100 MG tablet Take 1 tablet (100 mg total) by mouth daily. 01/19/20   Alfredo MartinezMacDiarmid, Scott, MD  venlafaxine (EFFEXOR) 37.5 MG tablet Take 37.5 mg by mouth 2 (two) times daily.    [provider]    Physical Exam: Vitals:   03/02/20 1545 03/02/20 1600 03/02/20 1615 03/02/20 1630  BP:  (!) 135/54  (!) 132/54  Pulse:      Resp: 18  18 17     Temp:      TempSrc:      SpO2:      Weight:      Height:         Vitals:   03/02/20 1545 03/02/20 1600 03/02/20 1615 03/02/20 1630  BP:  (!) 135/54  (!) 132/54  Pulse:      Resp: 18  18 17   Temp:      TempSrc:      SpO2:      Weight:      Height:        Constitutional: NAD, alert and oriented to person, place and time.  Appears uncomfortable and in pain. ALSO brace in place Eyes: PERRL, lids and conjunctivae normal ENMT: Mucous membranes are moist.  Neck: normal, supple, no masses, no thyromegaly Respiratory: clear to auscultation bilaterally, no wheezing, no crackles. Normal respiratory effort. No accessory muscle use.  Cardiovascular: Regular rate and rhythm, no murmurs / rubs / gallops. No extremity edema. 2+ pedal pulses. No carotid bruits.  Abdomen: no tenderness, no masses palpated. No hepatosplenomegaly. Bowel sounds positive.  Musculoskeletal: no clubbing / cyanosis. No joint deformity upper and lower extremities.  Skin: no rashes, lesions, ulcers.  Neurologic: No gross focal neurologic deficit. Moves all extremities Psychiatric: Normal mood and affect.   Labs on Admission: I have personally reviewed following labs and imaging studies  CBC: Recent Labs  Lab 03/02/20 1048 03/02/20 1443  WBC 10.9* 10.8*  NEUTROABS 9.1*  --   HGB 7.9* 7.6*  HCT 25.0* 24.5*  MCV 83.9 84.2  PLT 184 187   Basic Metabolic Panel: Recent Labs  Lab 03/02/20 1048  NA 135  K 4.1  CL 101  CO2 24  GLUCOSE 109*  BUN 16  CREATININE 0.82  CALCIUM 8.9   GFR: Estimated Creatinine Clearance: 37.7 mL/min (by C-G formula based on SCr of 0.82 mg/dL). Liver Function Tests: No results for input(s): AST, ALT, ALKPHOS, BILITOT, PROT, ALBUMIN in the last 168 hours. No results for input(s): LIPASE, AMYLASE in the last 168 hours. No results for input(s): AMMONIA in the last 168 hours. Coagulation Profile: No results for input(s): INR, PROTIME in the last 168 hours. Cardiac Enzymes: No  results for input(s): CKTOTAL, CKMB, CKMBINDEX, TROPONINI in the last 168 hours. BNP (last 3 results) No results for input(s): PROBNP in the last 8760 hours. HbA1C: No results for input(s): HGBA1C in the last 72 hours. CBG: No results for input(s): GLUCAP in the last 168 hours. Lipid Profile: No results for input(s): CHOL, HDL, LDLCALC, TRIG, CHOLHDL, LDLDIRECT in the last 72 hours. Thyroid Function Tests: No results for input(s): TSH, T4TOTAL, FREET4, T3FREE, THYROIDAB in the last 72 hours. Anemia Panel: No results for input(s): VITAMINB12, FOLATE,  FERRITIN, TIBC, IRON, RETICCTPCT in the last 72 hours. Urine analysis:    Component Value Date/Time   COLORURINE YELLOW (A) 03/02/2020 1048   APPEARANCEUR HAZY (A) 03/02/2020 1048   APPEARANCEUR Clear 05/27/2018 1310   LABSPEC 1.012 03/02/2020 1048   PHURINE 7.0 03/02/2020 1048   GLUCOSEU NEGATIVE 03/02/2020 1048   HGBUR NEGATIVE 03/02/2020 1048   BILIRUBINUR NEGATIVE 03/02/2020 1048   BILIRUBINUR Negative 05/27/2018 1310   KETONESUR NEGATIVE 03/02/2020 1048   PROTEINUR NEGATIVE 03/02/2020 1048   NITRITE POSITIVE (A) 03/02/2020 1048   LEUKOCYTESUR MODERATE (A) 03/02/2020 1048    Radiological Exams on Admission: CT Head Wo Contrast  Result Date: 03/02/2020 CLINICAL DATA:  [place], back pain, hit head EXAM: CT HEAD WITHOUT CONTRAST TECHNIQUE: Contiguous axial images were obtained from the base of the skull through the vertex without intravenous contrast. COMPARISON:  None. FINDINGS: Brain: No evidence of acute infarct or hemorrhage. Confluent hypodensities throughout the periventricular and subcortical white matter consistent with chronic small vessel ischemic changes. The lateral ventricles and midline structures are unremarkable. No acute extra-axial fluid collections. No mass effect. Vascular: No hyperdense vessel or unexpected calcification. Skull: Normal. Negative for fracture or focal lesion. Sinuses/Orbits: No acute finding. Other: None  IMPRESSION: 1. Chronic small-vessel ischemic changes throughout the white matter. No acute intracranial trauma. Electronically Signed   By: Randa Ngo M.D.   On: 03/02/2020 11:14   CT Cervical Spine Wo Contrast  Result Date: 03/02/2020 CLINICAL DATA:  [place], back pain EXAM: CT CERVICAL SPINE WITHOUT CONTRAST TECHNIQUE: Multidetector CT imaging of the cervical spine was performed without intravenous contrast. Multiplanar CT image reconstructions were also generated. COMPARISON:  None. FINDINGS: Alignment: Cervical vertebral bodies are in grossly anatomic alignment. Skull base and vertebrae: No acute displaced fractures. Soft tissues and spinal canal: No prevertebral fluid or swelling. No visible canal hematoma. Disc levels: There is extensive multilevel spondylosis and facet hypertrophy. There is bony fusion across the facet joints from C2 through C4 bilateral and at C7/T1 bilaterally. Prominent hypertrophic changes are seen at the craniocervical junction and C1/C2 interface. Upper chest: Airway is patent.  Lung apices are clear. Other: Reconstructed images demonstrate no additional findings. IMPRESSION: 1. No acute cervical spine fracture. 2. Extensive multilevel cervical spondylosis and facet hypertrophy. Electronically Signed   By: Randa Ngo M.D.   On: 03/02/2020 11:17   CT Thoracic Spine Wo Contrast  Result Date: 03/02/2020 CLINICAL DATA:  Fall from standing position. Back pain. EXAM: CT THORACIC SPINE WITHOUT CONTRAST TECHNIQUE: Multidetector CT images of the thoracic were obtained using the standard protocol without intravenous contrast. COMPARISON:  Lumbar spine CT of the same day. FINDINGS: Alignment: No significant listhesis is present. Vertebrae: Superior endplate fracture at I77 demonstrates 20% loss of height. This is better seen on the lumbar spine CT. No additional fractures are present. Paraspinal and other soft tissues: Extensive atherosclerotic changes are noted in the aorta branch  vessels. Coronary artery calcifications are present. Mild dependent atelectasis is present in the lungs bilaterally. Disc levels: No significant stenosis is present. IMPRESSION: 1. Superior endplate compression fracture at T12 with 20% loss of height. 2. No additional fractures. 3. Aortic Atherosclerosis (ICD10-I70.0). Electronically Signed   By: San Morelle M.D.   On: 03/02/2020 11:40   CT Lumbar Spine Wo Contrast  Result Date: 03/02/2020 CLINICAL DATA:  Mechanical fall. Back pain. Fall from standing position. EXAM: CT LUMBAR SPINE WITHOUT CONTRAST TECHNIQUE: Multidetector CT imaging of the lumbar spine was performed without intravenous contrast administration. Multiplanar  CT image reconstructions were also generated. COMPARISON:  None. FINDINGS: Segmentation: 5 non rib-bearing lumbar type vertebral bodies are present. The lowest fully formed vertebral body is L5. Alignment: Grade 1 anterolisthesis at L5-S1 measures 4 mm, associated with bilateral pars defects. No other significant listhesis is present. Rightward curvature is centered at L3. Vertebrae: Prominent superior endplate Schmorl's nodes are present at L2 and L3. A remote superior endplate fracture present at L1 with 70% loss of height anteriorly and no significant retropulsed bone. No compressed bone or paraspinous hematoma is present to suggest acute fracture. A superior endplate fracture at T12 does appear acute. 20% loss of height is noted centrally. No retropulsed bone is present. Paraspinal and other soft tissues: Atherosclerotic changes are noted in the aorta and branch vessels. No aneurysm is present. No solid organ lesion is present. No significant retroperitoneal adenopathy is present. Disc levels: T11-12: Moderate facet hypertrophy is noted bilaterally. No significant stenosis is present. T12-L1: Bilateral facet hypertrophy is worse right than left. Endplate spurring contributes to mild right foraminal narrowing is well. L1-2: A  broad-based disc protrusion and bilateral facet hypertrophy results in mild left subarticular and left greater than right foraminal stenosis. L2-3: Asymmetric left-sided facet hypertrophy is present. No significant stenosis is present. L3-4: A broad-based disc protrusion is present. Moderate facet hypertrophy is worse left than right. Mild left subarticular narrowing is present. Moderate foraminal stenosis is worse on the left. L4-5: A broad-based disc protrusion is uncovered scratched at a broad-based disc protrusion is present. Moderate facet hypertrophy is noted bilaterally. Moderate central and bilateral foraminal stenosis is worse on the left. L5-S1: There is slight uncovering of a broad-based disc protrusion. Moderate facet hypertrophy is noted bilaterally. No significant stenosis is present. IMPRESSION: 1. Acute superior endplate fracture with proximally 20% loss of height at T12. No retropulsed bone. 2. The more severe L1 fracture appears remote. 3. Prominent remote superior endplate Schmorl's nodes at L2 and L3. These may be posttraumatic. 4. Multilevel degenerative changes as described. 5. Moderate central canal stenosis is most severe at L4-5. Central canal narrowing is also present at L2-3 and L3-4. Electronically Signed   By: Marin Roberts M.D.   On: 03/02/2020 11:25    EKG: Independently reviewed.  Sinus arrhythmia Non specific T wave changes  Assessment/Plan Principal Problem:   Fall at home, initial encounter Active Problems:   Anemia in other chronic diseases classified elsewhere   Compression fracture of T2 vertebra (HCC)   Acute lower UTI   Fall   Depression    Status post fall Patient presents to the emergency room for evaluation after mechanical fall at home with complaints of low back pain. CT scan of the thoracic spine shows an acute superior endplate fracture with approximately 20% loss of height at T12 Neurosurgery was consulted and have recommended a TLSO  brace Patient to follow-up with neurosurgery as an outpatient Place patient on fall precautions PT evaluation   Acute on chronic low back pain Patient has chronic low back pain secondary to spinal stenosis involving L4 - L5 with acute worsening following the fall with new T12 compression fracture Patient had a CT scan which showed multilevel degenerative changes, moderate canal stenosis most severe at L4 and L5 Pain control, TLSO brace Physical therapy evaluation   Anemia Symptomatic With complaints of dizziness and lightheadedness Hemoglobin on admission was 7.9g/dl compared to baseline of 11.5g/dl in 5284 Will obtain iron studies Obtain Hemoccult Will type and crossmatch and will transfuse for hemoglobin of  7g/dl Hold aspirin    Urinary tract infection Patient with significant pyuria Place patient on Rocephin 1 g IV daily Will up results of urine culture   Depression Continue Effexor   DVT prophylaxis: SCD Code Status: Full Family Communication: Plan of care was discussed with patient in detail and she verbalizes understanding and agrees with the plan Disposition Plan: Back to previous home environment Consults called: Neurosurgery    Terik Haughey MD [facility]     03/02/2020, 5:31 PM

## 2020-03-02 NOTE — ED Notes (Signed)
EDP Siadecki/Jessup gave verbal okay for pt to eat/drink once brace applied. Pt given update. Pt agreeable to this.

## 2020-03-02 NOTE — ED Notes (Signed)
Staff at bedside reassembling pt's brace. Pt laying calmly in bed.

## 2020-03-02 NOTE — ED Notes (Signed)
Pt taken to CT.

## 2020-03-02 NOTE — ED Notes (Signed)
This RN to bedside as pt pulling at her heart monitor again. Bed pad soaked with urine. Changed. Pt requested this RN help her put her underwear back on. Assisted. Family called and told this RN pt gets agitated when she doesn't smoke as she is a daily smoker. Pt confirmed. Pt requested nicotine patch; provider Agbata notified.

## 2020-03-02 NOTE — ED Notes (Signed)
Pt pulled off her cardiac leads, BP cuff, and pulse ox again. This RN to bedside to reapply and educate. Pt denies needs. Bed locked low. Rails up. Call bell within reach. Lights dimmed. Door open.

## 2020-03-02 NOTE — ED Notes (Signed)
Pt assisted to use bedside toilet as she was adamant she needed to use it to urinate post further education about purewick. Pt urinated. Repositioned in bed. Bed locked low. Rails up. Call bell within reach. Door open. Nonslip socks on pt's feet.

## 2020-03-02 NOTE — Progress Notes (Signed)
Orthopedic Tech Progress Note Patient Details:  TERRIN IMPARATO 1924-08-01 [id number] Received a call regarding patient back brace. RN said patient has brace but removed brace and they was not sure how to apply it back. So I called [facility] to go back out and do education and reapply brace. Patient ID: GLORA HULGAN, female   DOB: [birth year], 86 y.o.   MRN: [id number]   Donald Pore 03/02/2020, 7:16 PM

## 2020-03-02 NOTE — ED Notes (Signed)
Verbal from EDP Siadecki to go ahead and complete 2nd trop and 4hr CBC. Will collect now.

## 2020-03-03 ENCOUNTER — Encounter: Payer: Self-pay | Admitting: Internal Medicine

## 2020-03-03 LAB — CBC
HCT: 25 % — ABNORMAL LOW (ref 36.0–46.0)
Hemoglobin: 7.8 g/dL — ABNORMAL LOW (ref 12.0–15.0)
MCH: 26 pg (ref 26.0–34.0)
MCHC: 31.2 g/dL (ref 30.0–36.0)
MCV: 83.3 fL (ref 80.0–100.0)
Platelets: 174 10*3/uL (ref 150–400)
RBC: 3 MIL/uL — ABNORMAL LOW (ref 3.87–5.11)
RDW: 15.1 % (ref 11.5–15.5)
WBC: 10.3 10*3/uL (ref 4.0–10.5)
nRBC: 0 % (ref 0.0–0.2)

## 2020-03-03 LAB — BASIC METABOLIC PANEL
Anion gap: 9 (ref 5–15)
BUN: 16 mg/dL (ref 8–23)
CO2: 25 mmol/L (ref 22–32)
Calcium: 8.7 mg/dL — ABNORMAL LOW (ref 8.9–10.3)
Chloride: 102 mmol/L (ref 98–111)
Creatinine, Ser: 0.81 mg/dL (ref 0.44–1.00)
GFR calc Af Amer: >60 mL/min (ref >60)
GFR calc non Af Amer: >60 mL/min (ref >60)
Glucose, Bld: 106 mg/dL — ABNORMAL HIGH (ref 70–99)
Potassium: 4 mmol/L (ref 3.5–5.1)
Sodium: 136 mmol/L (ref 135–145)

## 2020-03-03 LAB — MRSA PCR SCREENING: MRSA by PCR: NEGATIVE

## 2020-03-03 LAB — SARS CORONAVIRUS 2 (TAT 6-24 HRS): SARS Coronavirus 2: NEGATIVE

## 2020-03-03 MED ORDER — SODIUM CHLORIDE 0.9 % IV SOLN
510.0000 mg | Freq: Once | INTRAVENOUS | Status: AC
  Administered 2020-03-03: 510 mg via INTRAVENOUS
  Filled 2020-03-03: qty 17

## 2020-03-03 MED ORDER — CALCIUM CARBONATE ANTACID 500 MG PO CHEW
500.0000 mg | CHEWABLE_TABLET | Freq: Two times a day (BID) | ORAL | Status: DC
  Administered 2020-03-03 – 2020-03-05 (×4): 500 mg via ORAL
  Filled 2020-03-03 (×4): qty 3

## 2020-03-03 MED ORDER — VENLAFAXINE HCL ER 37.5 MG PO CP24
37.5000 mg | ORAL_CAPSULE | Freq: Every day | ORAL | Status: DC
  Administered 2020-03-03 – 2020-03-05 (×3): 37.5 mg via ORAL
  Filled 2020-03-03 (×4): qty 1

## 2020-03-03 MED ORDER — NICOTINE 7 MG/24HR TD PT24
7.0000 mg | MEDICATED_PATCH | Freq: Every day | TRANSDERMAL | Status: DC
  Administered 2020-03-03 – 2020-03-05 (×3): 7 mg via TRANSDERMAL
  Filled 2020-03-03 (×4): qty 1

## 2020-03-03 MED ORDER — SENNOSIDES-DOCUSATE SODIUM 8.6-50 MG PO TABS
1.0000 | ORAL_TABLET | Freq: Two times a day (BID) | ORAL | Status: DC
  Administered 2020-03-03 – 2020-03-05 (×4): 1 via ORAL
  Filled 2020-03-03 (×4): qty 1

## 2020-03-03 MED ORDER — MORPHINE SULFATE (PF) 2 MG/ML IV SOLN: 2.0000 mg | INTRAVENOUS | Status: DC | PRN

## 2020-03-03 NOTE — Progress Notes (Signed)
Notified by pt dtr that pt is a current smoker and may need a nicotine patch during her admission. Verified with pt, 1/2 ppd. Nicotine patch order received and administered.

## 2020-03-03 NOTE — Progress Notes (Signed)
Pt received from ED, Pt alert, oriented to self only, call placed to [facility] [phone] and [phone]; as per facility, no record of patient, unable to complete admission assessment at this time. Will defer to next shift for completion when family member available.

## 2020-03-03 NOTE — TOC Initial Note (Signed)
Transition of Care [facility]) - Initial/Assessment Note    Patient Details  Name: Linda Owens MRN: [id number] Date of Birth: November 06, 1924  [facility] CM/SW Contact:    Barrie Dunker, RN Phone Number: 03/03/2020, 9:26 AM  Clinical Narrative:                 Spoke to the daughter Kendal Hymen on the phone The patient lives in independent live[facility] [place] She uses a cane at baseline inside and a rolator when leaving the home She will use [facility] if needed Her daughter stated that the mask does interfere with the hearing aid, the patient is extremely hard of hearing Her son came from out of town and will be staying with her for a while She stated that the patient at baseline sleeps 2-4 hours during the day and 12-13 hours at night Will continue to monitor for needs  Expected Discharge Plan: Home w Home Health Services Barriers to Discharge: Continued Medical Work up   Patient Goals and CMS Choice Patient states their goals for this hospitalization and ongoing recovery are:: go back to her home      Expected Discharge Plan and Services Expected Discharge Plan: Home w Home Health Services   Discharge Planning Services: CM Consult   Living arrangements for the past 2 months: Independent Living Facility                 DME Arranged: N/A         HH Arranged: [facility] has their own PT services)          Prior Living Arrangements/Services Living arrangements for the past 2 months: Independent Living Facility Lives with:: Self(SOn will be staying with her for a while) Patient language and need for interpreter reviewed:: Yes Do you feel safe going back to the place where you live?: Yes      Need for Family Participation in Patient Care: No (Comment) Care giver support system in place?: Yes (comment) Current home services: DME(cane and rolator) Criminal Activity/Legal Involvement Pertinent to Current Situation/Hospitalization: No - Comment as  needed  Activities of Daily Living   ADL Screening (condition at time of admission) Is the patient deaf or have difficulty hearing?: Yes Does the patient have difficulty seeing, even when wearing glasses/contacts?: Yes Does the patient have difficulty concentrating, remembering, or making decisions?: Yes Does the patient have difficulty dressing or bathing?: Yes Does the patient have difficulty walking or climbing stairs?: Yes  Permission Sought/Granted   Permission granted to share information with : Yes, Verbal Permission Granted              Emotional Assessment Appearance:: Appears stated age Attitude/Demeanor/Rapport: Unable to Assess(sleeping) Affect (typically observed): Appropriate Orientation: : Oriented to Self Alcohol / Substance Use: Not Applicable Psych Involvement: No (comment)  Admission diagnosis:  Fall [W19.XXXA] Near syncope [R55] Urinary tract infection without hematuria, site unspecified [N39.0] Closed fracture of twelfth thoracic vertebra, unspecified fracture morphology, initial encounter [facility]) [S22.089A] Patient Active Problem List   Diagnosis Date Noted  . Anemia in other chronic diseases classified elsewhere 03/02/2020  . Fall at home, initial encounter 03/02/2020  . Compression fracture of T2 vertebra (HCC) 03/02/2020  . Acute lower UTI 03/02/2020  . Fall 03/02/2020  . Depression 03/02/2020  . Hyperlipidemia 07/04/2017  . Hypogammaglobulinemia (HCC) 04/13/2017  . Pneumonia 03/20/2017  . Dizziness 09/22/2015  . TIA (transient ischemic attack) 07/02/2014   PCP:  Jerl Mina, MD Pharmacy:  [place], [place] - [address] [place] [address] Phone: [phone] Fax: [phone]  [id number] - [place], [place] - [address] [place] [address] Phone: [phone] Fax: [phone]  [facility] - [place], [address] [place] [address] Phone: [phone] Fax: [place], [place] [address] [place] [address] Phone: [phone] Fax: [phone]  [facility] [place], [place] [address] [place] [address] Phone: [phone] Fax: [phone]     Social Determinants of Health (SDOH) Interventions    Readmission Risk Interventions No flowsheet data found.

## 2020-03-03 NOTE — Evaluation (Signed)
Physical Therapy Evaluation Patient Details Name: Linda Owens MRN: [id number] DOB: 1924/02/19 Today's Date: 03/03/2020   History of Present Illness  Pt is a 86 y.o. female with medical history significant for hypogammaglobulinemia, dyslipidemia, current smoker and TIA who was brought to the emergency room for evaluation of low back pain following a fall yesterday. Work up showed superior endplate compression fracture at T12 with 20% loss of height.  Neuro surgery who recommended a TLSO brace and outpatient follow-up.    Clinical Impression  Pt awake, family at bedside. Pt HOH, PLOF gathered from pt and from family. Oriented to place, situation, self. Per family pt lives at independent living; modI for ambulation of [facility] in home, rollator for community ambulation, modI for ADLs, and pt cooks, cleans. Family knows of at least 4 falls in the last year.  The patient demonstrated rolling in bed with supervision, sidelying to sit with modA due to pain. Once in sitting TLSO donned, with PT reviewing for pt/family education, at end of session educated on doffing of TLSO. Sit <> stand with RW and minA pt endorsed pain but able to complete slowly. The patient ambulated ~30ft with RW and CGA. Pt exhibited decreased step length/height bilaterally, slow shuffled step. Pt endorsed fatigue quickly while walking, and intermittent assistance for RW management.  Overall the patient demonstrated deficits (see "PT Problem List") that impede the patient's functional abilities, safety, and mobility and would benefit from skilled PT intervention. Recommendation is SNF due to current level of assistance needed for mobility and TLSO brace pending pt progress.     Follow Up Recommendations SNF    Equipment Recommendations  Rolling walker with 5" wheels    Recommendations for Other Services       Precautions / Restrictions Precautions Precautions: Fall Precaution Comments: T12 compression fx Required Braces or  Orthoses: (TLSO for OOB; able to dress/bathe/lay in bed without brace) Restrictions Weight Bearing Restrictions: No      Mobility  Bed Mobility Overal bed mobility: Needs Assistance Bed Mobility: Rolling;Sidelying to Sit Rolling: Min guard Sidelying to sit: Mod assist;HOB elevated       General bed mobility comments: pt endorsed pain with transfer  Transfers Overall transfer level: Needs assistance Equipment used: Rolling walker (2 wheeled) Transfers: Sit to/from Stand Sit to Stand: Min assist            Ambulation/Gait Ambulation/Gait assistance: Min guard;Min assist Gait Distance (Feet): 20 Feet Assistive device: Rolling walker (2 wheeled)       General Gait Details: Pt exhibited decreased step length/height bilaterally, slow shuffled step. Endorsed fatigue quickly, and intermittent assistance for RW management.  Stairs            Wheelchair Mobility    Modified Rankin (Stroke Patients Only)       Balance Overall balance assessment: Needs assistance Sitting-balance support: Feet supported Sitting balance-Leahy Scale: Fair       Standing balance-Leahy Scale: Poor Standing balance comment: reliant on RW                             Pertinent Vitals/Pain Pain Assessment: 0-10 Pain Score: 2  Pain Location: "slight pain in back" Pain Descriptors / Indicators: Grimacing;Moaning Pain Intervention(s): Limited activity within patient's tolerance;Monitored during session;Repositioned    Home Living Family/patient expects to be discharged to:: Private residence Living Arrangements: Alone Available Help at Discharge: Available PRN/intermittently;Family Type of Home: Independent living facility Home Access: Level entry  Home Layout: One level Home Equipment: Grab bars - toilet;Grab bars - tub/shower;Walker - 4 wheels;Cane - single point      Prior Function Level of Independence: Independent with assistive device(s)                Hand Dominance        Extremity/Trunk Assessment   Upper Extremity Assessment Upper Extremity Assessment: Generalized weakness    Lower Extremity Assessment Lower Extremity Assessment: Generalized weakness(pt able to lift BLE against gravity, knee flexion AROM, ankle DF/PF)    Cervical / Trunk Assessment Cervical / Trunk Assessment: Kyphotic  Communication   Communication: HOH  Cognition Arousal/Alertness: Awake/alert Behavior During Therapy: WFL for tasks assessed/performed Overall Cognitive Status: Within Functional Limits for tasks assessed                                        General Comments      Exercises Other Exercises Other Exercises: donning of TLSO performed with pt and family education. Explanation of doffing as well.   Assessment/Plan    PT Assessment Patient needs continued PT services  PT Problem List Decreased strength;Decreased mobility;Decreased range of motion;Decreased activity tolerance;Decreased balance;Decreased knowledge of use of DME;Decreased knowledge of precautions       PT Treatment Interventions DME instruction;Therapeutic exercise;Gait training;Balance training;Neuromuscular re-education;Functional mobility training;Therapeutic activities;Patient/family education    PT Goals (Current goals can be found in the Care Plan section)  Acute Rehab PT Goals Patient Stated Goal: to decrease pain PT Goal Formulation: With patient/family Time For Goal Achievement: 03/17/20 Potential to Achieve Goals: Good    Frequency 7X/week   Barriers to discharge Decreased caregiver support      Co-evaluation               AM-PAC PT "6 Clicks" Mobility  Outcome Measure Help needed turning from your back to your side while in a flat bed without using bedrails?: A Lot Help needed moving from lying on your back to sitting on the side of a flat bed without using bedrails?: A Lot Help needed moving to and from a bed to a chair  (including a wheelchair)?: A Lot Help needed standing up from a chair using your arms (e.g., wheelchair or bedside chair)?: A Little Help needed to walk in hospital room?: A Little Help needed climbing 3-5 steps with a railing? : A Lot 6 Click Score: 14    End of Session Equipment Utilized During Treatment: Gait belt Activity Tolerance: Patient limited by pain;Patient limited by fatigue Patient left: in chair;with call bell/phone within reach;with family/visitor present;with chair alarm set Nurse Communication: Mobility status PT Visit Diagnosis: Other abnormalities of gait and mobility (R26.89);Difficulty in walking, not elsewhere classified (R26.2);Pain Pain - Right/Left: (midilne) Pain - part of body: (back)    Time: 4287-6811 PT Time Calculation (min) (ACUTE ONLY): 32 min   Charges:   PT Evaluation $PT Eval Moderate Complexity: 1 Mod PT Treatments $Therapeutic Exercise: 8-22 mins $Therapeutic Activity: 8-22 mins        Olga Coaster PT, DPT 3:14 PM,03/03/20

## 2020-03-03 NOTE — ED Notes (Signed)
Floor notified of pt arrival, pt shown new call bell, bed alarm in place.

## 2020-03-03 NOTE — Progress Notes (Signed)
[facility] Progress Note  Patient: Linda Owens    AYT:[id number]  DOA: 03/02/2020     Date of Service: the patient was seen and examined on 03/03/2020  Chief Complaint  Patient presents with  . Fall  . Back Pain   Brief hospital course: TIA, hypogammaglobulinemia, hard of hearing, dyslipidemia.  Patient presented with a fall with head injury.  The fall was mechanical fall although patient did have some complaint of lightheadedness on admission.  Found to have compression fracture as well as UTI and chronic anemia. Patient is from independent living facility and lives alone.  Remains at risk for frequent fall given the evidence of multiple compression fractures at various stages of healing. Currently further plan is continue IV antibiotics and arrange for safe discharge plan.  Assessment and Plan: 1.  T12 acute compression fracture Mechanical fall Remote L1 compression fracture Acute on chronic low back pain Patient presents with a mechanical fall in her ALF. Trauma work-up was suggestive of acute superior endplate fracture with approximately 20% loss of height at T12. EDP discussed with neurosurgery and recommended outpatient follow-up in a TLSO brace with conservative measures. Patient does not have any focal deficit at the time of my evaluation. Reports pain in the back. No nausea no vomiting. PT recommends SNF.  2.  Acute on chronic low back pain. Spinal stenosis at L4-L5. Continue TLSO brace. Continue tramadol as well as add morphine for pain control.  3.  Iron deficiency anemia IV iron provided. Active bleeding.  Monitor FOBT. Patient is on chronic iron therapy. H&H relatively stable.  No evidence of active bleeding.  Monitor.  4.  UTI Overactive bladder Patient has history of recurrent UTI. She is on chronic suppressive therapy with trimethoprim. Follows up with urology and on oxybutynin. Will complete 3-day treatment course for IV antibiotics followed by  resumption of her long-term suppressive therapy.  5.  Constipation.  Nausea. We will treat constipation aggressively with Senokot and MiraLAX.  Monitor.  6.  Depression Continue Effexor  7.  Active smoker. Patient smokes half pack a day. Nicotine patch.  8.  Goals of care discussion. Had a discussion with patient's daughter regarding goals of care.  Daughter is the POA. She reported she had this conversation in the past with the patient and she and patient currently would like to remain full code but would not like to be kept alive artificially if there is no hope.  Diet: Regular diet DVT Prophylaxis: Subcutaneous Lovenox   Advance goals of care discussion: Full code  Family Communication: no family was present at bedside, at the time of interview.  Discussed with daughter on the phone, the pt provided permission to discuss medical plan with the family. Opportunity was given to ask question and all questions were answered satisfactorily.   Disposition:  Pt is from ALF, admitted with fall and compression fracture, still has uncontrolled pain with nausea, patient lives independently and at risk for multiple falls.  PT evaluation recommends SNF.  Patient unsafe to go home in her current environment.  This precludes a safe discharge. Discharge to SNF, when medically stable.  Subjective: Continues report back pain.  Has nausea but no vomiting.  No shortness of breath no chest pain.  Very hard of hearing.  Physical Exam: General:  alert oriented to time, place, and person.  Appear in mild distress, affect appropriate Eyes: PERRL ENT: Oral Mucosa Clear, moist  Neck: no JVD,  Cardiovascular: S1 and S2 Present, no Murmur,  Respiratory: good respiratory effort, Bilateral Air entry equal and Decreased, no Crackles, no wheezes Abdomen: Bowel Sound present, Soft and no tenderness,  Skin: no rash Extremities: no Pedal edema, no calf tenderness Neurologic: without any new focal findings    Gait not checked due to patient safety concerns  Vitals:   03/02/20 2309 03/03/20 0026 03/03/20 0101 03/03/20 0739  BP:  100/72 (!) 107/50 116/61  Pulse:  87 78 91  Resp:  20 16 18   Temp:   98.3 F (36.8 C) 97.6 F (36.4 C)  TempSrc:   Oral Oral  SpO2: 98% 96%  95%  Weight:      Height:        Intake/Output Summary (Last 24 hours) at 03/03/2020 1515 Last data filed at 03/03/2020 1010 Gross per 24 hour  Intake 293 ml  Output --  Net 293 ml   Filed Weights   03/02/20 0915 03/02/20 0917  Weight: 56.7 kg 57.2 kg    Data Reviewed: I have personally reviewed and interpreted daily labs, tele strips, imagings as discussed above. I reviewed all nursing notes, pharmacy notes, vitals, pertinent old records I have discussed plan of care as described above with RN and patient/family.  CBC: Recent Labs  Lab 03/02/20 1048 03/02/20 1443 03/03/20 0437  WBC 10.9* 10.8* 10.3  NEUTROABS 9.1*  --   --   HGB 7.9* 7.6* 7.8*  HCT 25.0* 24.5* 25.0*  MCV 83.9 84.2 83.3  PLT 184 187 742   Basic Metabolic Panel: Recent Labs  Lab 03/02/20 1048 03/03/20 0437  NA 135 136  K 4.1 4.0  CL 101 102  CO2 24 25  GLUCOSE 109* 106*  BUN 16 16  CREATININE 0.82 0.81  CALCIUM 8.9 8.7*    Studies: No results found.  Scheduled Meds: . docusate sodium  100 mg Oral BID  . ferrous sulfate  325 mg Oral BID WC  . lidocaine  1 patch Transdermal Q24H  . multivitamin-lutein  1 capsule Oral Daily  . oxybutynin  10 mg Oral Daily  . simvastatin  20 mg Oral Daily  . sodium chloride flush  3 mL Intravenous Q12H  . venlafaxine XR  37.5 mg Oral Q breakfast   Continuous Infusions: . sodium chloride 250 mL (03/03/20 1118)  . cefTRIAXone (ROCEPHIN)  IV 1 g (03/03/20 1428)   PRN Meds: sodium chloride, ALPRAZolam, morphine injection, ondansetron **OR** ondansetron (ZOFRAN) IV, sodium chloride flush, traMADol  Time spent: 35 minutes  Author: Berle Mull, MD [facility] 03/03/2020 3:15  PM  To reach On-call, see care teams to locate the attending and reach out to them via [email or web address]. If 7PM-7AM, please contact night-coverage If you still have difficulty reaching the attending provider, please page the [facility] (Director on Call) for [facility] on amion for assistance.

## 2020-03-03 NOTE — ED Notes (Addendum)
PT's back brace, instructions, clothes, cane and purse sent with her to floor

## 2020-03-03 NOTE — NC FL2 (Signed)
[place] LEVEL OF CARE SCREENING TOOL     IDENTIFICATION  Patient Name: Linda Owens Birthdate: 12/08/1924 Sex: female Admission Date (Current Location): 03/02/2020  [place] and [place] Number:  Engineering geologist and Address:  [facility], [address], [place], [place] [address]      Provider Number: [id number]  Attending Physician Name and Address:  Lavina Hamman, MD  Relative Name and Phone Number:  Horris Latino [phone]    Current Level of Care: Hospital Recommended Level of Care: [place] Prior Approval Number:    Date Approved/Denied:   PASRR Number: [id number]  Discharge Plan: SNF    Current Diagnoses: Patient Active Problem List   Diagnosis Date Noted  . Anemia in other chronic diseases classified elsewhere 03/02/2020  . Fall at home, initial encounter 03/02/2020  . Compression fracture of T2 vertebra ([place]) 03/02/2020  . Acute lower UTI 03/02/2020  . Fall 03/02/2020  . Depression 03/02/2020  . Hyperlipidemia 07/04/2017  . Hypogammaglobulinemia ([place]) 04/13/2017  . Pneumonia 03/20/2017  . Dizziness 09/22/2015  . TIA (transient ischemic attack) 07/02/2014    Orientation RESPIRATION BLADDER Height & Weight     Self, Time, Situation, Place    Continent Weight: 57.2 kg Height:  5\' 5"  (165.1 cm)  BEHAVIORAL SYMPTOMS/MOOD NEUROLOGICAL BOWEL NUTRITION STATUS      Continent    AMBULATORY STATUS COMMUNICATION OF NEEDS Skin   Extensive Assist Verbally                         Personal Care Assistance Level of Assistance  Dressing     Dressing Assistance: Limited assistance     Functional Limitations Info  Hearing   Hearing Info: Impaired      SPECIAL CARE FACTORS FREQUENCY  PT (By licensed PT)     PT Frequency: 5 times per week              Contractures Contractures Info: Not present    Additional Factors Info  Allergies   Allergies Info: nitrofurantoin            Current Medications (03/03/2020):  This is the current hospital active medication list Current Facility-Administered Medications  Medication Dose Route Frequency Provider Last Rate Last Admin  . 0.9 %  sodium chloride infusion  250 mL Intravenous PRN Agbata, Tochukwu, MD 10 mL/hr at 03/03/20 1118 250 mL at 03/03/20 1118  . ALPRAZolam Duanne Moron) tablet 0.25 mg  0.25 mg Oral QHS PRN Agbata, Tochukwu, MD   0.25 mg at 03/02/20 1943  . cefTRIAXone (ROCEPHIN) 1 g in sodium chloride 0.9 % 100 mL IVPB  1 g Intravenous Q24H Agbata, Tochukwu, MD 200 mL/hr at 03/03/20 1428 1 g at 03/03/20 1428  . docusate sodium (COLACE) capsule 100 mg  100 mg Oral BID Agbata, Tochukwu, MD   100 mg at 03/03/20 0850  . ferrous sulfate tablet 325 mg  325 mg Oral BID WC Agbata, Tochukwu, MD   325 mg at 03/03/20 0855  . lidocaine (LIDODERM) 5 % 1 patch  1 patch Transdermal Q24H Agbata, Tochukwu, MD   1 patch at 03/03/20 0851  . morphine 2 MG/ML injection 2 mg  2 mg Intravenous Q2H PRN Lavina Hamman, MD      . multivitamin-lutein (OCUVITE-LUTEIN) capsule 1 capsule  1 capsule Oral Daily Agbata, Tochukwu, MD   1 capsule at 03/03/20 0850  . ondansetron (ZOFRAN) tablet 4 mg  4 mg Oral Q6H  PRN Lucile Shutters, MD       Or  . ondansetron (ZOFRAN) injection 4 mg  4 mg Intravenous Q6H PRN Agbata, Tochukwu, MD      . oxybutynin (DITROPAN-XL) 24 hr tablet 10 mg  10 mg Oral Daily Agbata, Tochukwu, MD   10 mg at 03/03/20 0851  . simvastatin (ZOCOR) tablet 20 mg  20 mg Oral Daily Agbata, Tochukwu, MD   20 mg at 03/02/20 1942  . sodium chloride flush (NS) 0.9 % injection 3 mL  3 mL Intravenous Q12H Agbata, Tochukwu, MD   3 mL at 03/03/20 0852  . sodium chloride flush (NS) 0.9 % injection 3 mL  3 mL Intravenous PRN Agbata, Tochukwu, MD      . traMADol (ULTRAM) tablet 50 mg  50 mg Oral Q6H PRN Agbata, Tochukwu, MD   50 mg at 03/03/20 1547  . venlafaxine XR (EFFEXOR-XR) 24 hr capsule 37.5 mg  37.5 mg Oral Q breakfast Agbata, Tochukwu, MD    37.5 mg at 03/03/20 4496     Discharge Medications: Please see discharge summary for a list of discharge medications.  Relevant Imaging Results:  Relevant Lab Results:   Additional Information SS# [id number]  Barrie Dunker, RN

## 2020-03-03 NOTE — TOC Progression Note (Signed)
Transition of Care [facility]) - Progression Note    Patient Details  Name: Linda Owens MRN: [id number] Date of Birth: 1924-12-09  Transition of Care [facility]) CM/SW Contact  Su Hilt, RN Phone Number: 03/03/2020, 3:51 PM  Clinical Narrative:     Met with the patient and her son in the room, reviewed that PT is recommending SNF for rehab, The patient is familiar with [facility] and asked that I call her daughter Horris Latino, I called Horris Latino and reviewed the inforamtion, Horris Latino agrees that she would like to go to [facility], I called Seth Bake at [facility] and she made a bed offer, I sent the information thru the Hub to [facility].  The patient has had both Covid vacines Anticipate DC to be Friday Expected Discharge Plan: [place] Barriers to Discharge: Continued Medical Work up  Expected Discharge Plan and Services Expected Discharge Plan: [place]   Discharge Planning Services: CM Consult   Living arrangements for the past 2 months: [place]                 DME Arranged: N/A         HH Arranged: [facility] has their own PT services)           Social Determinants of Health (SDOH) Interventions    Readmission Risk Interventions No flowsheet data found.

## 2020-03-04 ENCOUNTER — Inpatient Hospital Stay: Payer: Medicare Other

## 2020-03-04 LAB — BASIC METABOLIC PANEL
Anion gap: 7 (ref 5–15)
BUN: 19 mg/dL (ref 8–23)
CO2: 26 mmol/L (ref 22–32)
Calcium: 9 mg/dL (ref 8.9–10.3)
Chloride: 101 mmol/L (ref 98–111)
Creatinine, Ser: 0.77 mg/dL (ref 0.44–1.00)
GFR calc Af Amer: >60 mL/min (ref >60)
GFR calc non Af Amer: >60 mL/min (ref >60)
Glucose, Bld: 113 mg/dL — ABNORMAL HIGH (ref 70–99)
Potassium: 4 mmol/L (ref 3.5–5.1)
Sodium: 134 mmol/L — ABNORMAL LOW (ref 135–145)

## 2020-03-04 LAB — CBC
HCT: 24.4 % — ABNORMAL LOW (ref 36.0–46.0)
Hemoglobin: 7.8 g/dL — ABNORMAL LOW (ref 12.0–15.0)
MCH: 26.4 pg (ref 26.0–34.0)
MCHC: 32 g/dL (ref 30.0–36.0)
MCV: 82.7 fL (ref 80.0–100.0)
Platelets: 163 10*3/uL (ref 150–400)
RBC: 2.95 MIL/uL — ABNORMAL LOW (ref 3.87–5.11)
RDW: 14.9 % (ref 11.5–15.5)
WBC: 8.9 10*3/uL (ref 4.0–10.5)
nRBC: 0 % (ref 0.0–0.2)

## 2020-03-04 LAB — MAGNESIUM: Magnesium: 2.1 mg/dL (ref 1.7–2.4)

## 2020-03-04 MED ORDER — ENOXAPARIN SODIUM 40 MG/0.4ML ~~LOC~~ SOLN
40.0000 mg | SUBCUTANEOUS | Status: DC
  Administered 2020-03-04: 40 mg via SUBCUTANEOUS
  Filled 2020-03-04: qty 0.4

## 2020-03-04 MED ORDER — LACTULOSE 10 GM/15ML PO SOLN
20.0000 g | Freq: Every day | ORAL | Status: DC
  Administered 2020-03-04 – 2020-03-05 (×2): 20 g via ORAL
  Filled 2020-03-04 (×2): qty 30

## 2020-03-04 MED ORDER — TRIMETHOPRIM 100 MG PO TABS
100.0000 mg | ORAL_TABLET | Freq: Every day | ORAL | Status: DC
  Administered 2020-03-04 – 2020-03-05 (×2): 100 mg via ORAL
  Filled 2020-03-04 (×2): qty 1

## 2020-03-04 NOTE — Progress Notes (Signed)
[facility] Progress Note  Patient: Linda Owens    RJJ:[id number]  DOA: 03/02/2020     Date of Service: the patient was seen and examined on 03/04/2020  Chief Complaint  Patient presents with  . Fall  . Back Pain   Brief hospital course: TIA, hypogammaglobulinemia, hard of hearing, dyslipidemia.  Patient presented with a fall with head injury.  The fall was mechanical fall although patient did have some complaint of lightheadedness on admission.  Found to have compression fracture as well as UTI and chronic anemia. Patient is from independent living facility and lives alone.  Remains at risk for frequent fall given the evidence of multiple compression fractures at various stages of healing. Currently further plan is arrange for safe discharge plan.  Assessment and Plan: 1.  T12 acute compression fracture Mechanical fall Remote L1 compression fracture Acute on chronic low back pain Patient presents with a mechanical fall in her ALF. Trauma work-up was suggestive of acute superior endplate fracture with approximately 20% loss of height at T12. EDP discussed with neurosurgery and recommended outpatient follow-up in a TLSO brace with conservative measures. Patient does not have any focal deficit at the time of my evaluation. Reports pain in the back. No nausea no vomiting. PT recommends SNF.  2.  Acute on chronic low back pain. Spinal stenosis at L4-L5. Continue TLSO brace. Continue tramadol as well as add morphine for pain control.  3.  Iron deficiency anemia IV iron provided. Active bleeding.  Monitor FOBT. Patient is on chronic iron therapy. H&H relatively stable.  No evidence of active bleeding.  Monitor.  4.  UTI Overactive bladder Patient has history of recurrent UTI. She is on chronic suppressive therapy with trimethoprim. Follows up with urology and on oxybutynin. completed 3-day treatment course for IV antibiotics followed by resumption of her long-term  suppressive therapy.  5.  Constipation.  Nausea. We will treat constipation aggressively with Senokot and MiraLAX.  Monitor.  6.  Depression Continue Effexor  7.  Active smoker. Patient smokes half pack a day. Nicotine patch.  8.  Goals of care discussion. Had a discussion with patient's daughter regarding goals of care.  Daughter is the POA. She reported she had this conversation in the past with the patient and she and patient currently would like to remain full code but would not like to be kept alive artificially if there is no hope.  Diet: Regular diet DVT Prophylaxis: Subcutaneous Lovenox   Advance goals of care discussion: Full code  Family Communication: no family was present at bedside, at the time of interview.  Discussed with daughter on the phone, the pt provided permission to discuss medical plan with the family. Opportunity was given to ask question and all questions were answered satisfactorily.   Disposition:  Pt is from ALF, admitted with fall and compression fracture, still has uncontrolled pain with nausea, patient lives independently and at risk for multiple falls.  PT evaluation recommends SNF.  Patient unsafe to go home in her current environment.  This precludes a safe discharge. Discharge to SNF, when medically stable.  Subjective: No acute complaint no fever no chills.  No chest pain abdominal pain.  Physical Exam: General:  alert oriented to time, place, and person.  Appear in mild distress, affect appropriate Eyes: PERRL ENT: Oral Mucosa Clear, moist  Neck: no JVD,  Cardiovascular: S1 and S2 Present, no Murmur,  Respiratory: good respiratory effort, Bilateral Air entry equal and Decreased, no Crackles, no wheezes Abdomen:  Bowel Sound present, Soft and no tenderness,  Skin: no rash Extremities: no Pedal edema, no calf tenderness Neurologic: without any new focal findings  Gait not checked due to patient safety concerns  Vitals:   03/03/20 1617  03/03/20 2323 03/04/20 0732 03/04/20 1611  BP: (!) 108/54 109/61 (!) 112/52 (!) 101/58  Pulse: 75 85 91 87  Resp: 18 18 18 18   Temp: 99 F (37.2 C) 99 F (37.2 C) 98.6 F (37 C) 98 F (36.7 C)  TempSrc: Oral Oral Oral Oral  SpO2: 96% 94% 92% 94%  Weight:      Height:        Intake/Output Summary (Last 24 hours) at 03/04/2020 1904 Last data filed at 03/04/2020 0500 Gross per 24 hour  Intake 30 ml  Output --  Net 30 ml   Filed Weights   03/02/20 0915 03/02/20 0917  Weight: 56.7 kg 57.2 kg    Data Reviewed: I have personally reviewed and interpreted daily labs, tele strips, imagings as discussed above. I reviewed all nursing notes, pharmacy notes, vitals, pertinent old records I have discussed plan of care as described above with RN and patient/family.  CBC: Recent Labs  Lab 03/02/20 1048 03/02/20 1443 03/03/20 0437 03/04/20 0344  WBC 10.9* 10.8* 10.3 8.9  NEUTROABS 9.1*  --   --   --   HGB 7.9* 7.6* 7.8* 7.8*  HCT 25.0* 24.5* 25.0* 24.4*  MCV 83.9 84.2 83.3 82.7  PLT 184 187 174 196   Basic Metabolic Panel: Recent Labs  Lab 03/02/20 1048 03/03/20 0437 03/04/20 0344  NA 135 136 134*  K 4.1 4.0 4.0  CL 101 102 101  CO2 24 25 26   GLUCOSE 109* 106* 113*  BUN 16 16 19   CREATININE 0.82 0.81 0.77  CALCIUM 8.9 8.7* 9.0  MG  --   --  2.1    Studies: No results found.  Scheduled Meds: . calcium carbonate  500 mg of elemental calcium Oral BID WC  . enoxaparin (LOVENOX) injection  40 mg Subcutaneous Q24H  . ferrous sulfate  325 mg Oral BID WC  . lidocaine  1 patch Transdermal Q24H  . multivitamin-lutein  1 capsule Oral Daily  . nicotine  7 mg Transdermal Daily  . oxybutynin  10 mg Oral Daily  . senna-docusate  1 tablet Oral BID  . simvastatin  20 mg Oral Daily  . sodium chloride flush  3 mL Intravenous Q12H  . venlafaxine XR  37.5 mg Oral Q breakfast   Continuous Infusions: . sodium chloride 250 mL (03/03/20 1118)   PRN Meds: sodium chloride,  ALPRAZolam, morphine injection, ondansetron **OR** ondansetron (ZOFRAN) IV, sodium chloride flush, traMADol  Time spent: 35 minutes  Author: Berle Mull, MD [facility] 03/04/2020 7:04 PM  To reach On-call, see care teams to locate the attending and reach out to them via [email or web address]. If 7PM-7AM, please contact night-coverage If you still have difficulty reaching the attending provider, please page the [facility] (Director on Call) for [facility] on amion for assistance.

## 2020-03-04 NOTE — Progress Notes (Signed)
Physical Therapy Treatment Patient Details Name: Linda Owens MRN: [id number] DOB: [birth year] Today's Date: 03/04/2020    History of Present Illness Pt is a 86 y.o. female with medical history significant for hypogammaglobulinemia, dyslipidemia, current smoker and TIA who was brought to the emergency room for evaluation of low back pain following a fall yesterday. Work up showed superior endplate compression fracture at T12 with 20% loss of height.  Neuro surgery who recommended a TLSO brace and outpatient follow-up.    PT Comments    Pt was seated in recliner upon arriving with TLSO donned. Per RN, pt was requesting to get back into bed 2/2 to pain/uncomfortable. Upon therapist entering, pt very [place] and hard to understand 2/2 to being slightly lethargic. She verbalized wanting to get back into bed. Increased time to perform all mobility 2/2 to pain. Pt was able to stand and take ~ 4 steps to EOB requiring min assist to stand and ambulate. Once seated EOB,therapist removed TLSO. She required min assist to return to supine with vcs for proper technique to adhere to spinal precautions. Once repositioned in supine with HOB slightly elevated, pt fell asleep. PT continues to recommend D/C to SNF when medically stable to address deficits and assist pt with returning to PLOF.      Follow Up Recommendations  SNF     Equipment Recommendations  Rolling walker with 5" wheels    Recommendations for Other Services       Precautions / Restrictions Precautions Precautions: Fall Precaution Comments: T12 compression fx Required Braces or Orthoses: Spinal Brace Spinal Brace: Thoracolumbosacral orthotic Restrictions Weight Bearing Restrictions: No    Mobility  Bed Mobility Overal bed mobility: Needs Assistance Bed Mobility: Rolling;Sit to Supine Rolling: Min assist     Sit to supine: Min assist   General bed mobility comments: pt required increased time and min assist to progress from short sit  to sidelying to supine. pt presents with severe pain with bed mobility but once repositioned with TLSO removed, was able to get more comfortable  Transfers Overall transfer level: Needs assistance Equipment used: Rolling walker (2 wheeled) Transfers: Sit to/from Stand Sit to Stand: Min assist         General transfer comment: Min assist to stand from recliner with increased time 2/2 to pain. Constant vcs for initiation opf movements 2/2 to pt being lethargic.   Ambulation/Gait Ambulation/Gait assistance: Min assist Gait Distance (Feet): 4 Feet Assistive device: Rolling walker (2 wheeled) Gait Pattern/deviations: Antalgic Gait velocity: decreased   General Gait Details: pt was only able to tolerate ambulation from recliner to bed 2/2 to increased time with movements. TLSO donned throughout session until pt was seated EOB.    Stairs             Wheelchair Mobility    Modified Rankin (Stroke Patients Only)       Balance                                            Cognition Arousal/Alertness: Lethargic(slightly lethargic) Behavior During Therapy: WFL for tasks assessed/performed Overall Cognitive Status: Difficult to assess                                 General Comments: Pt was seated in recliner prior to PT session. Per RN,  pt requesting to get back in bed 2/2 to pain. Pt is HOH and required alot of increased time and repeating questions to respond to therapist questions. unable to rate pain but facial expression shows increase pain this date.      Exercises      General Comments        Pertinent Vitals/Pain Pain Assessment: Faces Faces Pain Scale: Hurts whole lot Pain Descriptors / Indicators: Grimacing;Moaning;Constant;Aching Pain Intervention(s): Limited activity within patient's tolerance;Monitored during session;Repositioned    Home Living                      Prior Function            PT Goals (current  goals can now be found in the care plan section) Acute Rehab PT Goals Patient Stated Goal: to decrease pain Progress towards PT goals: Progressing toward goals    Frequency    7X/week      PT Plan Current plan remains appropriate    Co-evaluation              AM-PAC PT "6 Clicks" Mobility   Outcome Measure  Help needed turning from your back to your side while in a flat bed without using bedrails?: A Lot Help needed moving from lying on your back to sitting on the side of a flat bed without using bedrails?: A Lot Help needed moving to and from a bed to a chair (including a wheelchair)?: A Lot Help needed standing up from a chair using your arms (e.g., wheelchair or bedside chair)?: A Little Help needed to walk in hospital room?: A Little Help needed climbing 3-5 steps with a railing? : A Lot 6 Click Score: 14    End of Session Equipment Utilized During Treatment: Gait belt Activity Tolerance: Patient limited by pain Patient left: in bed;with call bell/phone within reach;with bed alarm set;with nursing/sitter in room Nurse Communication: Mobility status PT Visit Diagnosis: Other abnormalities of gait and mobility (R26.89);Difficulty in walking, not elsewhere classified (R26.2);Pain Pain - part of body: (back)     Time: 1050-1106 PT Time Calculation (min) (ACUTE ONLY): 16 min  Charges:  $Therapeutic Activity: 8-22 mins                     Jetta Lout PTA 03/04/20, 11:23 AM

## 2020-03-04 NOTE — Plan of Care (Signed)
?  Problem: Clinical Measurements: ?Goal: Ability to maintain clinical measurements within normal limits will improve ?Outcome: Progressing ?Goal: Will remain free from infection ?Outcome: Progressing ?Goal: Diagnostic test results will improve ?Outcome: Progressing ?Goal: Respiratory complications will improve ?Outcome: Progressing ?Goal: Cardiovascular complication will be avoided ?Outcome: Progressing ?  ?Problem: Activity: ?Goal: Risk for activity intolerance will decrease ?Outcome: Progressing ?  ?Problem: Coping: ?Goal: Level of anxiety will decrease ?Outcome: Progressing ?  ?Problem: Pain Managment: ?Goal: General experience of comfort will improve ?Outcome: Progressing ?  ?Problem: Safety: ?Goal: Ability to remain free from injury will improve ?Outcome: Progressing ?  ?Problem: Skin Integrity: ?Goal: Risk for impaired skin integrity will decrease ?Outcome: Progressing ?  ?

## 2020-03-05 LAB — CBC
HCT: 23.7 % — ABNORMAL LOW (ref 36.0–46.0)
Hemoglobin: 7.5 g/dL — ABNORMAL LOW (ref 12.0–15.0)
MCH: 26.4 pg (ref 26.0–34.0)
MCHC: 31.6 g/dL (ref 30.0–36.0)
MCV: 83.5 fL (ref 80.0–100.0)
Platelets: 158 10*3/uL (ref 150–400)
RBC: 2.84 MIL/uL — ABNORMAL LOW (ref 3.87–5.11)
RDW: 15.6 % — ABNORMAL HIGH (ref 11.5–15.5)
WBC: 8.4 10*3/uL (ref 4.0–10.5)
nRBC: 0 % (ref 0.0–0.2)

## 2020-03-05 LAB — BASIC METABOLIC PANEL
Anion gap: 8 (ref 5–15)
BUN: 17 mg/dL (ref 8–23)
CO2: 24 mmol/L (ref 22–32)
Calcium: 8.6 mg/dL — ABNORMAL LOW (ref 8.9–10.3)
Chloride: 101 mmol/L (ref 98–111)
Creatinine, Ser: 0.84 mg/dL (ref 0.44–1.00)
GFR calc Af Amer: >60 mL/min (ref >60)
GFR calc non Af Amer: 59 mL/min — ABNORMAL LOW (ref >60)
Glucose, Bld: 104 mg/dL — ABNORMAL HIGH (ref 70–99)
Potassium: 3.8 mmol/L (ref 3.5–5.1)
Sodium: 133 mmol/L — ABNORMAL LOW (ref 135–145)

## 2020-03-05 LAB — SARS CORONAVIRUS 2 (TAT 6-24 HRS): SARS Coronavirus 2: NEGATIVE

## 2020-03-05 MED ORDER — NICOTINE 7 MG/24HR TD PT24: 7.0000 mg | MEDICATED_PATCH | Freq: Every day | TRANSDERMAL | 0 refills | Status: AC

## 2020-03-05 MED ORDER — LIDOCAINE 5 % EX PTCH: 1.0000 | MEDICATED_PATCH | CUTANEOUS | 0 refills | Status: AC

## 2020-03-05 MED ORDER — METHOCARBAMOL 500 MG PO TABS: 250.0000 mg | ORAL_TABLET | Freq: Two times a day (BID) | ORAL | 0 refills | Status: AC

## 2020-03-05 MED ORDER — ALPRAZOLAM 0.25 MG PO TABS: 0.2500 mg | ORAL_TABLET | Freq: Every evening | ORAL | 0 refills | Status: DC | PRN

## 2020-03-05 MED ORDER — BISACODYL 10 MG RE SUPP
10.0000 mg | Freq: Once | RECTAL | Status: AC
  Administered 2020-03-05: 10 mg via RECTAL
  Filled 2020-03-05: qty 1

## 2020-03-05 MED ORDER — METHOCARBAMOL 500 MG PO TABS
250.0000 mg | ORAL_TABLET | Freq: Two times a day (BID) | ORAL | Status: DC
  Administered 2020-03-05: 250 mg via ORAL
  Filled 2020-03-05: qty 1

## 2020-03-05 MED ORDER — CALCIUM CARBONATE ANTACID 500 MG PO CHEW: 500.0000 mg | CHEWABLE_TABLET | Freq: Two times a day (BID) | ORAL | 0 refills | Status: AC

## 2020-03-05 MED ORDER — ALPRAZOLAM 0.25 MG PO TABS: 0.2500 mg | ORAL_TABLET | Freq: Every evening | ORAL | 0 refills | Status: AC | PRN

## 2020-03-05 MED ORDER — TRAMADOL HCL 50 MG PO TABS: 50.0000 mg | ORAL_TABLET | Freq: Two times a day (BID) | ORAL | 0 refills | Status: AC | PRN

## 2020-03-05 MED ORDER — SENNOSIDES-DOCUSATE SODIUM 8.6-50 MG PO TABS: 1.0000 | ORAL_TABLET | Freq: Two times a day (BID) | ORAL | 0 refills | Status: AC

## 2020-03-05 MED ORDER — TRAMADOL HCL 50 MG PO TABS: 50.0000 mg | ORAL_TABLET | Freq: Two times a day (BID) | ORAL | 0 refills | Status: DC | PRN

## 2020-03-05 NOTE — Progress Notes (Signed)
Bladder scan showed >787,  MD notified, orders given for I/O cath once, I/O cath yielded . Post I/O cath, bladder scan shows 0.

## 2020-03-05 NOTE — Discharge Instructions (Signed)
Spinal Compression Fracture  A spinal compression fracture is a collapse of the bones that form the spine (vertebrae). With this type of fracture, the vertebrae become pushed (compressed) into a wedge shape. Most compression fractures happen in the middle or lower part of the spine. What are the causes? This condition may be caused by:  Thinning and loss of density in the bones (osteoporosis). This is the most common cause.  A fall.  A car or motorcycle accident.  Cancer.  Trauma, such as a heavy, direct hit to the head or back. What increases the risk? You are more likely to develop this condition if:  You are 62 years or older.  You have osteoporosis.  You have certain types of cancer, including: ? Multiple myeloma. ? Lymphoma. ? Prostate cancer. ? Lung cancer. ? Breast cancer. What are the signs or symptoms? Symptoms of this condition include:  Severe pain.  Pain that gets worse over time.  Pain that is worse when you stand, walk, sit, or bend.  Sudden pain that is so bad that it is hard for you to move.  Bending or humping of the spine.  Gradual loss of height.  Numbness, tingling, or weakness in the back and legs.  Trouble walking. Your symptoms will depend on the cause of the fracture and how quickly it develops. How is this diagnosed? This condition may be diagnosed based on symptoms, medical history, and a physical exam. During the physical exam, your health care provider may tap along the length of your spine to check for tenderness. Tests may be done to confirm the diagnosis. They may include:  A bone mineral density test to check for osteoporosis.  Imaging tests, such as a spine X-ray, CT scan, or MRI. How is this treated? Treatment for this condition depends on the cause and severity of the condition. Some fractures may heal on their own with supportive care. Treatment may include:  Pain medicine.  Rest.  A back brace.  Physical therapy  exercises.  Medicine to strengthen bone.  Calcium and vitamin D supplements. Fractures that cause the back to become misshapen, cause nerve pain or weakness, or do not respond to other treatment may be treated with surgery. This may include:  Vertebroplasty. Bone cement is injected into the collapsed vertebrae to stabilize them.  Balloon kyphoplasty. The collapsed vertebrae are expanded with a balloon and then bone cement is injected into them.  Spinal fusion. The collapsed vertebrae are connected (fused) to normal vertebrae. Follow these instructions at home: Medicines  Take over-the-counter and prescription medicines only as told by your health care provider.  Do not drive or operate heavy machinery while taking prescription pain medicine.  If you are taking prescription pain medicine, take actions to prevent or treat constipation. Your health care provider may recommend that you: ? Drink enough fluid to keep your urine pale yellow. ? Eat foods that are high in fiber, such as fresh fruits and vegetables, whole grains, and beans. ? Limit foods that are high in fat and processed sugars, such as fried or sweet foods. ? Take an over-the-counter or prescription medicine for constipation. If you have a brace:  Wear the brace as told by your health care provider. Remove it only as told by your health care provider.  Loosen the brace if your fingers or toes tingle, become numb, or turn cold and blue.  Keep the brace clean.  If the brace is not waterproof: ? Do not let it get wet. ?  Cover it with a watertight covering when you take a bath or a shower. Managing pain, stiffness, and swelling   If directed, apply ice to the injured area: ? If you have a removable brace, remove it as told by your health care provider. ? Put ice in a plastic bag. ? Place a towel between your skin and the bag. ? Leave the ice on for 30 minutes every two hours at first. Then apply the ice as needed.  Activity  Rest as told by your health care provider. ? Avoid sitting for a long time without moving. Get up to take short walks every 1-2 hours. This is important to improve blood flow and breathing. Ask for help if you feel weak or unsteady.  Return to your normal activities as directed by your health care provider. Ask what activities are safe for you.  Do exercises to improve motion and strength in your back (physical therapy), as recommended by your health care provider.  Exercise regularly as directed by your health care provider. General instructions   Do not drink alcohol. Alcohol can interfere with your treatment.  Do not use any products that contain nicotine or tobacco, such as cigarettes and e-cigarettes. These can delay bone healing. If you need help quitting, ask your health care provider.  Keep all follow-up visits as told by your health care provider. This is important. It can help to prevent permanent injury, disability, and long-lasting (chronic) pain. Contact a health care provider if:  You have a fever.  You develop a cough that makes your pain worse.  Your pain medicine is not helping.  Your pain does not get better over time.  You cannot return to your normal activities as planned or expected. Get help right away if:  Your pain is very bad and it suddenly gets worse.  You are unable to move any body part (paralysis) that is below the level of your injury.  You have numbness, tingling, or weakness in any body part that is below the level of your injury.  You cannot control your bladder or bowels. Summary  A spinal compression fracture is a collapse of the bones that form the spine (vertebrae).  With this type of fracture, the vertebrae become pushed (compressed) into a wedge shape.  Your symptoms and treatment will depend on the cause and severity of the fracture and how quickly it develops.  Some fractures may heal on their own with supportive care.  Fractures that cause the back to become misshapen, cause nerve pain or weakness, or do not respond to other treatment may be treated with surgery. This information is not intended to replace advice given to you by your health care provider. Make sure you discuss any questions you have with your health care provider. Document Revised: 02/06/2019 Document Reviewed: 01/22/2018 [employer] Patient Education  2020 [employer]  

## 2020-03-05 NOTE — Discharge Summary (Signed)
[facility] Discharge Summary   Patient: Linda Owens SHF:[id number]  PCP: Jerl Mina, MD  Date of admission: 03/02/2020   Date of discharge:  03/05/2020     Discharge Diagnoses:  Principal Problem:   Fall at home, initial encounter Active Problems:   Anemia in other chronic diseases classified elsewhere   Compression fracture of T2 vertebra (HCC)   Acute lower UTI   Fall   Depression   Admitted From: home Disposition:  SNF   Recommendations for Outpatient Follow-up:  1. PCP: follow up in 1 week, also needs a neurosurgical follow up 2. Follow up LABS/TEST:  none  Follow-up Information    Jerl Mina, MD. Schedule an appointment as soon as possible for a visit on 03/17/2020.   Specialty: Family Medicine Why: AT [id number] Contact information: [address] [place] [address] [phone]        Lucy Chris, MD. Schedule an appointment as soon as possible for a visit on 03/30/2020.   Specialty: Neurosurgery Why: AT [id number] Contact information: [address] [place] [address] [phone]          Diet recommendation: Cardiac diet  Activity: The patient is advised to gradually reintroduce usual activities, as tolerated  Discharge Condition: stable  Code Status: Full code   History of present illness: As per the H and P dictated on admission, "Taisia D Toman is a 86 y.o. female with medical history significant for hypogammaglobulinemia, dyslipidemia and TIA who was brought to the emergency room for evaluation of low back pain following a fall yesterday.  Patient states that she tripped and fell 1 day prior to her admission while she was outside feeding the birds.  She hit her head but denied having any loss of consciousness.  She subsequently developed worsening low back pain.  On the day of admission she complained of feeling very lightheaded and weak, this has been ongoing for a  couple of weeks but worse today. Her labs revealed hemoglobin of 7.6g/dl  compared to 11 over a year ago.  She has pyuria and CT scan of the thoracic spine showed superior endplate compression fracture at T12 with 20% loss of height. Attempted to do a review of systems but patient was in so much pain and did not cooperate with the exam  ED Course: 86 year old female who resides in an independent living facility was seen in the emergency room for evaluation of low back pain following a fall.  CT scan of the thoracic spine showed superior endplate compression fracture at T12 with 20% loss of height.  ED provider discussed with neuro surgery who recommended a TLSO brace and outpatient follow-up.  Patient to be admitted to the hospital for further evaluation"  Hospital Course:  Summary of her active problems in the hospital is as following.   1.  T12 acute compression fracture Mechanical fall Remote L1 compression fracture Acute on chronic low back pain Patient presents with a mechanical fall in her ALF. Trauma work-up was suggestive of acute superior endplate fracture with approximately 20% loss of height at T12. EDP discussed with neurosurgery and recommended outpatient follow-up in a TLSO brace with conservative measures. Patient does not have any focal deficit at the time of my evaluation. Reports pain in the back. No nausea no vomiting. PT recommends SNF. Pain control with scheduled robaxin and PRN tramadol.   2.  Acute on chronic low back pain. Spinal stenosis at L4-L5. Continue TLSO brace. Continue tramadol  3.  Iron  deficiency anemia IV iron provided. NO Active bleeding. Patient is on chronic iron therapy. H&H stable.  4.  UTI Overactive bladder Urinary retention  Patient has history of recurrent UTI. She is on chronic suppressive therapy with trimethoprim. Follows up with urology  on oxybutynin, now that she has retention. I think her inability to urinate is associated with pain control issues and her being hard of hearing so not wanting to  bother more than anything else. she was able to lift her legs against gravity/stand up and walk so it is not neurological from her fracture.  also her oxybutinin can cause retention. So will will d/c it.  completed 3-day treatment course for IV antibiotics followed by resumption of her long-term suppressive therapy.  5.  Constipation.  Nausea. Continue bowel regimen   6.  Depression Continue Effexor  7.  Active smoker. Patient smokes half pack a day. Nicotine patch.  8.  Goals of care discussion. Had a discussion with patient's daughter regarding goals of care.  Daughter is the POA. She reported she had this conversation in the past with the patient and she and patient currently would like to remain full code but would not like to be kept alive artificially if there is no hope.  9.  Pain control  - [place] Controlled Substance Reporting System database was reviewed. - 5 day supply was provided. - Patient was instructed, not to drive, operate heavy machinery, perform activities at heights, swimming or participation in water activities or provide baby sitting services while on Pain, Sleep and Anxiety Medications; until her outpatient Physician has advised to do so again.  - Also recommended to not to take more than prescribed Pain, Sleep and Anxiety Medications.  Patient was seen by physical therapy, who recommended SNF, which was arranged. On the day of the discharge the patient's vitals were stable, and no other acute medical condition were reported by patient. the patient was felt safe to be discharge at SNF with therapy.  Consultants: none Procedures: none  Discharge Exam: General: Appear in no distress, no Rash; Oral Mucosa Clear, moist. Cardiovascular: S1 and S2 Present, aortic systolic Murmur, Respiratory: normal respiratory effort, Bilateral Air entry present and no Crackles, no wheezes Abdomen: Bowel Sound present, Soft and no tenderness, no hernia Extremities:  no Pedal edema, no calf tenderness Neurology: alert and oriented to time, place, and person affect appropriate.  Filed Weights   03/02/20 0915 03/02/20 0917  Weight: 56.7 kg 57.2 kg   Vitals:   03/04/20 2338 03/05/20 0739  BP: (!) 115/58 (!) 103/50  Pulse: 81 79  Resp: 17 17  Temp: 98.3 F (36.8 C) 98.2 F (36.8 C)  SpO2: 94% 95%    DISCHARGE MEDICATION: Allergies as of 03/05/2020      Reactions   Nitrofurantoin Rash      Medication List    TAKE these medications   ALPRAZolam 0.25 MG tablet Commonly known as: XANAX Take 1 tablet (0.25 mg total) by mouth at bedtime as needed for anxiety.   aspirin 81 MG EC tablet Take 81 mg by mouth daily.   calcium carbonate 500 MG chewable tablet Commonly known as: TUMS - dosed in mg elemental calcium Chew 2.5 tablets (500 mg of elemental calcium total) by mouth 2 (two) times daily with a meal.   ferrous sulfate 325 (65 FE) MG EC tablet Take 65 mg by mouth daily.   lidocaine 5 % Commonly known as: LIDODERM Place 1 patch onto the skin daily. Remove &  Discard patch within 12 hours or as directed by MD What changed: See the new instructions.   MACULAR VITAMIN BENEFIT PO Take 1 tablet by mouth in the morning and at bedtime.   Melatonin 5 MG Tabs Take 5 mg by mouth at bedtime.   methocarbamol 500 MG tablet Commonly known as: ROBAXIN Take 0.5 tablets (250 mg total) by mouth 2 (two) times daily.   nicotine 7 mg/24hr patch Commonly known as: NICODERM CQ - dosed in mg/24 hr Place 1 patch (7 mg total) onto the skin daily.   oxybutynin 10 MG 24 hr tablet Commonly known as: DITROPAN-XL Take 1 tablet (10 mg total) by mouth daily.   senna-docusate 8.6-50 MG tablet Commonly known as: Senokot-S Take 1 tablet by mouth 2 (two) times daily.   simvastatin 20 MG tablet Commonly known as: ZOCOR TAKE 1 TABLET DAILY   traMADol 50 MG tablet Commonly known as: ULTRAM Take 1 tablet (50 mg total) by mouth every 12 (twelve) hours as  needed for moderate pain.   trimethoprim 100 MG tablet Commonly known as: TRIMPEX Take 1 tablet (100 mg total) by mouth daily.   venlafaxine XR 37.5 MG 24 hr capsule Commonly known as: EFFEXOR-XR Take 37.5 mg by mouth daily with breakfast.      Allergies  Allergen Reactions  . Nitrofurantoin Rash   Discharge Instructions    Diet - low sodium heart healthy   Complete by: As directed    Increase activity slowly   Complete by: As directed       The results of significant diagnostics from this hospitalization (including imaging, microbiology, ancillary and laboratory) are listed below for reference.    Significant Diagnostic Studies: DG Abd 1 View  Result Date: 03/04/2020 CLINICAL DATA:  Constipation EXAM: ABDOMEN - 1 VIEW COMPARISON:  None. FINDINGS: Air-filled loops of colon and small bowel. Stool burden is mild. Surgical clips overlie the pelvis. Degenerative changes of the lumbar spine. Compression deformities better evaluated on recent CT imaging. IMPRESSION: Mild stool burden. Electronically Signed   By: Guadlupe Spanish M.D.   On: 03/04/2020 20:48   CT Head Wo Contrast  Result Date: 03/02/2020 CLINICAL DATA:  Larey Seat, back pain, hit head EXAM: CT HEAD WITHOUT CONTRAST TECHNIQUE: Contiguous axial images were obtained from the base of the skull through the vertex without intravenous contrast. COMPARISON:  None. FINDINGS: Brain: No evidence of acute infarct or hemorrhage. Confluent hypodensities throughout the periventricular and subcortical white matter consistent with chronic small vessel ischemic changes. The lateral ventricles and midline structures are unremarkable. No acute extra-axial fluid collections. No mass effect. Vascular: No hyperdense vessel or unexpected calcification. Skull: Normal. Negative for fracture or focal lesion. Sinuses/Orbits: No acute finding. Other: None IMPRESSION: 1. Chronic small-vessel ischemic changes throughout the white matter. No acute intracranial  trauma. Electronically Signed   By: Sharlet Salina M.D.   On: 03/02/2020 11:14   CT Cervical Spine Wo Contrast  Result Date: 03/02/2020 CLINICAL DATA:  Larey Seat, back pain EXAM: CT CERVICAL SPINE WITHOUT CONTRAST TECHNIQUE: Multidetector CT imaging of the cervical spine was performed without intravenous contrast. Multiplanar CT image reconstructions were also generated. COMPARISON:  None. FINDINGS: Alignment: Cervical vertebral bodies are in grossly anatomic alignment. Skull base and vertebrae: No acute displaced fractures. Soft tissues and spinal canal: No prevertebral fluid or swelling. No visible canal hematoma. Disc levels: There is extensive multilevel spondylosis and facet hypertrophy. There is bony fusion across the facet joints from C2 through C4 bilateral and at C7/T1 bilaterally.  Prominent hypertrophic changes are seen at the craniocervical junction and C1/C2 interface. Upper chest: Airway is patent.  Lung apices are clear. Other: Reconstructed images demonstrate no additional findings. IMPRESSION: 1. No acute cervical spine fracture. 2. Extensive multilevel cervical spondylosis and facet hypertrophy. Electronically Signed   By: Sharlet SalinaMichael  Brown M.D.   On: 03/02/2020 11:17   CT Thoracic Spine Wo Contrast  Result Date: 03/02/2020 CLINICAL DATA:  Fall from standing position. Back pain. EXAM: CT THORACIC SPINE WITHOUT CONTRAST TECHNIQUE: Multidetector CT images of the thoracic were obtained using the standard protocol without intravenous contrast. COMPARISON:  Lumbar spine CT of the same day. FINDINGS: Alignment: No significant listhesis is present. Vertebrae: Superior endplate fracture at T12 demonstrates 20% loss of height. This is better seen on the lumbar spine CT. No additional fractures are present. Paraspinal and other soft tissues: Extensive atherosclerotic changes are noted in the aorta branch vessels. Coronary artery calcifications are present. Mild dependent atelectasis is present in the lungs  bilaterally. Disc levels: No significant stenosis is present. IMPRESSION: 1. Superior endplate compression fracture at T12 with 20% loss of height. 2. No additional fractures. 3. Aortic Atherosclerosis (ICD10-I70.0). Electronically Signed   By: Marin Robertshristopher  Mattern M.D.   On: 03/02/2020 11:40   CT Lumbar Spine Wo Contrast  Result Date: 03/02/2020 CLINICAL DATA:  Mechanical fall. Back pain. Fall from standing position. EXAM: CT LUMBAR SPINE WITHOUT CONTRAST TECHNIQUE: Multidetector CT imaging of the lumbar spine was performed without intravenous contrast administration. Multiplanar CT image reconstructions were also generated. COMPARISON:  None. FINDINGS: Segmentation: 5 non rib-bearing lumbar type vertebral bodies are present. The lowest fully formed vertebral body is L5. Alignment: Grade 1 anterolisthesis at L5-S1 measures 4 mm, associated with bilateral pars defects. No other significant listhesis is present. Rightward curvature is centered at L3. Vertebrae: Prominent superior endplate Schmorl's nodes are present at L2 and L3. A remote superior endplate fracture present at L1 with 70% loss of height anteriorly and no significant retropulsed bone. No compressed bone or paraspinous hematoma is present to suggest acute fracture. A superior endplate fracture at T12 does appear acute. 20% loss of height is noted centrally. No retropulsed bone is present. Paraspinal and other soft tissues: Atherosclerotic changes are noted in the aorta and branch vessels. No aneurysm is present. No solid organ lesion is present. No significant retroperitoneal adenopathy is present. Disc levels: T11-12: Moderate facet hypertrophy is noted bilaterally. No significant stenosis is present. T12-L1: Bilateral facet hypertrophy is worse right than left. Endplate spurring contributes to mild right foraminal narrowing is well. L1-2: A broad-based disc protrusion and bilateral facet hypertrophy results in mild left subarticular and left  greater than right foraminal stenosis. L2-3: Asymmetric left-sided facet hypertrophy is present. No significant stenosis is present. L3-4: A broad-based disc protrusion is present. Moderate facet hypertrophy is worse left than right. Mild left subarticular narrowing is present. Moderate foraminal stenosis is worse on the left. L4-5: A broad-based disc protrusion is uncovered scratched at a broad-based disc protrusion is present. Moderate facet hypertrophy is noted bilaterally. Moderate central and bilateral foraminal stenosis is worse on the left. L5-S1: There is slight uncovering of a broad-based disc protrusion. Moderate facet hypertrophy is noted bilaterally. No significant stenosis is present. IMPRESSION: 1. Acute superior endplate fracture with proximally 20% loss of height at T12. No retropulsed bone. 2. The more severe L1 fracture appears remote. 3. Prominent remote superior endplate Schmorl's nodes at L2 and L3. These may be posttraumatic. 4. Multilevel degenerative changes as described.  5. Moderate central canal stenosis is most severe at L4-5. Central canal narrowing is also present at L2-3 and L3-4. Electronically Signed   By: Marin Roberts M.D.   On: 03/02/2020 11:25    Microbiology: Recent Results (from the past 240 hour(s))  SARS CORONAVIRUS 2 (TAT 6-24 HRS) Nasopharyngeal Nasopharyngeal Swab     Status: None   Collection Time: 03/02/20  2:43 PM   Specimen: Nasopharyngeal Swab  Result Value Ref Range Status   SARS Coronavirus 2 NEGATIVE NEGATIVE Final    Comment: (NOTE) SARS-CoV-2 target nucleic acids are NOT DETECTED. The SARS-CoV-2 RNA is generally detectable in upper and lower respiratory specimens during the acute phase of infection. Negative results do not preclude SARS-CoV-2 infection, do not rule out co-infections with other pathogens, and should not be used as the sole basis for treatment or other patient management decisions. Negative results must be combined with  clinical observations, patient history, and epidemiological information. The expected result is Negative. Fact Sheet for Patients: HairSlick.no Fact Sheet for Healthcare Providers: quierodirigir.com This test is not yet approved or cleared by the [place] FDA and  has been authorized for detection and/or diagnosis of SARS-CoV-2 by FDA under an Emergency Use Authorization (EUA). This EUA will remain  in effect (meaning this test can be used) for the duration of the COVID-19 declaration under Section 56 4(b)(1) of the Act, 21 U.S.C. section 360bbb-3(b)(1), unless the authorization is terminated or revoked sooner. Performed at [facility], [address] [place], [place] [address]   MRSA PCR Screening     Status: None   Collection Time: 03/03/20 12:55 AM   Specimen: Nasal Mucosa; Nasopharyngeal  Result Value Ref Range Status   MRSA by PCR NEGATIVE NEGATIVE Final    Comment:        The GeneXpert MRSA Assay (FDA approved for NASAL specimens only), is one component of a comprehensive MRSA colonization surveillance program. It is not intended to diagnose MRSA infection nor to guide or monitor treatment for MRSA infections. Performed at [facility], [address] [place], [place] [address]   SARS CORONAVIRUS 2 (TAT 6-24 HRS) Nasopharyngeal Nasopharyngeal Swab     Status: None   Collection Time: 03/04/20  9:10 PM   Specimen: Nasopharyngeal Swab  Result Value Ref Range Status   SARS Coronavirus 2 NEGATIVE NEGATIVE Final    Comment: (NOTE) SARS-CoV-2 target nucleic acids are NOT DETECTED. The SARS-CoV-2 RNA is generally detectable in upper and lower respiratory specimens during the acute phase of infection. Negative results do not preclude SARS-CoV-2 infection, do not rule out co-infections with other pathogens, and should not be used as the sole basis for treatment or other patient management  decisions. Negative results must be combined with clinical observations, patient history, and epidemiological information. The expected result is Negative. Fact Sheet for Patients: HairSlick.no Fact Sheet for Healthcare Providers: quierodirigir.com This test is not yet approved or cleared by the [place] FDA and  has been authorized for detection and/or diagnosis of SARS-CoV-2 by FDA under an Emergency Use Authorization (EUA). This EUA will remain  in effect (meaning this test can be used) for the duration of the COVID-19 declaration under Section 56 4(b)(1) of the Act, 21 U.S.C. section 360bbb-3(b)(1), unless the authorization is terminated or revoked sooner. Performed at [facility], [address], [place], [place] [address]      Labs: CBC: Recent Labs  Lab 03/02/20 1048 03/02/20 1443 03/03/20 0437 03/04/20 0344 03/05/20  0559  WBC 10.9* 10.8* 10.3 8.9 8.4  NEUTROABS 9.1*  --   --   --   --   HGB 7.9* 7.6* 7.8* 7.8* 7.5*  HCT 25.0* 24.5* 25.0* 24.4* 23.7*  MCV 83.9 84.2 83.3 82.7 83.5  PLT 184 187 174 163 546   Basic Metabolic Panel: Recent Labs  Lab 03/02/20 1048 03/03/20 0437 03/04/20 0344 03/05/20 0559  NA 135 136 134* 133*  K 4.1 4.0 4.0 3.8  CL 101 102 101 101  CO2 24 25 26 24   GLUCOSE 109* 106* 113* 104*  BUN 16 16 19 17   CREATININE 0.82 0.81 0.77 0.84  CALCIUM 8.9 8.7* 9.0 8.6*  MG  --   --  2.1  --    Liver Function Tests: No results for input(s): AST, ALT, ALKPHOS, BILITOT, PROT, ALBUMIN in the last 168 hours. No results for input(s): LIPASE, AMYLASE in the last 168 hours. No results for input(s): AMMONIA in the last 168 hours. Cardiac Enzymes: No results for input(s): CKTOTAL, CKMB, CKMBINDEX, TROPONINI in the last 168 hours. BNP (last 3 results) No results for input(s): BNP in the last 8760 hours. CBG: No results for input(s): GLUCAP in the last 168 hours.  Time spent: 35  minutes  Signed:  Berle Mull  [facility]  03/05/2020 1:55 PM

## 2020-03-05 NOTE — TOC Progression Note (Signed)
Transition of Care [facility]) - Progression Note    Patient Details  Name: ERSIE SAVINO MRN: [id number] Date of Birth: 03-18-1924  [facility] CM/SW Contact  Barrie Dunker, RN Phone Number: 03/05/2020, 1:51 PM  Clinical Narrative:    Valentina Shaggy with [facility] and notified her that the DC was cancelled due to urinary retention She stated understanding   Expected Discharge Plan: Home w Home Health Services Barriers to Discharge: Continued Medical Work up  Expected Discharge Plan and Services Expected Discharge Plan: Home w Home Health Services   Discharge Planning Services: CM Consult   Living arrangements for the past 2 months: Independent Living Facility Expected Discharge Date: 03/05/20               DME Arranged: N/A         HH Arranged: [facility] has their own PT services)           Social Determinants of Health (SDOH) Interventions    Readmission Risk Interventions No flowsheet data found.

## 2020-03-05 NOTE — Progress Notes (Addendum)
Physical Therapy Treatment Patient Details Name: Linda Owens MRN: [id number] DOB: November 07, 1924 Today's Date: 03/05/2020    History of Present Illness Pt is a 86 y.o. female with medical history significant for hypogammaglobulinemia, dyslipidemia, current smoker and TIA who was brought to the emergency room for evaluation of low back pain following a fall yesterday. Work up showed superior endplate compression fracture at T12 with 20% loss of height.  Neuro surgery who recommended a TLSO brace and outpatient follow-up.    PT Comments    Pt ready for session.  Comfortable at rest.  Participated in exercises as described below.  Attempted mobility.  Mod a x 1 for tactile and verbal cues for log rolling/side lying.  She is able to get 1/2 way to sitting before self initiating return to supine due to pain.  After rest, she refuses to attempt again "Let's not."   Education provided regarding risks/benefits of mobility.   Follow Up Recommendations  SNF     Equipment Recommendations  Rolling walker with 5" wheels    Recommendations for Other Services       Precautions / Restrictions Precautions Precautions: Fall Precaution Comments: T12 compression fx Required Braces or Orthoses: Spinal Brace Spinal Brace: Thoracolumbosacral orthotic Restrictions Weight Bearing Restrictions: No    Mobility  Bed Mobility Overal bed mobility: Needs Assistance Bed Mobility: Rolling;Sit to Supine Rolling: Mod assist         General bed mobility comments: iniated supine to sit.  Pt was able to get 1/2 was sitting but due to pain initiated self return to bed.  "Let's not."  Transfers                    Ambulation/Gait                 Stairs             Wheelchair Mobility    Modified Rankin (Stroke Patients Only)       Balance                                            Cognition Arousal/Alertness: Lethargic(slightly lethargic) Behavior During  Therapy: WFL for tasks assessed/performed Overall Cognitive Status: Difficult to assess                                 General Comments: Pt was seated in recliner prior to PT session. Per RN, pt requesting to get back in bed 2/2 to pain. Pt is HOH and required alot of increased time and repeating questions to respond to therapist questions. unable to rate pain but facial expression shows increase pain this date.      Exercises Other Exercises Other Exercises: BLE AA/AROM - ankle pumps, heel slides, ab/add x 10    General Comments        Pertinent Vitals/Pain Pain Assessment: Faces Faces Pain Scale: Hurts whole lot Pain Location: with attempts at sitting Pain Descriptors / Indicators: Grimacing;Moaning;Constant;Aching    Home Living                      Prior Function            PT Goals (current goals can now be found in the care plan section) Progress towards PT goals: Progressing toward goals  Frequency    7X/week      PT Plan Current plan remains appropriate    Co-evaluation              AM-PAC PT "6 Clicks" Mobility   Outcome Measure  Help needed turning from your back to your side while in a flat bed without using bedrails?: A Lot Help needed moving from lying on your back to sitting on the side of a flat bed without using bedrails?: A Lot Help needed moving to and from a bed to a chair (including a wheelchair)?: A Lot Help needed standing up from a chair using your arms (e.g., wheelchair or bedside chair)?: A Little Help needed to walk in hospital room?: A Little Help needed climbing 3-5 steps with a railing? : A Lot 6 Click Score: 14    End of Session Equipment Utilized During Treatment: Gait belt Activity Tolerance: Patient limited by pain Patient left: in bed;with call bell/phone within reach;with bed alarm set;with nursing/sitter in room         Time: 1050-1058 PT Time Calculation (min) (ACUTE ONLY): 8  min  Charges:  $Therapeutic Exercise: 8-22 mins                    Chesley Noon, PTA 03/05/20, 11:25 AM

## 2020-03-05 NOTE — Progress Notes (Signed)
Report given to [facility]. EMS arrived for transportation. NAD, VSS.

## 2020-03-05 NOTE — Care Management Important Message (Signed)
Important Message  Patient Details  Name: Linda Owens MRN: [id number] Date of Birth: 1924/01/18   Medicare Important Message Given:  N/A - LOS <3 / Initial given by admissions     [name] [name] 03/05/2020, 8:40 AM

## 2020-03-05 NOTE — Plan of Care (Signed)
  Problem: Education: Goal: Knowledge of General Education information will improve Description: Including pain rating scale, medication(s)/side effects and non-pharmacologic comfort measures Outcome: Progressing   Problem: Health Behavior/Discharge Planning: Goal: Ability to manage health-related needs will improve Outcome: Not Progressing   Problem: Clinical Measurements: Goal: Ability to maintain clinical measurements within normal limits will improve Outcome: Progressing Goal: Will remain free from infection Outcome: Progressing Goal: Diagnostic test results will improve Outcome: Adequate for Discharge Goal: Respiratory complications will improve Outcome: Adequate for Discharge Goal: Cardiovascular complication will be avoided Outcome: Adequate for Discharge   Problem: Coping: Goal: Level of anxiety will decrease Outcome: Progressing   Problem: Elimination: Goal: Will not experience complications related to bowel motility Outcome: Not Progressing KUB results. Mild constipation.   Problem: Skin Integrity: Goal: Risk for impaired skin integrity will decrease Outcome: Progressing  Not Progressing Goal: Will not experience complications related to urinary retention Outcome: Progressing

## 2020-03-05 NOTE — Progress Notes (Signed)
Attempted to call [facility] for report on patient. No answer at this time. Will try again once EMS arrives for transportation.

## 2020-03-05 NOTE — TOC Transition Note (Signed)
Transition of Care [facility]) - CM/SW Discharge Note   Patient Details  Name: Linda Owens MRN: [id number] Date of Birth: 06-26-1924  Transition of Care [facility]) CM/SW Contact:  Barrie Dunker, RN Phone Number: 03/05/2020, 2:14 PM   Clinical Narrative:    Patient DC to [facility] room 212, via EMS transport she is 3rd on the EMS list, The bed side nurse is going to call report, and is ready to have EMS pick up, the [facility] called EMS, I called her daughter Linda Owens and made her aware as well, no additional needs      Barriers to Discharge: Continued Medical Work up   Patient Goals and CMS Choice Patient states their goals for this hospitalization and ongoing recovery are:: go back to her home      Discharge Placement                       Discharge Plan and Services   Discharge Planning Services: CM Consult            DME Arranged: N/A         HH Arranged: ([facility] has their own PT services)          Social Determinants of Health (SDOH) Interventions     Readmission Risk Interventions No flowsheet data found.

## 2020-03-09 DIAGNOSIS — E441 Mild protein-calorie malnutrition: Secondary | ICD-10-CM | POA: Diagnosis not present

## 2020-03-09 DIAGNOSIS — F39 Unspecified mood [affective] disorder: Secondary | ICD-10-CM | POA: Diagnosis not present

## 2020-03-09 DIAGNOSIS — N3281 Overactive bladder: Secondary | ICD-10-CM | POA: Diagnosis not present

## 2020-03-09 DIAGNOSIS — S22080D Wedge compression fracture of T11-T12 vertebra, subsequent encounter for fracture with routine healing: Secondary | ICD-10-CM | POA: Diagnosis not present

## 2020-05-20 ENCOUNTER — Ambulatory Visit: Admit: 2020-05-20 | Discharge: 2020-05-20 | Payer: MEDICARE | Attending: Registered Nurse | Primary: Registered Nurse

## 2020-05-20 DIAGNOSIS — Z7689 Persons encountering health services in other specified circumstances: Secondary | ICD-10-CM

## 2020-05-20 NOTE — Progress Notes (Signed)
Patient: Lauren Bryant is a 86 y.o. female who presents today with the following Chief Complaint(s):  Chief Complaint   Patient presents with   ??? New Patient     compression fracture on 03/08   ??? Other     questions regarding stool softener   ??? Nicotine Dependence     questions regarding nicorette gum   ??? Other     nightmares, and night sweats   ??? Dizziness     intermittent dizziness, concerns of blood pressure       Assessment:  Encounter Diagnoses   Name Primary?   ??? Encounter to establish care Yes   ??? Encounter for smoking cessation counseling    ??? Hyperlipidemia, unspecified hyperlipidemia type    ??? Compression fracture of body of thoracic vertebra (HCC)    ??? Frequent UTI    ??? Nightmares    ??? Low blood pressure reading        Plan:  1. Encounter to establish care  See below    2. Encounter for smoking cessation counseling  Continue with the gum. Slowly wean down on amount of nicotine over the next 12 weeks.     3. Hyperlipidemia, unspecified hyperlipidemia type  Stable, continue with simvastatin daily. Patient tolerates it well.     4. Compression fracture of body of thoracic vertebra [facility])  Patient reports improvement, continue with lidocaine patch and tylenol and Ibuprofen as needed    5. Frequent UTI  Continue with trimethoprim daily.     6. Nightmares  Try stopping melatonin and see if symptoms improve.     7. Low blood pressure reading  She has low BP normally in the 90's. Daughter has not seen it above 100. Advised patient to increase her water intake and we will recheck her blood pressure in 1 month, may be the cause of her dizziness or it could be related to her melatonin. Will see if symptoms improve in a month.       HPI   Patient presents today to establish care. She just moved here from [place]. She is currently living at the [place].   She fell in March while living in [place] and suffered from a compression fracture at T-12. She is doing much better now. She states she still  occassionally has pain. She uses tylenol and Ibuprofen as needed a lidocaine patch as needed.   Did physical therapy rehab after being in the hospital for a month.  She quit smoking when she fell on March 3rd, she has been doing the gum 4mg  3 times a day.      Anxiety- she is on xanax as needed but does not take this daily.     Nightmare and night sweats- 2-3 times a week. She states this has been going on a long time.     She has a history of frequent UTI's. She has been on trimethoprim for prevention for the past 2 years and it has been working well.   She has concerns today with dizziness off and on, she has concerns about her back and the night sweats and night mares.     Current Outpatient Medications   Medication Sig Dispense Refill   ??? docusate sodium (STOOL SOFTENER) 100 MG capsule Take 100 mg by mouth 2 times daily     ??? venlafaxine (EFFEXOR) 37.5 MG tablet Take 37.5 mg by mouth daily     ??? trimethoprim (TRIMPEX) 100 MG tablet Take  100 mg by mouth daily     ??? ferrous sulfate 325 (65 Fe) MG tablet Take 325 mg by mouth daily (with breakfast)     ??? simvastatin (ZOCOR) 20 MG tablet Take 20 mg by mouth nightly     ??? tolterodine (DETROL LA) 4 MG ER capsule Take 4 mg by mouth daily     ??? aspirin 81 MG tablet Take 81 mg by mouth daily     ??? ALPRAZolam (XANAX) 0.25 MG tablet Take 0.25 mg by mouth 3 times daily as needed for Sleep       No current facility-administered medications for this visit.       Patient's past medical history, surgical history, family history, medications,and allergies  were all reviewed and updated as appropriate today.      Review of Systems   Constitutional: Negative.    HENT: Negative.    Respiratory: Negative.    Cardiovascular: Negative.    Gastrointestinal: Negative.    Musculoskeletal: Positive for back pain.   Skin: Negative.    Neurological: Positive for dizziness.   Psychiatric/Behavioral: Positive for sleep disturbance. The patient is nervous/anxious.          Physical Exam  Vitals  reviewed.   Cardiovascular:      Rate and Rhythm: Normal rate and regular rhythm.      Heart sounds: Normal heart sounds.   Pulmonary:      Effort: Pulmonary effort is normal.      Breath sounds: Normal breath sounds.   Abdominal:      General: Bowel sounds are normal.   Skin:     General: Skin is warm and dry.   Neurological:      Mental Status: She is alert and oriented to person, place, and time.   Psychiatric:         Mood and Affect: Mood normal.         Behavior: Behavior normal.       Vitals:    05/20/20 1341   BP: (!) 88/40   Pulse:    Temp:    SpO2:

## 2020-05-20 NOTE — Patient Instructions (Addendum)
Stop melatonin  Try to wean down on nicorette gum  Follow up in 1 month.

## 2020-07-12 ENCOUNTER — Ambulatory Visit: Admit: 2020-07-12 | Discharge: 2020-07-12 | Payer: MEDICARE | Attending: Registered Nurse | Primary: Registered Nurse

## 2020-07-12 DIAGNOSIS — R232 Flushing: Secondary | ICD-10-CM

## 2020-07-12 MED ORDER — SIMVASTATIN 20 MG PO TABS
20 MG | ORAL_TABLET | Freq: Every evening | ORAL | 1 refills | Status: DC
Start: 2020-07-12 — End: 2021-01-24

## 2020-07-12 NOTE — Patient Instructions (Signed)
Could try increasing venlafaxine to 1 tablet twice a day if symptoms continue with nightmares and hot flashes.    Could also try Black cohosh that is available OTC to help with hot flashes.

## 2020-07-12 NOTE — Progress Notes (Signed)
Patient: Lauren Bryant is a 86 y.o. female who presents today with the following Chief Complaint(s):  Chief Complaint   Patient presents with   ??? Follow-up     still having concerns of sleep, hot flashes       Assessment:  Encounter Diagnoses   Name Primary?   ??? Hot flashes Yes   ??? Insomnia, unspecified type    ??? Hyperlipidemia, unspecified hyperlipidemia type    ??? Anemia, unspecified type        Plan:  1. Hot flashes  Daughter will monitor how often this is occurring over the next week.  May consider increasing Effexor to 1 tablet twice a day to see if helps with symptoms.  Daughter was also advised she could try black cohosh for symptoms as well.    2. Insomnia, unspecified type  We will monitor over the next week again we will consider increasing Effexor to twice a day.  3. Hyperlipidemia, unspecified hyperlipidemia type  - simvastatin (ZOCOR) 20 MG tablet; Take 1 tablet by mouth nightly  Dispense: 90 tablet; Refill: 1  - Comprehensive Metabolic Panel  - Lipid Panel    4. Anemia, unspecified type  - CBC Auto Differential  - Iron and TIBC  - Ferritin      HPI  Patient presents today with her daughter with ongoing concerns of insomnia and hot flashes.  Patient's daughter suspects that possibly symptoms have continued because the daughter was gone out of town for 3 weeks.  She suspects her mother had some depression symptoms while she was gone.  Patient still reports frequent waking during the night with hot flashes.  She reports some nightmares but it did seem to improve after stopping the melatonin.  She does have a history of anemia and is currently on iron supplement daily.  She is due for lab recheck.  Current Outpatient Medications   Medication Sig Dispense Refill   ??? simvastatin (ZOCOR) 20 MG tablet Take 1 tablet by mouth nightly 90 tablet 1   ??? docusate sodium (STOOL SOFTENER) 100 MG capsule Take 100 mg by mouth 2 times daily     ??? venlafaxine (EFFEXOR) 37.5 MG tablet Take 37.5 mg by mouth daily     ???  trimethoprim (TRIMPEX) 100 MG tablet Take 100 mg by mouth daily     ??? ferrous sulfate 325 (65 Fe) MG tablet Take 325 mg by mouth daily (with breakfast)     ??? tolterodine (DETROL LA) 4 MG ER capsule Take 4 mg by mouth daily     ??? aspirin 81 MG tablet Take 81 mg by mouth daily     ??? ALPRAZolam (XANAX) 0.25 MG tablet Take 0.25 mg by mouth 3 times daily as needed for Sleep       No current facility-administered medications for this visit.       Patient's past medical history, surgical history, family history, medications,and allergies  were all reviewed and updated as appropriate today.      Review of Systems   Constitutional: Negative.         Hotflashes   HENT: Negative.    Respiratory: Negative.    Cardiovascular: Negative.    Gastrointestinal: Negative.    Musculoskeletal: Negative.    Skin: Negative.    Psychiatric/Behavioral: Positive for sleep disturbance.         Physical Exam  Vitals reviewed.   Cardiovascular:      Rate and Rhythm: Normal rate and regular rhythm.  Heart sounds: Normal heart sounds.   Pulmonary:      Effort: Pulmonary effort is normal.      Breath sounds: Normal breath sounds.   Skin:     General: Skin is warm and dry.   Neurological:      Mental Status: She is alert and oriented to person, place, and time.       Vitals:    07/12/20 1333   BP: (!) 108/40   Pulse:    Temp:    SpO2:        This chart was generated using the Dragon dictation system.  I created this record but it may contain dictation errors due to the limitation of the software.

## 2020-07-13 LAB — COMPREHENSIVE METABOLIC PANEL
ALT: 18 U/L (ref 10–40)
AST: 24 U/L (ref 15–37)
Albumin/Globulin Ratio: 2.2 (ref 1.1–2.2)
Albumin: 4.7 g/dL (ref 3.4–5.0)
Alkaline Phosphatase: 47 U/L (ref 40–129)
Anion Gap: 15 (ref 3–16)
BUN: 16 mg/dL (ref 7–20)
CO2: 26 mmol/L (ref 21–32)
Calcium: 9.4 mg/dL (ref 8.3–10.6)
Chloride: 96 mmol/L — ABNORMAL LOW (ref 99–110)
Creatinine: 0.8 mg/dL (ref 0.6–1.2)
GFR African American: >60 (ref >60)
GFR Non-African American: >60 (ref >60)
Globulin: 2.1 g/dL
Glucose: 107 mg/dL — ABNORMAL HIGH (ref 70–99)
Potassium: 4.4 mmol/L (ref 3.5–5.1)
Sodium: 137 mmol/L (ref 136–145)
Total Bilirubin: 0.4 mg/dL (ref 0.0–1.0)
Total Protein: 6.8 g/dL (ref 6.4–8.2)

## 2020-07-13 LAB — CBC WITH AUTO DIFFERENTIAL
Basophils %: 1 %
Basophils Absolute: 0.1 10*3/uL (ref 0.0–0.2)
Eosinophils %: 1.5 %
Eosinophils Absolute: 0.1 10*3/uL (ref 0.0–0.6)
Hematocrit: 31.9 % — ABNORMAL LOW (ref 36.0–48.0)
Hemoglobin: 10.7 g/dL — ABNORMAL LOW (ref 12.0–16.0)
Lymphocytes %: 13.2 %
Lymphocytes Absolute: 0.7 10*3/uL — ABNORMAL LOW (ref 1.0–5.1)
MCH: 30.3 pg (ref 26.0–34.0)
MCHC: 33.4 g/dL (ref 31.0–36.0)
MCV: 90.6 fL (ref 80.0–100.0)
MPV: 12.1 fL — ABNORMAL HIGH (ref 5.0–10.5)
Monocytes %: 8.7 %
Monocytes Absolute: 0.5 10*3/uL (ref 0.0–1.3)
Neutrophils %: 75.6 %
Neutrophils Absolute: 4.2 10*3/uL (ref 1.7–7.7)
Platelets: 155 10*3/uL (ref 135–450)
RBC: 3.52 M/uL — ABNORMAL LOW (ref 4.00–5.20)
RDW: 15.5 % — ABNORMAL HIGH (ref 12.4–15.4)
WBC: 5.6 10*3/uL (ref 4.0–11.0)

## 2020-07-13 LAB — IRON AND TIBC
Iron % Saturation: 98 % — ABNORMAL HIGH (ref 15–50)
Iron: 318 ug/dL — ABNORMAL HIGH (ref 37–145)
TIBC: 323 ug/dL (ref 260–445)

## 2020-07-13 LAB — LIPID PANEL
Cholesterol, Total: 163 mg/dL (ref 0–199)
HDL: 70 mg/dL — ABNORMAL HIGH (ref 40–60)
LDL Calculated: 75 mg/dL (ref <100)
Triglycerides: 91 mg/dL (ref 0–150)
VLDL Cholesterol Calculated: 18 mg/dL

## 2020-07-13 LAB — FERRITIN: Ferritin: 46.1 ng/mL (ref 15.0–150.0)

## 2020-07-16 ENCOUNTER — Encounter: Attending: Registered Nurse | Primary: Registered Nurse

## 2020-07-26 NOTE — Telephone Encounter (Signed)
Yes it is fine to take one at night

## 2020-07-26 NOTE — Telephone Encounter (Signed)
Please advise

## 2020-07-26 NOTE — Telephone Encounter (Signed)
Received call from daughter, Kendal Hymen. She would ike to know if patient's Effexor could be increased. Kendal Hymen would like to know if patient can take one at night. Advise

## 2020-07-26 NOTE — Telephone Encounter (Signed)
Bonnie informed

## 2020-09-14 MED ORDER — TRIMETHOPRIM 100 MG PO TABS
100 MG | ORAL_TABLET | Freq: Every day | ORAL | 1 refills | Status: DC
Start: 2020-09-14 — End: 2020-09-16

## 2020-09-14 MED ORDER — VENLAFAXINE HCL 37.5 MG PO TABS
37.5 MG | ORAL_TABLET | Freq: Two times a day (BID) | ORAL | 1 refills | Status: DC
Start: 2020-09-14 — End: 2020-09-16

## 2020-09-14 NOTE — Telephone Encounter (Signed)
Pt daughter requests :     Last office visit 07/12/2020     Last written 09/12/2018     Next office visit scheduled 01/10/2021    Requested Prescriptions     Pending Prescriptions Disp Refills   ??? venlafaxine (EFFEXOR) 37.5 MG tablet 90 tablet      Sig: Take 1 tablet by mouth daily   ??? trimethoprim (TRIMPEX) 100 MG tablet       Sig: Take 1 tablet by mouth daily       Pt daughter Kendal Hymen said that dosage for venlafaxine should be changed to twice daily. Pt daughter asked for 90 day supply. [facility] is correct pharmacy. Contact Kendal Hymen with any questions at [phone]

## 2020-09-16 ENCOUNTER — Encounter

## 2020-09-16 MED ORDER — VENLAFAXINE HCL 37.5 MG PO TABS
37.5 MG | ORAL_TABLET | Freq: Two times a day (BID) | ORAL | 1 refills | Status: DC
Start: 2020-09-16 — End: 2020-09-20

## 2020-09-16 MED ORDER — TRIMETHOPRIM 100 MG PO TABS
100 MG | ORAL_TABLET | Freq: Every day | ORAL | 1 refills | Status: DC
Start: 2020-09-16 — End: 2021-05-03

## 2020-09-16 NOTE — Telephone Encounter (Signed)
pts daughter wants refill sent [facility] in [place].

## 2020-09-16 NOTE — Telephone Encounter (Signed)
Daughter notified

## 2020-09-16 NOTE — Telephone Encounter (Signed)
Pharmacy is needing clarification on sig for VENLAFAXINE due to conflicting directions.     Alternative requested for TRIMETHOPRIM due to unavailability.

## 2020-09-16 NOTE — Telephone Encounter (Signed)
The venlafaxine is 1 tablet twice a day I corrected the significance and resent to the pharmacy.  Find out from the patient or her daughter if they want the trimethoprim to go to an different pharmacy since it is currently unavailable at their mail-in pharmacy.

## 2020-09-17 NOTE — Telephone Encounter (Signed)
Received call from [facility] from [facility] asking for instructions clarification for venlafaxine (EFFEXOR) 37.5 MG tablet. Lynden Ang states that it says "Take 37.5 by mouth daily"     Requesting call, Lynden Ang:     [phone]   Reference#: [id number]

## 2020-09-17 NOTE — Telephone Encounter (Signed)
Clarified, 1 tablet twice daily 180 tabs and 1 refill ordered.

## 2020-09-20 MED ORDER — VENLAFAXINE HCL 37.5 MG PO TABS
37.5 MG | ORAL_TABLET | Freq: Two times a day (BID) | ORAL | 1 refills | Status: DC
Start: 2020-09-20 — End: 2020-09-23

## 2020-09-20 NOTE — Telephone Encounter (Signed)
Let her know the Effexor with the correct instructions was sent to [facility], I have resent it to [facility].

## 2020-09-20 NOTE — Telephone Encounter (Signed)
[name] pt informed

## 2020-09-23 MED ORDER — VENLAFAXINE HCL ER 37.5 MG PO CP24
37.5 MG | ORAL_CAPSULE | Freq: Two times a day (BID) | ORAL | 0 refills | Status: DC
Start: 2020-09-23 — End: 2021-02-22

## 2020-09-23 MED ORDER — VENLAFAXINE HCL ER 37.5 MG PO CP24
37.5 MG | ORAL_CAPSULE | Freq: Two times a day (BID) | ORAL | 1 refills | Status: DC
Start: 2020-09-23 — End: 2020-09-23

## 2020-09-23 NOTE — Telephone Encounter (Signed)
Please advise

## 2020-09-23 NOTE — Telephone Encounter (Signed)
Prescription [facility] to [facility].

## 2020-09-23 NOTE — Telephone Encounter (Signed)
Lauren Bryant had wanted the rx to go to [facility] but states she would be okay letting the [facility] one go through and just sending a 5 day supply to [facility].

## 2020-09-23 NOTE — Telephone Encounter (Signed)
Pt's daughter called and would like pts RX of Venlafaxine to be changed to Venlafaxine HCL ER caps 37.5 1 twice a day 180 tabs, that's what she was taking before from her previous PCP. Consulted the pharmacist and was made aware that they are different.

## 2020-09-28 NOTE — Telephone Encounter (Signed)
Daughter is requesting a Handicap placard for patient.  Patient is having trouble walking in to offices for her appointments.    Please contact daughter when letter is ready to pick up.    Call [place] @  [phone]

## 2020-09-28 NOTE — Telephone Encounter (Signed)
[name] for pick up

## 2020-10-12 ENCOUNTER — Encounter: Admit: 2020-10-12 | Discharge: 2020-10-12 | Payer: MEDICARE | Primary: Registered Nurse

## 2020-10-12 ENCOUNTER — Encounter: Primary: Registered Nurse

## 2020-10-12 DIAGNOSIS — Z23 Encounter for immunization: Secondary | ICD-10-CM

## 2020-10-12 NOTE — Telephone Encounter (Signed)
Pt did come into office for Flu.

## 2020-10-12 NOTE — Telephone Encounter (Signed)
Patients daughter is requesting a CB regarding the flu shot. She had to cancel 10/19 appointment.     Please advise

## 2020-10-27 IMAGING — CT CT HEAD W/O CM
3 series · 14 of 47 positions shown, 16 images · non-contrast
Comparison: None.

CLINICAL DATA: Fell, back pain, hit head

EXAM:
CT HEAD WITHOUT CONTRAST
TECHNIQUE: Contiguous axial images were obtained from the base of the skull
through the vertex without intravenous contrast.

[Series 2: head wo · axial · 0.46mm/px · z∈[-159,-34]mm · 8 of 31 slices shown, 10 images]
[im 3/31  brain]
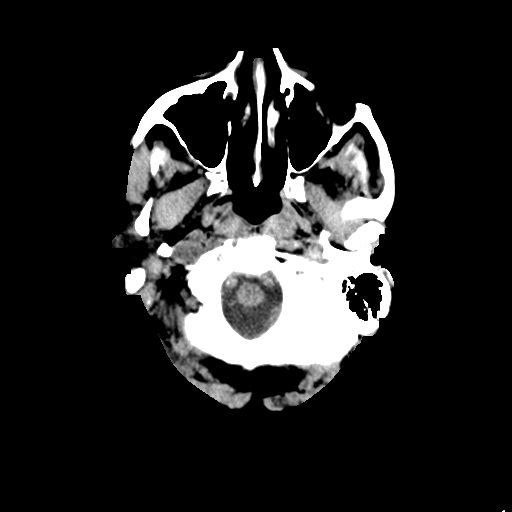
[im 3/31  bone]
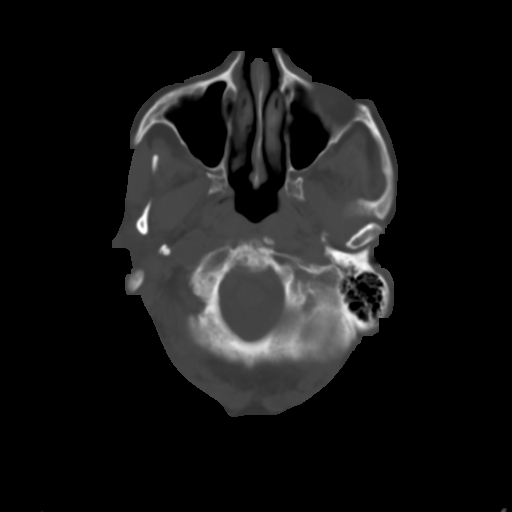
[im 7/31  brain]
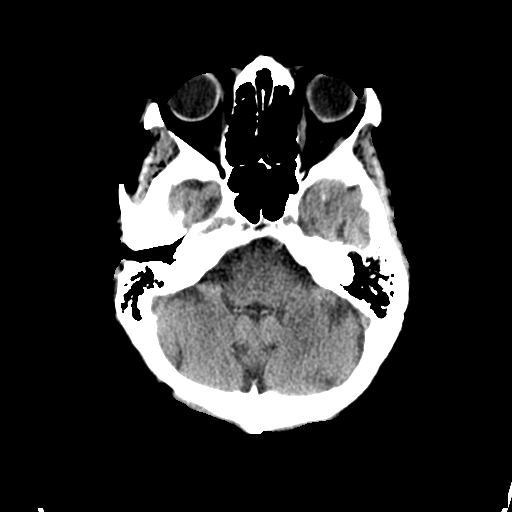
[im 10/31  brain]
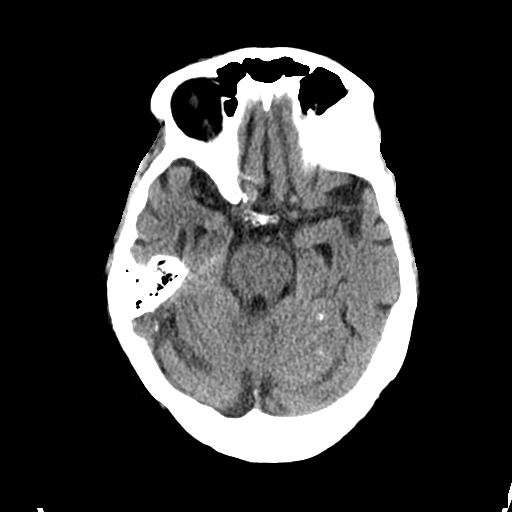
[im 14/31  brain]
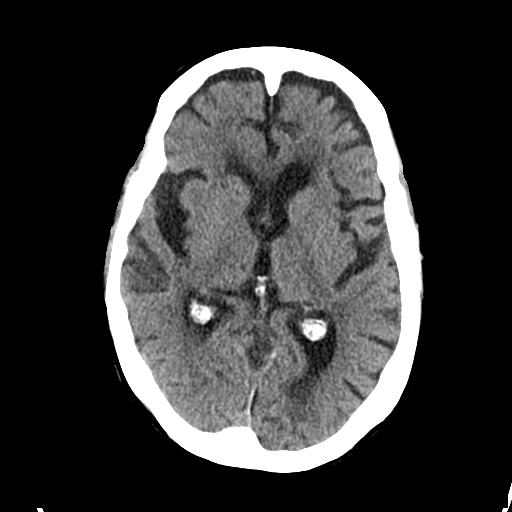
[im 17/31  brain]
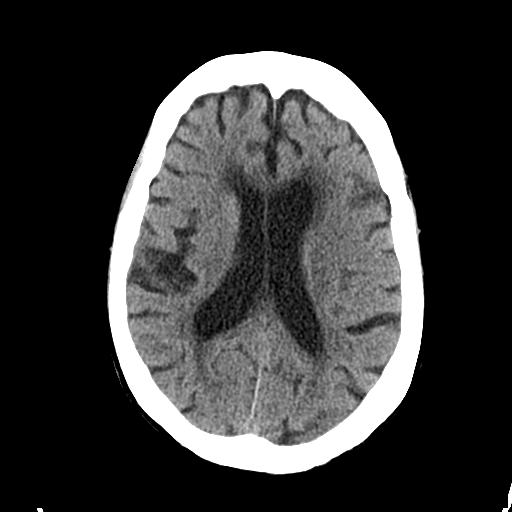
[im 17/31  bone]
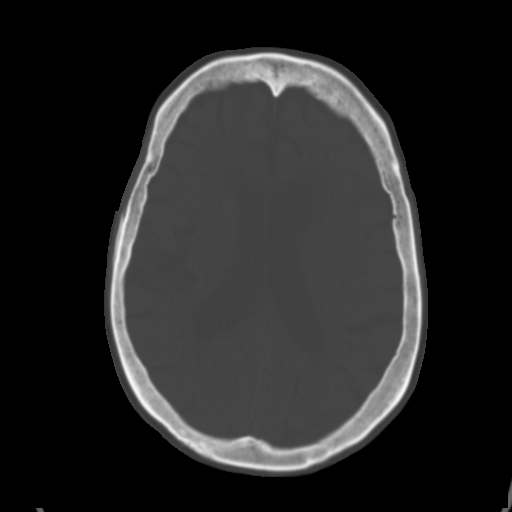
[im 21/31  brain]
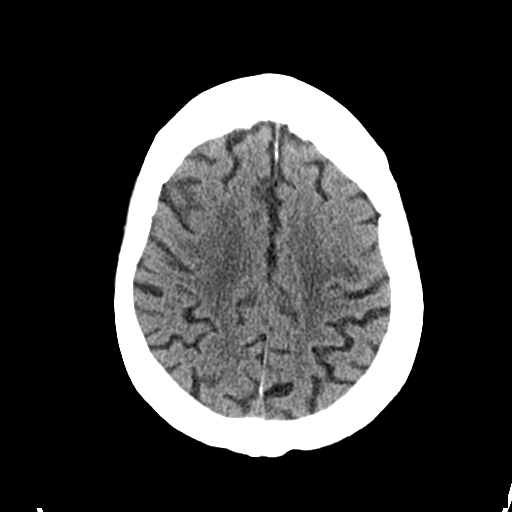
[im 24/31  brain]
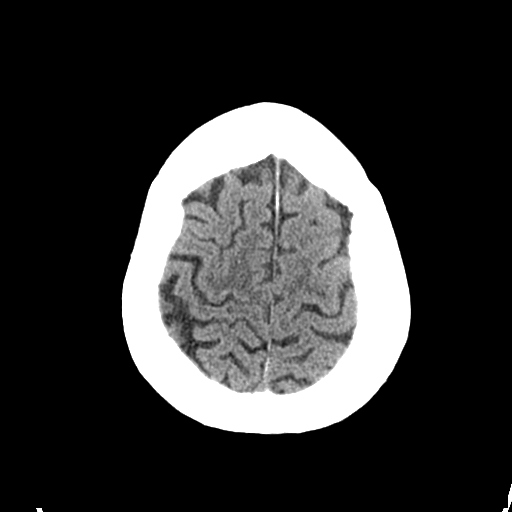
[im 28/31  brain]
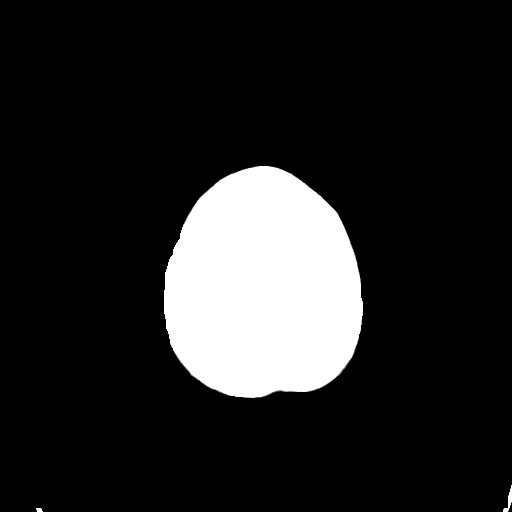

[Series 4: coronal soft tissue · coronal · 0.30mm/px · 3 of 64 slices shown]
[im 22/64  brain]
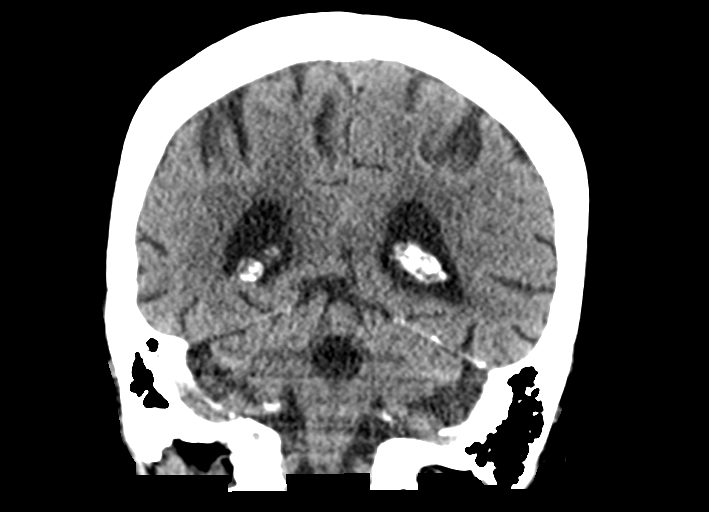
[im 29/64  brain]
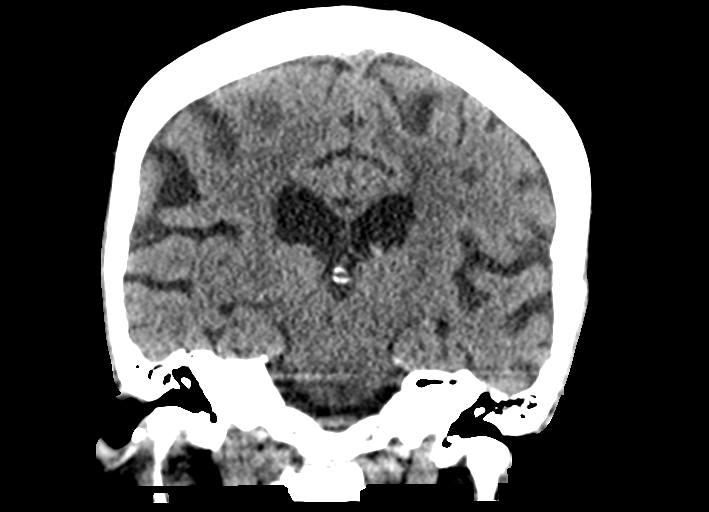
[im 36/64  brain]
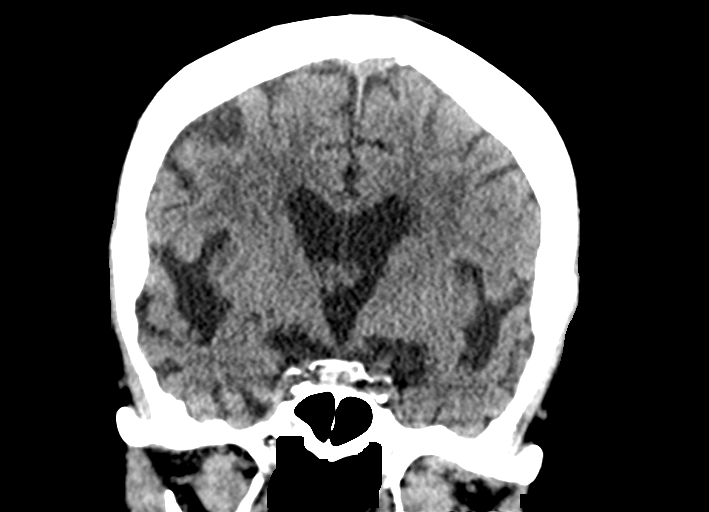

[Series 5: sagittal soft tissue · sagittal · 0.32mm/px · 3 of 51 slices shown]
[im 17/51  brain]
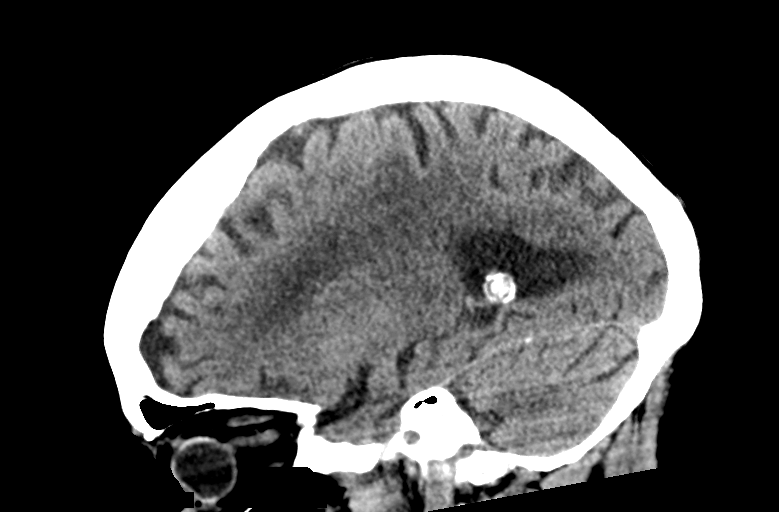
[im 26/51  brain]
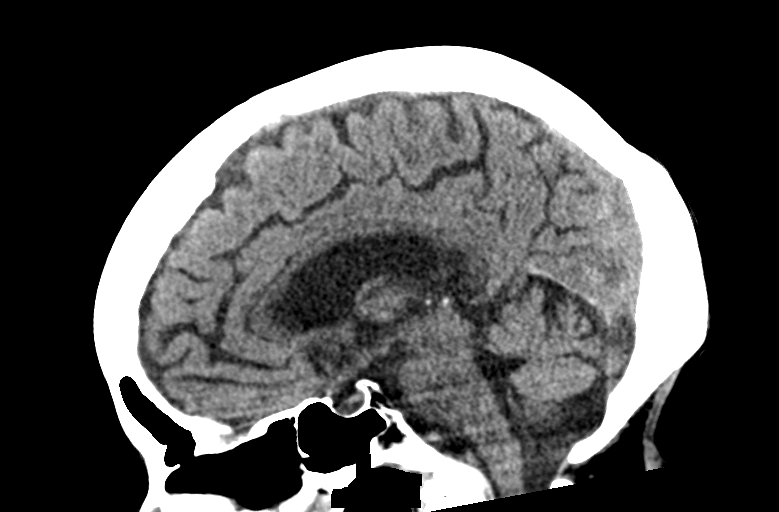
[im 34/51  brain]
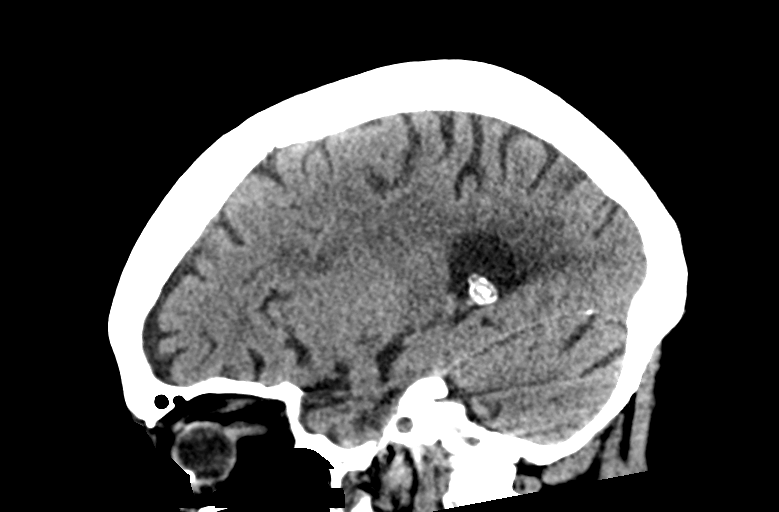

[14 of 47 positions shown; findings below may reference images not displayed]

FINDINGS: Brain: No evidence of acute infarct or hemorrhage. Confluent
hypodensities throughout the periventricular and subcortical white
matter consistent with chronic small vessel ischemic changes. The
lateral ventricles and midline structures are unremarkable. No acute
extra-axial fluid collections. No mass effect.

Vascular: No hyperdense vessel or unexpected calcification.

Skull: Normal. Negative for fracture or focal lesion.

Sinuses/Orbits: No acute finding.

Other: None
IMPRESSION: 1. Chronic small-vessel ischemic changes throughout the white
matter. No acute intracranial trauma.

## 2020-10-27 IMAGING — CT CT CERVICAL SPINE W/O CM
3 of 4 series · 12 of 33 positions shown, 14 images · non-contrast
Comparison: None.

CLINICAL DATA: Fell, back pain

EXAM:
CT CERVICAL SPINE WITHOUT CONTRAST
TECHNIQUE: Multidetector CT imaging of the cervical spine was performed without
intravenous contrast. Multiplanar CT image reconstructions were also
generated.

[Series 6: sagittal bone · sagittal · 0.28mm/px · 5 of 52 slices shown, 6 images]
[im 18/52  bone]
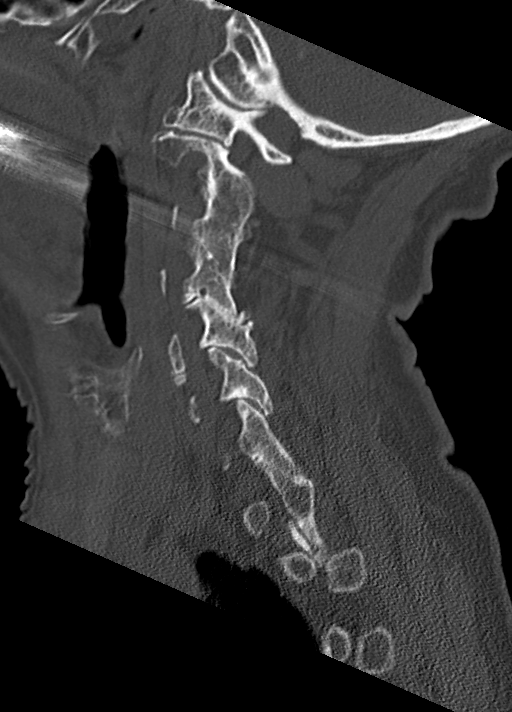
[im 22/52  bone]
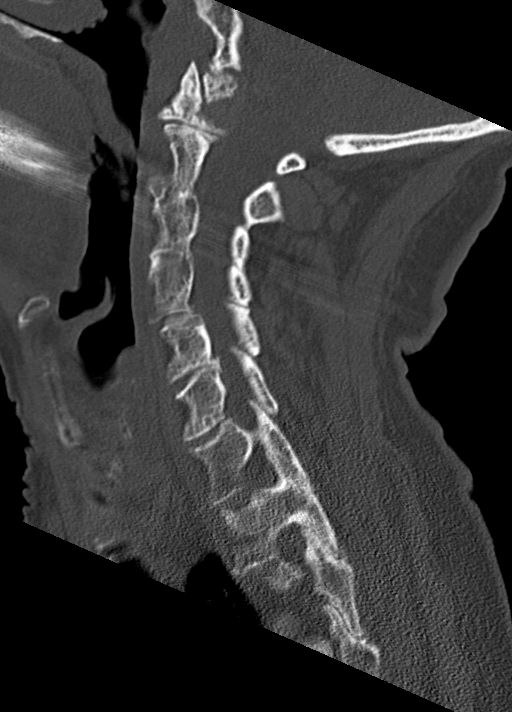
[im 26/52  soft-tissue]
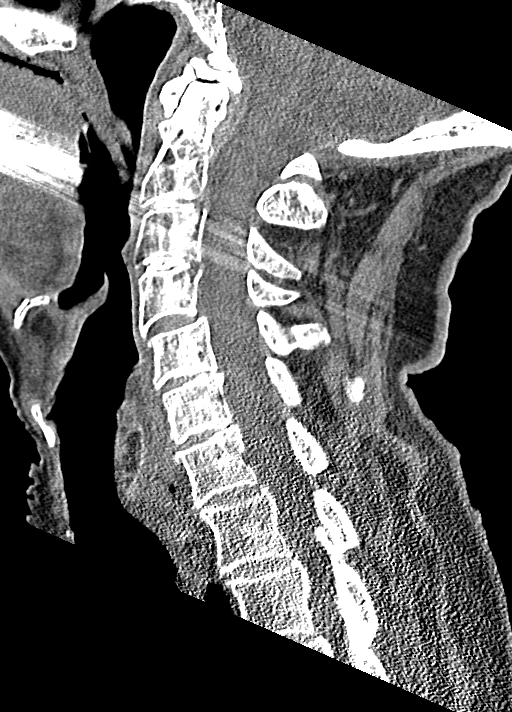
[im 26/52  bone]
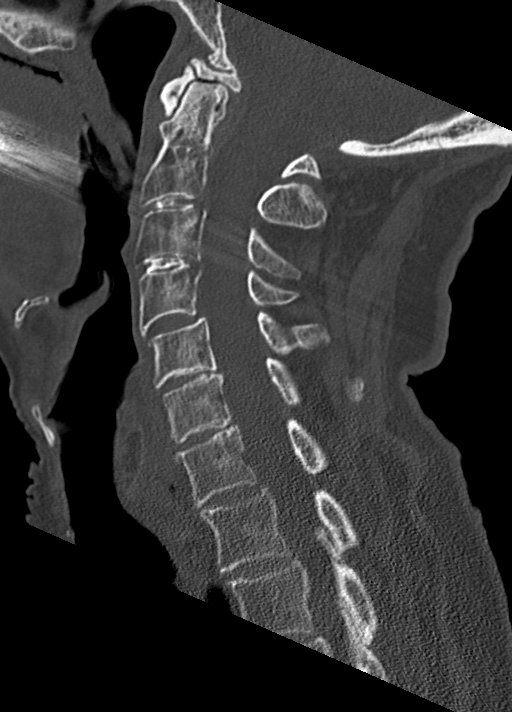
[im 30/52  bone]
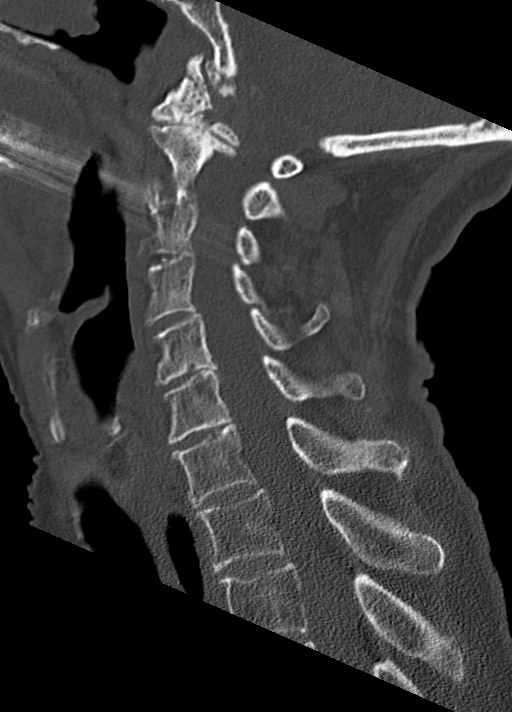
[im 35/52  bone]
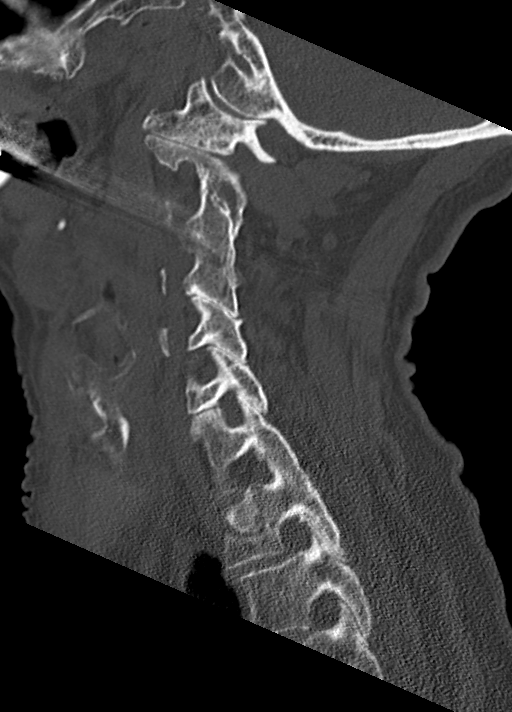

[Series 7: coronal bone · coronal · 0.23mm/px · 3 of 61 slices shown]
[im 17/61  bone]
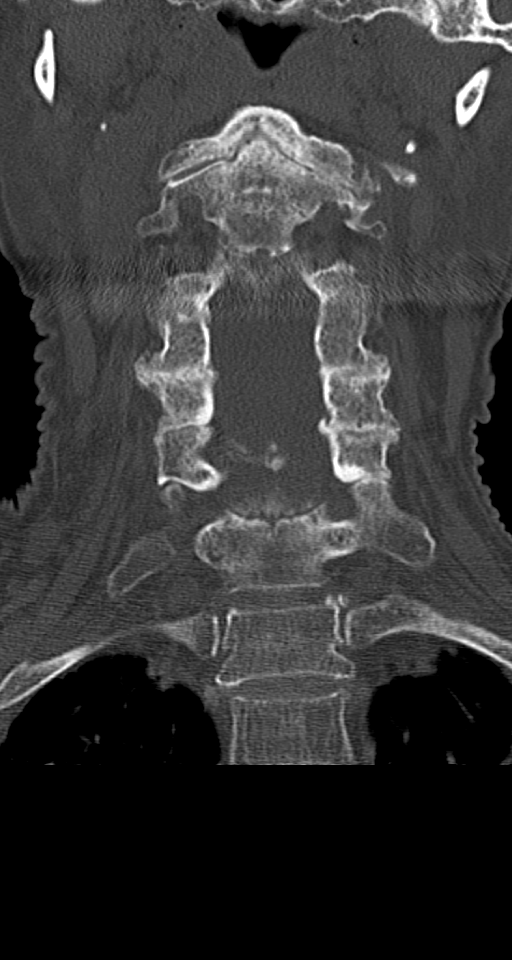
[im 26/61  bone]
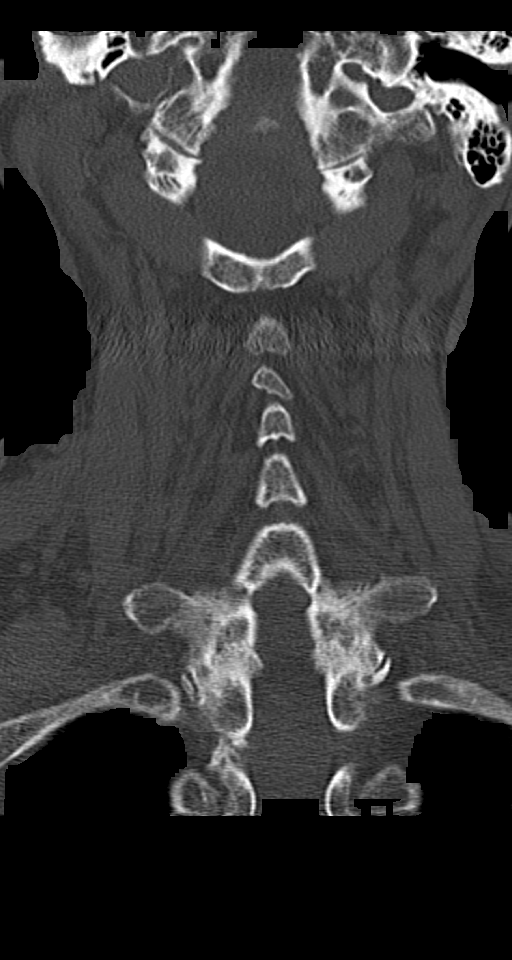
[im 35/61  bone]
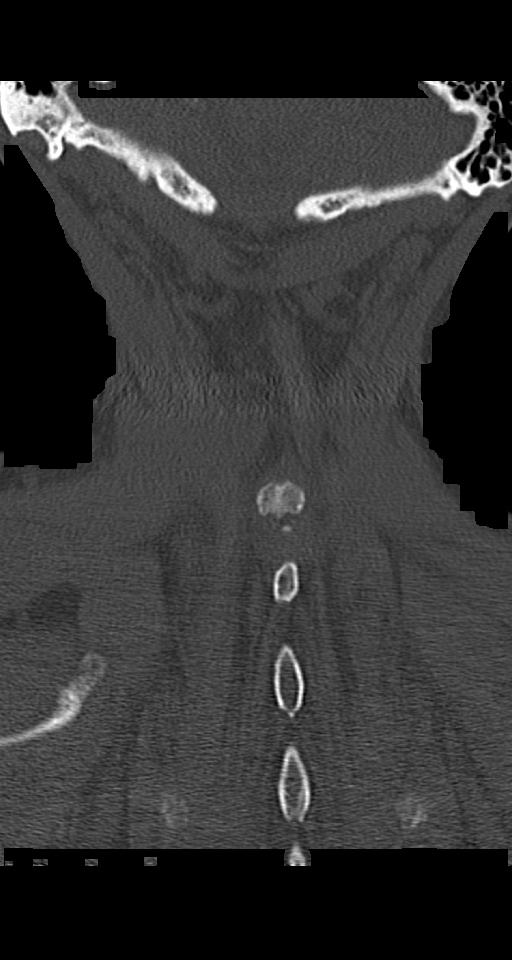

[Series 8: orthogonal bone · axial · 0.23mm/px · z∈[-329,-213]mm · 4 of 91 slices shown, 5 images]
[im 13/91  soft-tissue]
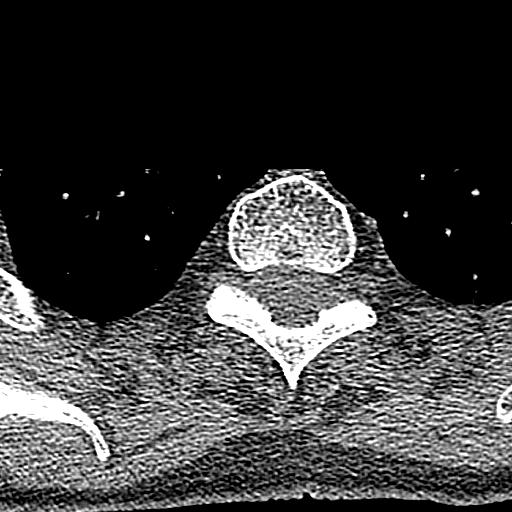
[im 13/91  bone]
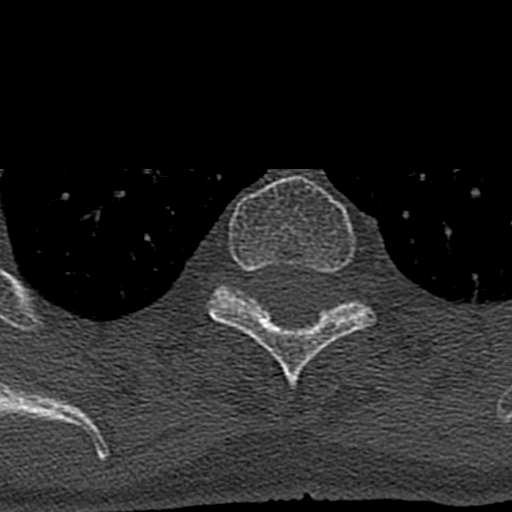
[im 39/91  bone]
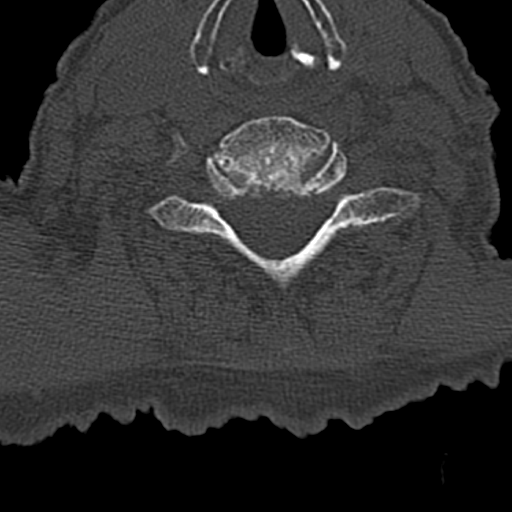
[im 52/91  bone]
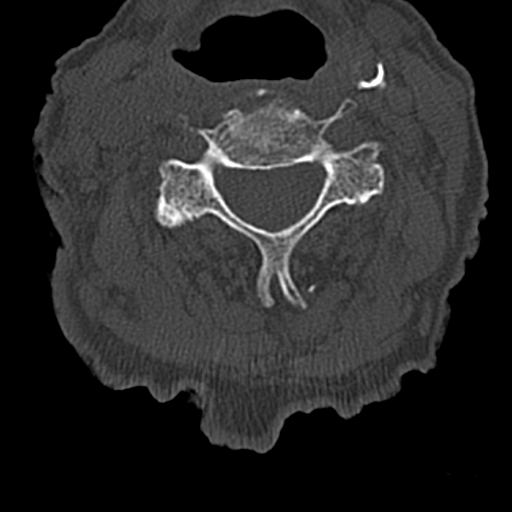
[im 78/91  bone]
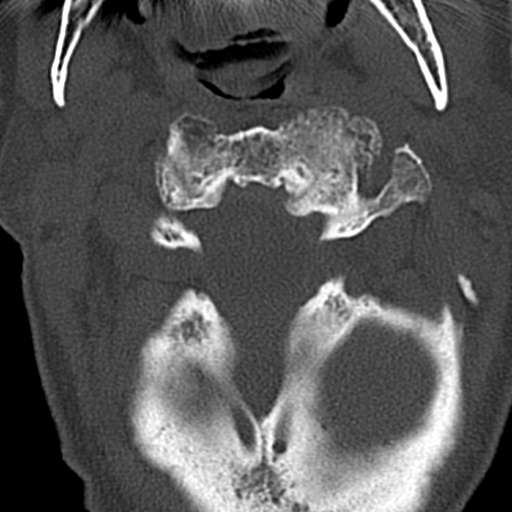

[12 of 33 positions shown; findings below may reference images not displayed]

FINDINGS: Alignment: Cervical vertebral bodies are in grossly anatomic
alignment.

Skull base and vertebrae: No acute displaced fractures.

Soft tissues and spinal canal: No prevertebral fluid or swelling. No
visible canal hematoma.

Disc levels: There is extensive multilevel spondylosis and facet
hypertrophy. There is bony fusion across the facet joints from C2
through C4 bilateral and at C7/T1 bilaterally. Prominent
hypertrophic changes are seen at the craniocervical junction and
C1/C2 interface.

Upper chest: Airway is patent.  Lung apices are clear.

Other: Reconstructed images demonstrate no additional findings.
IMPRESSION: 1. No acute cervical spine fracture.
2. Extensive multilevel cervical spondylosis and facet hypertrophy.

## 2020-10-29 IMAGING — CR DG ABDOMEN 1V
1 series · 1 of 1 positions shown · non-contrast
Comparison: None.

CLINICAL DATA: Constipation

EXAM:
ABDOMEN - 1 VIEW

[abdomen kub]
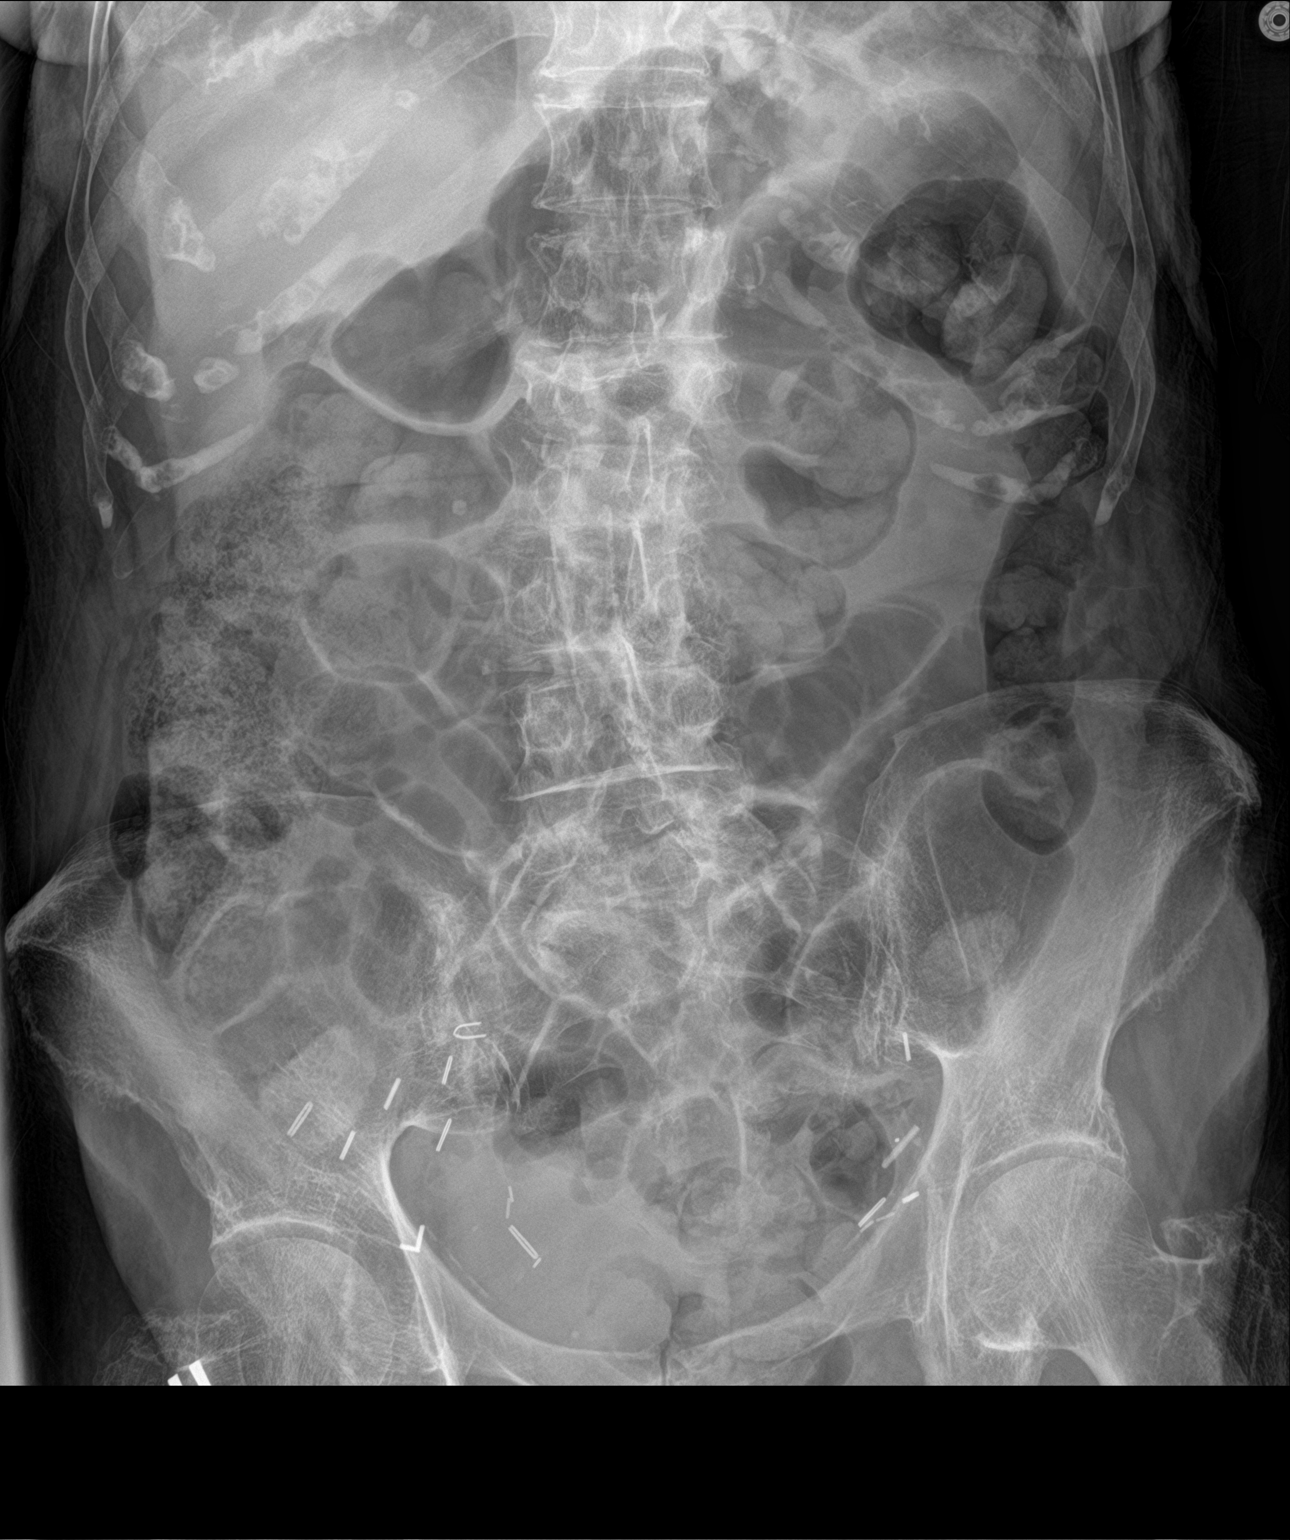

[1 of 1 positions shown; findings below may reference images not displayed]

FINDINGS: Air-filled loops of colon and small bowel. Stool burden is mild.
Surgical clips overlie the pelvis. Degenerative changes of the
lumbar spine. Compression deformities better evaluated on recent CT
imaging.
IMPRESSION: Mild stool burden.

## 2021-01-10 ENCOUNTER — Ambulatory Visit: Admit: 2021-01-10 | Discharge: 2021-01-10 | Payer: MEDICARE | Attending: Registered Nurse | Primary: Registered Nurse

## 2021-01-10 DIAGNOSIS — W19XXXA Unspecified fall, initial encounter: Secondary | ICD-10-CM

## 2021-01-10 MED ORDER — ALPRAZOLAM 0.25 MG PO TABS
0.25 MG | ORAL_TABLET | Freq: Three times a day (TID) | ORAL | 3 refills | Status: AC | PRN
Start: 2021-01-10 — End: 2021-02-09

## 2021-01-10 NOTE — Progress Notes (Signed)
Patient: Lauren Bryant is a 87 y.o. female who presents today with the following Chief Complaint(s):  Chief Complaint   Patient presents with   ??? 6 Month Follow-Up       Assessment:  Encounter Diagnoses   Name Primary?   ??? Fall, initial encounter Yes   ??? Rib pain on left side    ??? Constipation, unspecified constipation type    ??? Anxiety        Plan:  1. Fall, initial encounter  Doing well today, still having rib pain see below    2. Rib pain on left side  No need for xray. It would not change treatment.  Likely just sore from fall. No bruising or redness noted. Continue with Ibuprofen as needed    3. Constipation, unspecified constipation type  Can take colace BID and follow up if no improvement    4. Anxiety  Stable, continue xanax and effexor. Xanax is just as needed.   - ALPRAZolam (XANAX) 0.25 MG tablet; Take 1 tablet by mouth 3 times daily as needed for Sleep or Anxiety for up to 30 days.  Dispense: 30 tablet; Refill: 3      HPI   Patient presents today for a 6 month follow up. She states she is sleeping better and not having nightmares currently.   Her daughter report she fell about 10 days ago. She fell on her left side and has been having pain on her left rib cage. She has been taking Ibuprofen as needed and it helps with the pain. Patient was concerned it was her heart.    she does need a refill of her xanax, she uses it sparingly.     Current Outpatient Medications   Medication Sig Dispense Refill   ??? ALPRAZolam (XANAX) 0.25 MG tablet Take 1 tablet by mouth 3 times daily as needed for Sleep or Anxiety for up to 30 days. 30 tablet 3   ??? venlafaxine (EFFEXOR XR) 37.5 MG extended release capsule Take 1 capsule by mouth 2 times daily for 5 days 10 capsule 0   ??? trimethoprim (TRIMPEX) 100 MG tablet Take 1 tablet by mouth daily 90 tablet 1   ??? simvastatin (ZOCOR) 20 MG tablet Take 1 tablet by mouth nightly 90 tablet 1   ??? docusate sodium (STOOL SOFTENER) 100 MG capsule Take 100 mg by mouth 2 times daily     ???  ferrous sulfate 325 (65 Fe) MG tablet Take 325 mg by mouth daily (with breakfast)     ??? tolterodine (DETROL LA) 4 MG ER capsule Take 4 mg by mouth daily     ??? aspirin 81 MG tablet Take 81 mg by mouth daily       No current facility-administered medications for this visit.       Patient's past medical history, surgical history, family history, medications,and allergies  were all reviewed and updated as appropriate today.      Review of Systems   Constitutional: Negative.    HENT: Negative.    Respiratory: Negative.         Left sided rib pain   Cardiovascular: Negative.    Gastrointestinal: Negative.    Musculoskeletal: Negative.    Skin: Negative.    Neurological: Negative.  Negative for dizziness and headaches.   Psychiatric/Behavioral: Negative.          Physical Exam  Vitals reviewed.   Cardiovascular:      Rate and Rhythm: Normal rate and regular rhythm.  Heart sounds: Normal heart sounds.   Pulmonary:      Effort: Pulmonary effort is normal.      Breath sounds: Normal breath sounds.   Chest:       Skin:     General: Skin is warm and dry.   Neurological:      Mental Status: She is alert and oriented to person, place, and time.       Vitals:    01/10/21 1252   BP: 106/60   Temp: 97 ??F (36.1 ??C)       This chart was generated using the Dragon dictation system.  I created this record but it may contain dictation errors due to the limitation of the software.

## 2021-01-24 ENCOUNTER — Encounter

## 2021-01-24 MED ORDER — SIMVASTATIN 20 MG PO TABS
20 MG | ORAL_TABLET | Freq: Every evening | ORAL | 1 refills | Status: DC
Start: 2021-01-24 — End: 2021-01-25

## 2021-01-24 NOTE — Telephone Encounter (Signed)
Requesting Refill  simvastatin (ZOCOR) 20 MG tablet     Last Office Visit  -  01/10/21  Next Office Visit  -  07/11/21  Last Filled  -    Last UDS -    Contract -

## 2021-01-25 MED ORDER — SIMVASTATIN 20 MG PO TABS
20 MG | ORAL_TABLET | Freq: Every evening | ORAL | 1 refills | Status: DC
Start: 2021-01-25 — End: 2021-05-02

## 2021-01-25 NOTE — Telephone Encounter (Signed)
Call from daughter stating medication was sent to wrong pharmacy.    Please resend to [facility].

## 2021-01-25 NOTE — Addendum Note (Signed)
Addended by: Barbette Merino on: 01/25/2021 03:18 PM     Modules accepted: Orders

## 2021-02-22 MED ORDER — VENLAFAXINE HCL ER 37.5 MG PO CP24
37.5 MG | ORAL_CAPSULE | ORAL | 3 refills | Status: DC
Start: 2021-02-22 — End: 2021-04-06

## 2021-02-22 NOTE — Telephone Encounter (Signed)
Last Office Visit  -  01/10/21  Next Office Visit  -  07/11/21    Last Filled  -    Last UDS -    Contract -

## 2021-04-06 MED ORDER — VENLAFAXINE HCL ER 37.5 MG PO CP24
37.5 MG | ORAL_CAPSULE | ORAL | 3 refills | Status: DC
Start: 2021-04-06 — End: 2021-10-13

## 2021-04-06 NOTE — Telephone Encounter (Signed)
Received call from Pt's daughter requesting venlafaxine (EFFEXOR XR) 37.5 MG extended release capsule    Last Office Visit  -  01/10/21  Next Office Visit  -  07/11/21

## 2021-04-12 NOTE — Telephone Encounter (Signed)
Appt already made for 07/11/21

## 2021-04-12 NOTE — Telephone Encounter (Signed)
[name] pt to schedule MWV for either 4/25 or 04/19/21

## 2021-05-02 ENCOUNTER — Telehealth

## 2021-05-02 MED ORDER — SIMVASTATIN 20 MG PO TABS
20 MG | ORAL_TABLET | Freq: Every evening | ORAL | 0 refills | Status: AC
Start: 2021-05-02 — End: 2023-11-07

## 2021-05-02 MED ORDER — SIMVASTATIN 20 MG PO TABS
20 MG | ORAL_TABLET | Freq: Every evening | ORAL | 1 refills | Status: DC
Start: 2021-05-02 — End: 2021-06-14

## 2021-05-02 NOTE — Telephone Encounter (Signed)
Refills for   simvastatin (ZOCOR) 20 MG tablet    Patient only has 2 pills left. Daughter thought [facility] was sending medication but when she called they said the RX is expired. Can she pick up a RX for like 14 days at our office and the the rest sent to [facility].   She also is requesting   trimethoprim (TRIMPEX) 100 MG tablet  90 Day supply [facility].  Please call the daughter when the RX for 14 days is ready she will pick up.  Last seen 01/10/21 EG  Future 07/11/21 EG

## 2021-05-02 NOTE — Telephone Encounter (Signed)
Prescriptions sent

## 2021-05-02 NOTE — Telephone Encounter (Signed)
Pended for your approval.

## 2021-05-03 MED ORDER — TRIMETHOPRIM 100 MG PO TABS
100 MG | ORAL_TABLET | Freq: Every day | ORAL | 1 refills | Status: AC
Start: 2021-05-03 — End: 2023-11-07

## 2021-05-03 MED ORDER — TRIMETHOPRIM 100 MG PO TABS
100 | ORAL_TABLET | Freq: Every day | ORAL | 0 refills | Status: DC
Start: 2021-05-03 — End: 2021-06-14

## 2021-05-03 NOTE — Addendum Note (Signed)
Addended by: [name] on: 05/03/2021 10:49 AM     Modules accepted: Orders

## 2021-05-03 NOTE — Telephone Encounter (Signed)
Pt mother informed and will pick up at front desk this afternoon.

## 2021-05-03 NOTE — Telephone Encounter (Signed)
Script sent for 90 days. Please let daughter know she can pick up the 14 day prescription as well. Let her know the new location address.

## 2021-05-03 NOTE — Telephone Encounter (Signed)
Please advise?

## 2021-05-03 NOTE — Telephone Encounter (Signed)
1.Pt daughter called stating she needs 14 day supply of trimethoprim (TRIMPEX) 100 MG tablet as a written rx to pick up at office. Please contact pt daughter when ready at [phone].     2. Pt would also like trimethoprim (TRIMPEX) 100 MG tablet [facility] for [facility] day supply.

## 2021-05-06 ENCOUNTER — Telehealth

## 2021-05-06 NOTE — Telephone Encounter (Signed)
Patient's daughter spoke with Hospice and they said they can provide palliative care for the patient, But first her primary care physician needs  to call for a consultation before they will grant care.  [facility].  [phone]

## 2021-05-09 NOTE — Telephone Encounter (Signed)
Referral placed, please fax. If at home hospice care is needed full time then I prefer to defer to hospice provider.

## 2021-05-09 NOTE — Telephone Encounter (Signed)
Referral faxed to [phone]

## 2021-06-13 NOTE — Telephone Encounter (Signed)
Pt's daughter states pt is feeling better since call started. Main concern is dizziness. Scheduled for tomorrow at 3:20 with Emilie. If any concerns or questions, contact pt at [phone].

## 2021-06-13 NOTE — Telephone Encounter (Signed)
Received call from [facility] at [place] with [employer] Complaint.    Limited triage, caller not with patient during this time.   [facility] at [facility] during [facility]age and was     Subjective: Caller states "Patient weakness, dizziness, shaking, decreased appetite/fluid intake.Caller states, patient has said, "I wish you could give me a pill and I not wake up. Last BM 06/12/2021, ate < half of her lunch"    BP - 105/54 - per caller taken today by aid    Current Symptoms: dizziness (chronic) worse with change in position, weakness (general decline), reduced appetite/fluid intake, shakey    Onset: 1 week ago; gradual    Associated Symptoms: reduced activity, reduced appetite, reduced fluid intake, increased sleepiness    Pain Severity: Caller denies patient in pain    Sitting BP's  1545 - Left arm BP - 99/54; HR - 79  1550 - Right arm BP - 100/57; HR -78; 02 - 96%    Temperature: 97.6 Denies fever, chills, or sweats.    What has been tried: Increase fluids; ensure, milk, encourage her to eat and move around    LMP: NA Pregnant: NA    Recommended disposition: Go to ED/[facility] Now (Or to Office with PCP Approval)    Care advice provided, patient verbalizes understanding; denies any other questions or concerns; instructed to call back for any new or worsening symptoms.    Writer provided warm transfer to [place] at [facility] for 2nd level triage/provider disposition     Attention Provider:  Thank you for allowing me to participate in the care of your patient.  The patient was connected to triage in response to information provided to the ECC/PSC.  Please do not respond through this encounter as the response is not directed to a shared pool.    Reason for Disposition  ??? Drinking very little and has signs of dehydration (e.g., no urine > 12 hours, very dry mouth, very lightheaded)    Protocols used: DIZZINESS-ADULT-OH

## 2021-06-13 NOTE — Telephone Encounter (Signed)
[name] notifying pt

## 2021-06-13 NOTE — Telephone Encounter (Signed)
Will see at appointment and can go to the ER with worsening symptoms.

## 2021-06-14 ENCOUNTER — Ambulatory Visit: Admit: 2021-06-14 | Payer: MEDICARE | Attending: Registered Nurse | Primary: Registered Nurse

## 2021-06-14 DIAGNOSIS — R42 Dizziness and giddiness: Secondary | ICD-10-CM

## 2021-06-14 NOTE — Progress Notes (Signed)
Patient: Lauren Bryant is a 87 y.o. female who presents today with the following Chief Complaint(s):  Chief Complaint   Patient presents with   ??? Dizziness       Assessment:  Encounter Diagnoses   Name Primary?   ??? Dizziness Yes   ??? Cardiac murmur due to mitral valve disorder    ??? Positive depression screening        Plan:  1. Dizziness  Needs to increase water and food intake. Will check echo to further evaluate Heart murmur.   - Echocardiogram complete; Future    2. Cardiac murmur due to mitral valve disorder  Will further evaluate.   - Echocardiogram complete; Future    3. Positive depression screening  See below. Will try taking the effexor 2 tablets once a day and follow up in 2 weeks if no improvement may increase dosage to 150 daily.       HPI   Patient presents today to follow-up on dizziness.  Patient's daughter states that has been going on for several years but seems to be worsening in the past couple of weeks.  She states that today actually seems to be a better day.  Patient's daughter states on those dates works were she does not seem to be functioning much on those days.  Patient reports the dizziness to be more like a passing out feeling but she does not lose any consciousness.  She denies any spinning sensation.  She has not been eating and drinking as much and her daughter has been encouraging her to intake more.  Her daughter is concerned about some worsening depression.  Past couple of weeks- not function much on some days due to the dizziness.  PHQ-9 today is 7.  Her daughter would like to maybe try changing her antidepressant to see if that helps as well.      Current Outpatient Medications   Medication Sig Dispense Refill   ??? trimethoprim (TRIMPEX) 100 MG tablet Take 1 tablet by mouth daily 90 tablet 1   ??? simvastatin (ZOCOR) 20 MG tablet Take 1 tablet by mouth nightly 14 tablet 0   ??? venlafaxine (EFFEXOR XR) 37.5 MG extended release capsule TAKE 1 CAPSULE TWICE A DAY 180 capsule 3   ??? docusate  sodium (STOOL SOFTENER) 100 MG capsule Take 100 mg by mouth 2 times daily     ??? ferrous sulfate 325 (65 Fe) MG tablet Take 325 mg by mouth daily (with breakfast)     ??? aspirin 81 MG tablet Take 81 mg by mouth daily       No current facility-administered medications for this visit.       Patient's past medical history, surgical history, family history, medications,and allergies  were all reviewed and updated as appropriate today.      Review of Systems   Constitutional: Positive for fatigue.   HENT: Negative.    Neurological: Positive for dizziness. Negative for headaches.         Physical Exam  Vitals reviewed.   Cardiovascular:      Rate and Rhythm: Normal rate and regular rhythm.      Heart sounds: Normal heart sounds.   Pulmonary:      Effort: Pulmonary effort is normal.      Breath sounds: Normal breath sounds.   Skin:     General: Skin is warm and dry.   Neurological:      Mental Status: She is alert and oriented to person, place, and  time.       Vitals:    06/14/21 1530   BP: (!) 100/50   Pulse: 81   SpO2: 98%       This chart was generated using the Dragon dictation system.  I created this record but it may contain dictation errors due to the limitation of the software.  PHQ-9 score today: (PHQ-9 Total Score: 7), additional evaluation and assessment performed, follow-up plan includes but not limited to: Medication management and Referral to BH/Specialist  for evaluation and management.

## 2021-06-25 ENCOUNTER — Inpatient Hospital Stay: Admit: 2021-06-25 | Discharge: 2021-06-25 | Disposition: A | Discharge status: Home / Self Care | Payer: MEDICARE

## 2021-06-25 ENCOUNTER — Emergency Department: Admit: 2021-06-25 | Payer: MEDICARE | Primary: Registered Nurse

## 2021-06-25 DIAGNOSIS — M545 Low back pain, unspecified: Secondary | ICD-10-CM

## 2021-06-25 LAB — URINALYSIS WITH REFLEX TO CULTURE
Bilirubin Urine: NEGATIVE
Glucose, Ur: NEGATIVE mg/dL
Ketones, Urine: NEGATIVE mg/dL
Nitrite, Urine: POSITIVE — AB
Protein, UA: NEGATIVE mg/dL
Specific Gravity, UA: 1.015 (ref 1.005–1.030)
Urobilinogen, Urine: 0.2 E.U./dL (ref <2.0)
pH, UA: 7 (ref 5.0–8.0)

## 2021-06-25 LAB — CBC WITH AUTO DIFFERENTIAL
Basophils %: 0.5 %
Basophils Absolute: 0 10*3/uL (ref 0.0–0.2)
Eosinophils %: 0.8 %
Eosinophils Absolute: 0.1 10*3/uL (ref 0.0–0.6)
Hematocrit: 26.8 % — ABNORMAL LOW (ref 36.0–48.0)
Hemoglobin: 8.6 g/dL — ABNORMAL LOW (ref 12.0–16.0)
Lymphocytes %: 6.2 %
Lymphocytes Absolute: 0.5 10*3/uL — ABNORMAL LOW (ref 1.0–5.1)
MCH: 29.5 pg (ref 26.0–34.0)
MCHC: 32.1 g/dL (ref 31.0–36.0)
MCV: 91.8 fL (ref 80.0–100.0)
MPV: 11.3 fL — ABNORMAL HIGH (ref 5.0–10.5)
Monocytes %: 6.9 %
Monocytes Absolute: 0.5 10*3/uL (ref 0.0–1.3)
Neutrophils %: 85.6 %
Neutrophils Absolute: 6.5 10*3/uL (ref 1.7–7.7)
Platelets: 164 10*3/uL (ref 135–450)
RBC: 2.92 M/uL — ABNORMAL LOW (ref 4.00–5.20)
RDW: 16.5 % — ABNORMAL HIGH (ref 12.4–15.4)
WBC: 7.6 10*3/uL (ref 4.0–11.0)

## 2021-06-25 LAB — EKG 12-LEAD
Atrial Rate: 78 {beats}/min
P Axis: 52 degrees
P-R Interval: 146 ms
Q-T Interval: 414 ms
QRS Duration: 108 ms
QTc Calculation (Bazett): 471 ms
R Axis: 85 degrees
T Axis: 35 degrees
Ventricular Rate: 78 {beats}/min

## 2021-06-25 LAB — COMPREHENSIVE METABOLIC PANEL W/ REFLEX TO MG FOR LOW K
ALT: 12 U/L (ref 10–40)
AST: 21 U/L (ref 15–37)
Albumin/Globulin Ratio: 2.3 — ABNORMAL HIGH (ref 1.1–2.2)
Albumin: 4.2 g/dL (ref 3.4–5.0)
Alkaline Phosphatase: 35 U/L — ABNORMAL LOW (ref 40–129)
Anion Gap: 9 (ref 3–16)
BUN: 14 mg/dL (ref 7–20)
CO2: 27 mmol/L (ref 21–32)
Calcium: 9.2 mg/dL (ref 8.3–10.6)
Chloride: 102 mmol/L (ref 99–110)
Creatinine: 0.8 mg/dL (ref 0.6–1.2)
GFR African American: >60 (ref >60)
GFR Non-African American: >60 (ref >60)
Glucose: 102 mg/dL — ABNORMAL HIGH (ref 70–99)
Potassium reflex Magnesium: 4.3 mmol/L (ref 3.5–5.1)
Sodium: 138 mmol/L (ref 136–145)
Total Bilirubin: 0.5 mg/dL (ref 0.0–1.0)
Total Protein: 6 g/dL — ABNORMAL LOW (ref 6.4–8.2)

## 2021-06-25 LAB — MICROSCOPIC URINALYSIS

## 2021-06-25 LAB — TROPONIN: Troponin: <0.01 ng/mL (ref <0.01)

## 2021-06-25 MED ORDER — CEFUROXIME AXETIL 500 MG PO TABS
500 MG | ORAL_TABLET | Freq: Two times a day (BID) | ORAL | 0 refills | Status: DC
Start: 2021-06-25 — End: 2021-06-25

## 2021-06-25 MED ORDER — OXYCODONE-ACETAMINOPHEN 5-325 MG PO TABS
5-325 MG | Freq: Once | ORAL | Status: AC
Start: 2021-06-25 — End: 2021-06-25
  Administered 2021-06-25: 16:00:00 1 via ORAL

## 2021-06-25 MED ORDER — OXYCODONE HCL 5 MG PO TABS
5 MG | ORAL_TABLET | Freq: Four times a day (QID) | ORAL | 0 refills | Status: AC | PRN
Start: 2021-06-25 — End: 2021-06-28

## 2021-06-25 MED ORDER — NICOTINE 14 MG/24HR TD PT24
1424 MG/24HR | Freq: Once | TRANSDERMAL | Status: DC
Start: 2021-06-25 — End: 2021-06-25
  Administered 2021-06-25: 17:00:00 1 via TRANSDERMAL

## 2021-06-25 MED FILL — OXYCODONE-ACETAMINOPHEN 5-325 MG PO TABS: 5-325 mg | ORAL | Qty: 1

## 2021-06-25 MED FILL — NICOTINE 14 MG/24HR TD PT24: 14 mg/(24.h) | TRANSDERMAL | Qty: 1

## 2021-06-25 NOTE — Discharge Instructions (Signed)
You were seen in the emergency department today after a fall.  You were found to have multiple chronic compression fractures including T7, T12, and L1 1.  You are also noted to have multiple chronic findings in the lumbar spine which were likely exacerbated by the fall. Take Tylenol for pain, I have prescribed Oxycodone for breakthrough pain.    Follow-up with primary care physician regarding back pain.  He may require an MRI for further evaluation if pain does not continue to improve.

## 2021-06-25 NOTE — ED Provider Notes (Signed)
**ADVANCED PRACTICE PROVIDER, [facility] HAVE [facility]AL[facility]  [facility]  EMERGENCY DEPARTMENT ENCOUNTER      Pt Name: Lauren Bryant  ZOX:[id number]  Birthdate [birth year]  Date of evaluation: 06/25/2021  Provider: Foye Spurling, PA      Chief Complaint:    Chief Complaint   Patient presents with   ??? Fall     fall out of bed last night lower back pain today         Nursing Notes, Past Medical Hx, Past Surgical Hx, Social Hx, Allergies, and Family Hx were all reviewed and agreed with or any disagreements were addressed in the HPI.    HPI: (Location, Duration, Timing, Severity, Quality, Assoc Sx, Context, Modifying factors)    Chief Complaint of fall.    This is a  87 y.o. female who presents via EMS from skilled nursing facility with past medical history of old compression fracture of thoracic vertebrae, TIA, and high cholesterol.  The patient lives in an independent living facility.  Her granddaughter presents with her today.  The patient is able to tell me that she was waiting for "Jillyn Hidden" to show up last night and when he did not arrive she got up to shut off the light, will walking back to the bed she states she passed out. She states she has an issue with dizziness due to low blood pressure.  The patient's son is currently coming into town to stay with her for 3 days.  The patient is currently complaining of low back pain and has had some difficulty ambulating.  She rates her pain as 8/10.  She does states she was able to get up off the floor and get back in bed.  Although she states that low blood pressure is an ongoing issue for her she has never lost consciousness.    PastMedical/Surgical History:      Diagnosis Date   ??? Compression fracture of body of thoracic vertebra (HCC)    ??? Frequent UTI    ??? Hyperlipidemia    ??? TIA 2015         Procedure Laterality Date   ??? HYSTERECTOMY (CERVIX STATUS UNKNOWN)     ??? TUBAL LIGATION         Medications:  Discharge Medication List as of 06/25/2021  1:39  PM      CONTINUE these medications which have NOT CHANGED    Details   trimethoprim (TRIMPEX) 100 MG tablet Take 1 tablet by mouth daily, Disp-90 tablet, R-1Normal      simvastatin (ZOCOR) 20 MG tablet Take 1 tablet by mouth nightly, Disp-14 tablet, R-0Normal      venlafaxine (EFFEXOR XR) 37.5 MG extended release capsule TAKE 1 CAPSULE TWICE A DAY, Disp-180 capsule, R-3Normal      docusate sodium (STOOL SOFTENER) 100 MG capsule Take 100 mg by mouth 2 times dailyHistorical Med      ferrous sulfate 325 (65 Fe) MG tablet Take 325 mg by mouth daily (with breakfast)Historical Med      aspirin 81 MG tablet Take 81 mg by mouth daily               Review of Systems:  (2-9 systems needed)  Review of Systems   Constitutional: Negative for chills and fever.   HENT: Negative for congestion.    Eyes: Negative for visual disturbance.   Respiratory: Negative for cough, chest tightness and shortness of breath.  Cardiovascular: Negative for chest pain and palpitations.   Gastrointestinal: Negative for abdominal pain, nausea and vomiting.   Genitourinary: Negative for dysuria, frequency and urgency.   Musculoskeletal: Positive for back pain.   Skin: Negative for rash.   Neurological: Positive for syncope. Negative for dizziness, weakness and headaches.           Physical Exam:  Physical Exam  Vitals and nursing note reviewed.   Constitutional:       Appearance: Normal appearance. She is not diaphoretic.   HENT:      Head: Normocephalic and atraumatic.      Nose: Nose normal.      Mouth/Throat:      Mouth: Mucous membranes are moist.   Eyes:      General:         Right eye: No discharge.         Left eye: No discharge.      Extraocular Movements: Extraocular movements intact.      Pupils: Pupils are equal, round, and reactive to light.   Cardiovascular:      Rate and Rhythm: Normal rate and regular rhythm.      Pulses: Normal pulses.      Heart sounds: Normal heart sounds. No murmur heard.  No friction rub. No gallop.    Pulmonary:       Effort: Pulmonary effort is normal. No respiratory distress.      Breath sounds: Normal breath sounds. No stridor. No wheezing, rhonchi or rales.   Abdominal:      General: Abdomen is flat.      Palpations: Abdomen is soft.      Tenderness: There is no abdominal tenderness. There is no guarding or rebound.   Musculoskeletal:         General: Normal range of motion.      Cervical back: Normal, normal range of motion and neck supple.      Thoracic back: Normal.        Back:       Comments: Full active range of motion of lower legs.  Unremarkable straight leg test.  Pedal pulses are 2+ and cap refill less than 2 seconds.   Skin:     General: Skin is warm and dry.      Coloration: Skin is not pale.   Neurological:      General: No focal deficit present.      Mental Status: She is alert and oriented to person, place, and time.      GCS: GCS eye subscore is 4. GCS verbal subscore is 5. GCS motor subscore is 6.   Psychiatric:         Mood and Affect: Mood normal.         Behavior: Behavior normal.         MEDICAL DECISION MAKING    Vitals:    Vitals:    06/25/21 0944 06/25/21 1200   BP: (!) 119/55 (!) 120/58   Pulse: 81 75   Resp: 16 17   Temp: 98.8 ??F (37.1 ??C)    SpO2: 96% 96%   Weight: 107 lb (48.5 kg)        LABS:  Labs Reviewed   CBC WITH AUTO DIFFERENTIAL - Abnormal; Notable for the following components:       Result Value    RBC 2.92 (*)     Hemoglobin 8.6 (*)     Hematocrit 26.8 (*)     RDW 16.5 (*)  MPV 11.3 (*)     Lymphocytes Absolute 0.5 (*)     All other components within normal limits   COMPREHENSIVE METABOLIC PANEL W/ REFLEX TO MG FOR LOW K - Abnormal; Notable for the following components:    Glucose 102 (*)     Total Protein 6.0 (*)     Albumin/Globulin Ratio 2.3 (*)     Alkaline Phosphatase 35 (*)     All other components within normal limits   URINALYSIS WITH REFLEX TO CULTURE - Abnormal; Notable for the following components:    Clarity, UA SL CLOUDY (*)     Blood, Urine TRACE-INTACT (*)      Nitrite, Urine POSITIVE (*)     Leukocyte Esterase, Urine SMALL (*)     All other components within normal limits   MICROSCOPIC URINALYSIS - Abnormal; Notable for the following components:    WBC, UA 10-20 (*)     RBC, UA 5-10 (*)     Renal Epithelial, UA 2-5 (*)     Bacteria, UA 4+ (*)     All other components within normal limits   CULTURE, URINE   TROPONIN        Remainder of labs reviewed and were negative at this time or not returned at the time of this note.    RADIOLOGY:   Non-plain film images such as CT, Ultrasound and MRI are read by the radiologist. I, Foye Spurling, PA have directly visualized the radiologic plain film image(s) with the below findings:      Interpretation per the Radiologist below, if available at the time of this note:    CT Head WO Contrast   Final Result   No acute intracranial abnormality.      Mild generalized cerebral atrophy and chronic small vessel white matter   ischemic change.         CT Cervical Spine WO Contrast   Final Result   Cervical spine-      No acute fracture or subluxation.      Moderate multilevel spondylosis.      ---      Thoracic spine-      T7 compression fracture with moderate to severe height loss, most likely   chronic.      T12 compression fracture with moderate anterior height loss and mild   retropulsion, likely subacute.      Multilevel spondylosis.      ---      Lumbar spine-      L1 compression fracture with moderate to severe height loss and mild   retropulsion, estimated to be chronic.      L3-4 small right paracentral disc protrusion, without impingement on neural   structures.      L5-S1 grade 1 spondylolisthesis associated with moderate bilateral foraminal   narrowing.      Multilevel spondylosis, most significantly associated with mild-to-moderate   narrowing of the central canal at L4-5.      RECOMMENDATIONS:   MRI would evaluate further for acuity, if clinically indicated.         CT Lumbar Spine WO Contrast   Final Result   Cervical spine-       No acute fracture or subluxation.      Moderate multilevel spondylosis.      ---      Thoracic spine-      T7 compression fracture with moderate to severe height loss, most likely   chronic.      T12 compression  fracture with moderate anterior height loss and mild   retropulsion, likely subacute.      Multilevel spondylosis.      ---      Lumbar spine-      L1 compression fracture with moderate to severe height loss and mild   retropulsion, estimated to be chronic.      L3-4 small right paracentral disc protrusion, without impingement on neural   structures.      L5-S1 grade 1 spondylolisthesis associated with moderate bilateral foraminal   narrowing.      Multilevel spondylosis, most significantly associated with mild-to-moderate   narrowing of the central canal at L4-5.      RECOMMENDATIONS:   MRI would evaluate further for acuity, if clinically indicated.         CT THORACIC SPINE WO CONTRAST   Final Result   Cervical spine-      No acute fracture or subluxation.      Moderate multilevel spondylosis.      ---      Thoracic spine-      T7 compression fracture with moderate to severe height loss, most likely   chronic.      T12 compression fracture with moderate anterior height loss and mild   retropulsion, likely subacute.      Multilevel spondylosis.      ---      Lumbar spine-      L1 compression fracture with moderate to severe height loss and mild   retropulsion, estimated to be chronic.      L3-4 small right paracentral disc protrusion, without impingement on neural   structures.      L5-S1 grade 1 spondylolisthesis associated with moderate bilateral foraminal   narrowing.      Multilevel spondylosis, most significantly associated with mild-to-moderate   narrowing of the central canal at L4-5.      RECOMMENDATIONS:   MRI would evaluate further for acuity, if clinically indicated.         XR CHEST PORTABLE   Final Result   No acute cardiopulmonary disease.                     MEDICAL DECISION MAKING / ED  COURSE:      Patient was evaluated in the emergency department today after a fall.  The fall did occur last night.  She is complaining of low back pain.  She was given Percocet in the emergency department for pain, with good relief.  Given the patient's syncopal episode we did obtain cardiac work-up.    Work-up results include:  CBC without evidence of leukocytosis, hemoglobin is stable at 8.6.  CMP without acute electrolyte abnormality maintain renal function  Troponin is less than 0.01  Urinalysis is positive for nitrates and leukocytes however the patient does have a chronic UTI and is currently on low-dose antibiotic therefore do not think this needs to be treated.    CT imaging was obtained of head, cervical spine, thoracic, and lumbar spine.  Imaging results are notable for no acute intracranial abnormality or cervical spine abnormality.  She is noted to have multiple chronic findings on lumbar and thoracic spine.  These findings include T7 compression fracture, T12 compression fracture, and L1 compression fracture.  These do appear to be chronic, the T12 is questionably subacute however that is the known compression fracture in her thoracic spine that the family had previously reported.  She is also noted to have L3-L4 disc protrusion without  impingement and/or neural structure.  Finally chronic findings and L5-S1.    Patient was given:  Medications   oxyCODONE-acetaminophen (PERCOCET) 5-325 MG per tablet 1 tablet (1 tablet Oral Given 06/25/21 1210)     A discussion was had with the patient and family member regarding all lab and imaging results.  She was ambulated by nursing staff without difficulty.  The patient did become very antsy requesting to be discharged home.  She does have her son who is staying with her for the next 3 days.  Given the patient is able to ambulate without assistance I do think she is appropriate for discharge.  We discussed that if her back pain persist she may require an MRI for  further evaluation to determine if these compression fractures are truly chronic versus subacute.  She is advised to take Tylenol for primary pain relief.  I will prescribe short course of oxycodone for breakthrough pain.  We discussed return precautions.  Patient and family are in agreement treatment plan she is discharged home in good condition.    The patient tolerated their visit well.  I evaluated the patient.  The physician was available for consultation as needed.  The patient and / or the family were informed of the results of any tests, a time was given to answer questions, a plan was proposed and they agreed with plan.      CLINICAL IMPRESSION:  1. Fall, initial encounter    2. Acute midline low back pain without sciatica    3. Chronic UTI    4. Compression fracture of T7 vertebra, initial encounter (HCC)    5. Compression fracture of T12 vertebra, initial encounter (HCC)    6. Closed compression fracture of body of L1 vertebra (HCC)      Results for orders placed or performed during the hospital encounter of 06/25/21   CBC with Auto Differential   Result Value Ref Range    WBC 7.6 4.0 - 11.0 K/uL    RBC 2.92 (L) 4.00 - 5.20 M/uL    Hemoglobin 8.6 (L) 12.0 - 16.0 g/dL    Hematocrit 35.0 (L) 36.0 - 48.0 %    MCV 91.8 80.0 - 100.0 fL    MCH 29.5 26.0 - 34.0 pg    MCHC 32.1 31.0 - 36.0 g/dL    RDW 09.3 (H) 81.8 - 15.4 %    Platelets 164 135 - 450 K/uL    MPV 11.3 (H) 5.0 - 10.5 fL    Neutrophils % 85.6 %    Lymphocytes % 6.2 %    Monocytes % 6.9 %    Eosinophils % 0.8 %    Basophils % 0.5 %    Neutrophils Absolute 6.5 1.7 - 7.7 K/uL    Lymphocytes Absolute 0.5 (L) 1.0 - 5.1 K/uL    Monocytes Absolute 0.5 0.0 - 1.3 K/uL    Eosinophils Absolute 0.1 0.0 - 0.6 K/uL    Basophils Absolute 0.0 0.0 - 0.2 K/uL   Comprehensive Metabolic Panel w/ Reflex to MG   Result Value Ref Range    Sodium 138 136 - 145 mmol/L    Potassium reflex Magnesium 4.3 3.5 - 5.1 mmol/L    Chloride 102 99 - 110 mmol/L    CO2 27 21 - 32 mmol/L     Anion Gap 9 3 - 16    Glucose 102 (H) 70 - 99 mg/dL    BUN 14 7 - 20 mg/dL    CREATININE 0.8 0.6 -  1.2 mg/dL    GFR Non-African American >60 >60    GFR African American >60 >60    Calcium 9.2 8.3 - 10.6 mg/dL    Total Protein 6.0 (L) 6.4 - 8.2 g/dL    Albumin 4.2 3.4 - 5.0 g/dL    Albumin/Globulin Ratio 2.3 (H) 1.1 - 2.2    Total Bilirubin 0.5 0.0 - 1.0 mg/dL    Alkaline Phosphatase 35 (L) 40 - 129 U/L    ALT 12 10 - 40 U/L    AST 21 15 - 37 U/L   Troponin   Result Value Ref Range    Troponin <0.01 <0.01 ng/mL   Urinalysis with Reflex to Culture    Specimen: Urine   Result Value Ref Range    Color, UA Yellow Straw/Yellow    Clarity, UA SL CLOUDY (A) Clear    Glucose, Ur Negative Negative mg/dL    Bilirubin Urine Negative Negative    Ketones, Urine Negative Negative mg/dL    Specific Gravity, UA 1.015 1.005 - 1.030    Blood, Urine TRACE-INTACT (A) Negative    pH, UA 7.0 5.0 - 8.0    Protein, UA Negative Negative mg/dL    Urobilinogen, Urine 0.2 <2.0 E.U./dL    Nitrite, Urine POSITIVE (A) Negative    Leukocyte Esterase, Urine SMALL (A) Negative    Microscopic Examination YES     Urine Type NotGiven     Urine Reflex to Culture Yes    Microscopic Urinalysis   Result Value Ref Range    WBC, UA 10-20 (A) 0 - 5 /HPF    RBC, UA 5-10 (A) 0 - 4 /HPF    Renal Epithelial, UA 2-5 (A) 0 - 1 /HPF    Bacteria, UA 4+ (A) None Seen /HPF   EKG 12 Lead   Result Value Ref Range    Ventricular Rate 78 BPM    Atrial Rate 78 BPM    P-R Interval 146 ms    QRS Duration 108 ms    Q-T Interval 414 ms    QTc Calculation (Bazett) 471 ms    P Axis 52 degrees    R Axis 85 degrees    T Axis 35 degrees    Diagnosis       Sinus rhythm with Premature supraventricular complexesIncomplete right bundle branch blockBorderline ECGWhen compared with ECG of 12-Sep-2018 10:51,Premature supraventricular complexes are now PresentConfirmed by Verdon CumminsKpaeyeh, J Alvin [phone]([id number]) on 06/25/2021 2:54:43 PM       I estimate there is LOW risk for ABDOMINAL AORTIC ANEURYSM, CAUDA  EQUINA SYNDROME, EPIDURAL MASS LESION, SPINAL STENOSIS, OR HERNIATED DISK CAUSING SEVERE STENOSIS, thus I consider the discharge disposition reasonable. Ursula Beathla D Dewalt and I have discussed the diagnosis and risks, and we agree with discharging home to follow-up with their primary doctor. We also discussed returning to the Emergency Department immediately if new or worsening symptoms occur. We have discussed the symptoms which are most concerning (e.g., saddle anesthesia, urinary or bowel incontinence or retention, changing or worsening pain) that necessitate immediate return.    Final Impression    1. Fall, initial encounter    2. Acute midline low back pain without sciatica    3. Chronic UTI    4. Compression fracture of T7 vertebra, initial encounter (HCC)    5. Compression fracture of T12 vertebra, initial encounter (HCC)    6. Closed compression fracture of body of L1 vertebra (HCC)        Blood pressure Marland Kitchen(!)  120/58, pulse 75, temperature 98.8 ??F (37.1 ??C), resp. rate 17, weight 107 lb (48.5 kg), SpO2 96 %.      DISPOSITION Decision To Discharge 06/25/2021 01:30:39 PM      PATIENT REFERRED TO:  No follow-up provider specified.    DISCHARGE MEDICATIONS:  Discharge Medication List as of 06/25/2021  1:39 PM      START taking these medications    Details   oxyCODONE (ROXICODONE) 5 MG immediate release tablet Take 1 tablet by mouth every 6 hours as needed for Pain for up to 3 days. Intended supply: 3 days. Take lowest dose possible to manage pain, Disp-12 tablet, R-0Print             DISCONTINUED MEDICATIONS:  Discharge Medication List as of 06/25/2021  1:39 PM                 (Please note the MDM and HPI sections of this note were completed with a voice recognition program.  Efforts were made to edit the dictations but occasionally words are mis-transcribed.)    Electronically signed, Foye Spurling, PA,           Foye Spurling, [place]  06/25/21 1911

## 2021-06-25 NOTE — ED Provider Notes (Signed)
Patient seen and evaluated by APP.        EKG: Sinus rhythm with PACs.  Normal axis.  Prolonged QTC of 471.  Incomplete right bundle branch block noted.  QTC has prolonged significantly since previous EKG on September 12, 2018.         Phineas Semen, DO  06/25/21 1533

## 2021-06-25 NOTE — ED Notes (Signed)
Pt able to get onto bedside commode. Pt c/o pain and stiffness on initially getting out of stretcher. Pt able to move 54ft via walker in room. Pt expressed that she would like to go home now. PA Christina aware.      Dot Been Dontrel Smethers  06/25/21 1256

## 2021-06-25 NOTE — ED Notes (Addendum)
Pt attempt to climb out of bed several times family aware and at bedside.      Marveen Reeks, RN  06/25/21 1357       Marveen Reeks, RN  06/25/21 [phone]

## 2021-06-27 LAB — CULTURE, URINE: Urine Culture, Routine: >100000

## 2021-07-11 ENCOUNTER — Encounter: Attending: Registered Nurse | Primary: Registered Nurse

## 2021-07-13 ENCOUNTER — Ambulatory Visit: Admit: 2021-07-13 | Discharge: 2021-07-19 | Payer: MEDICARE | Primary: Registered Nurse

## 2021-07-13 ENCOUNTER — Ambulatory Visit: Admit: 2021-07-13 | Discharge: 2021-07-13 | Payer: MEDICARE | Attending: Surgical | Primary: Registered Nurse

## 2021-07-13 DIAGNOSIS — S22060A Wedge compression fracture of T7-T8 vertebra, initial encounter for closed fracture: Secondary | ICD-10-CM

## 2021-07-13 NOTE — Progress Notes (Signed)
New Patient: Lauren Bryant      CHIEF COMPLAINT:    Chief Complaint   Patient presents with    Back Pain     Patient had a fall on 06/25/2021.  T7, T12, L1 compression fractures.        HISTORY OF PRESENT ILLNESS:     Lauren Bryant is a pleasant 87 y.o. female here for consultation regarding her thoracolumbar back pain. She states her pain began after a fall on 06/24/21. Her pain has steadily persisted since then.  She rates her back pain 8/10 and leg pain 0/10. She describes the pain as aching. Pain is noted across her lower thoracic Bryant around her thoracolumbar junction. Initially pain would radiate into bilateral lower ribcage, but is now localized to lower thoracic Bryant. She denies lower extremity radicular pain, numbness or weakness. She denies saddle numbness or bowel or bladder dysfunction. The pain moderately disrupts her sleep. She denies neck pan or upper extremity symptoms. She was treated for T12 compression fracture 1 year ago with conservative care/brace and healed well. However, she is not as ambulatory at this time and has difficult controlling pain due to constipation from opioids.     Current/Past Treatment:   Physical Therapy: No  Chiropractic:  No   Injection:  No   Medications:  Tylenol BID     Past Medical History:   Past Medical History:   Diagnosis Date    Compression fracture of body of thoracic vertebra (HCC)     Frequent UTI     Hyperlipidemia     TIA 2015        Past Surgical History:     Past Surgical History:   Procedure Laterality Date    HYSTERECTOMY (CERVIX STATUS UNKNOWN)      TUBAL LIGATION         Current Medications:     Current Outpatient Medications:     oxyCODONE (ROXICODONE) 5 MG immediate release tablet, TAKE 1 TABLET BY MOUTH EVERY 6 HOURS AS NEEDED FOR PAIN, Disp: , Rfl:     trimethoprim (TRIMPEX) 100 MG tablet, Take 1 tablet by mouth daily, Disp: 90 tablet, Rfl: 1    simvastatin (ZOCOR) 20 MG tablet, Take 1 tablet by mouth nightly, Disp: 14 tablet, Rfl: 0    venlafaxine  (EFFEXOR XR) 37.5 MG extended release capsule, TAKE 1 CAPSULE TWICE A DAY, Disp: 180 capsule, Rfl: 3    docusate sodium (STOOL SOFTENER) 100 MG capsule, Take 100 mg by mouth 2 times daily, Disp: , Rfl:     ferrous sulfate 325 (65 Fe) MG tablet, Take 325 mg by mouth daily (with breakfast), Disp: , Rfl:     aspirin 81 MG tablet, Take 81 mg by mouth daily, Disp: , Rfl:     Allergies:  Macrobid [nitrofurantoin]    Social History:    reports that she quit smoking about 16 months ago. Her smoking use included cigarettes. She smoked an average of .5 packs per day. She has never used smokeless tobacco. She reports that she does not drink alcohol.    Family History:   No family history on file.    REVIEW OF SYSTEMS: Full ROS noted & scanned   CONSTITUTIONAL: Denies unexplained weight loss, fevers, chills or fatigue  NEUROLOGICAL: Denies unsteady gait or progressive weakness  MUSCULOSKELETAL: Denies joint swelling or redness  PSYCHOLOGICAL: Denies anxiety, depression   SKIN: Denies skin changes, delayed healing, rash, itching   HEMATOLOGIC: Denies easy bleeding or bruising  ENDOCRINE: Denies excessive thirst, urination, heat/cold  RESPIRATORY: Denies current dyspnea, cough  GI: Denies nausea, vomiting, diarrhea   GU: Denies bowel or bladder issues      PHYSICAL EXAM:    Vitals: Height 5\' 5"  (1.651 m), weight 107 lb (48.5 kg).    GENERAL EXAM:  General Apparence: Patient is adequately groomed with no evidence of malnutrition.  Orientation: The patient is oriented to time, place and person.   Mood & Affect:The patient's mood and affect are appropriate.  Vascular: Examination reveals no swelling tenderness in upper or lower extremities. Good capillary refill.  Lymphatic: The lymphatic examination bilaterally reveals all areas to be without enlargement or induration  Sensation: Sensation is intact without deficit  Coordination/Balance: Good coordination.     Lauren/SACRAL EXAMINATION:  Inspection: Local inspection shows no  step-off or bruising.  Lauren alignment is normal.  Sagittal and Coronal balance is neutral.      Palpation:   No evidence of tenderness at the midline.  Mild tenderness around thoracolumbar junction to paraspinal area.  There is no step-off or paraspinal spasm.   Range of Motion: Lauren flexion, extension and rotation are mildly limited due to pain.  Strength:   Strength testing is 5/5 in all muscle groups tested.   Special Tests:   Straight leg raise and crossed SLR negative.  Leg length and pelvis level.   Skin: There are no rashes, ulcerations or lesions.  Reflexes: Reflexes are symmetrically 1+ at the patellar and ankle tendons.  Clonus absent bilaterally at the feet.  Gait & station: Gait not observed. Patient arrived via wheelchair. Able to stand to sit/lay on table with 1-2 person assist.     Additional Examinations:   RIGHT LOWER EXTREMITY: Inspection/examination of the right lower extremity does not show any tenderness, deformity or injury. Range of motion is unremarkable. There is no gross instability.  There are no rashes, ulcerations or lesions. Strength and tone are normal.  LEFT LOWER EXTREMITY:  Inspection/examination of the left lower extremity does not show any tenderness, deformity or injury. Range of motion is unremarkable. There is no gross instability. There are no rashes, ulcerations or lesions.  Strength and tone are normal.    Diagnostic Testing:    I independently reviewed CT cervical, thoracic and Lauren Bryant from 06/25/21. They show T7 compression fracture with moderate to severe height loss, likely chronic. T11  compression fracture with moderate anterior height loss, possibly subacute. L1 compression fracture with moderate to severe height loss, likely chronic. L2 and L3 schmorl's nodes noted. No obvious fracture noted to cervical Bryant.    I independently reviewed AP and lateral xray images of her thoracolumbar Bryant from the office today. They show stable T7, T12 and L1 compression  fractures, with a new T11 compression fracture in comparison to recent CT imaging.    CT thoracic and Lauren Bryant 03/02/20 notes superior endplate compression fracture T12, and severe remote L1 fracture.     Impression:   T7 and T11 compression fractures  Remote T12 and L1 compression fractures    Plan:    We discussed treatment options including observation, a jewett orthosis, or thoracic kyphoplasty. Given her poor pain control and inability to tolerate the brace, she wishes to proceed with thoracic MRI for consideration for kyphoplasty. We will call her with her MRI results.     05/02/20, PA-C

## 2021-07-14 NOTE — Telephone Encounter (Signed)
That is a discussion that needs to be discussed with the surgeon. The surgeon needs to review risks and benefits of the procedure and I think that would be better determined after the MRI. There are risks with surgery due to her age but I think the risks and benefits should be discussed by the surgeon.

## 2021-07-14 NOTE — Telephone Encounter (Signed)
General Question     Subject: MRI  Patient and /or Facility Request: Lauren Bryant, Lauren Bryant  Contact Number: [phone]    PATIENT'S DAUGHTER [name] IS CALLING REGARDING MRI FOR HER MOTHER. SHE IS SCHEDULED AT [facility] ON 5 MILE AND WANTS TO MAKE SURE THE OPEN MRI IS OK.    PLEASE RETURN CALL TO DAUGHTER AT THE ABOVE NUMBER.

## 2021-07-14 NOTE — Telephone Encounter (Signed)
Pt notified

## 2021-07-14 NOTE — Telephone Encounter (Signed)
Please advise

## 2021-07-14 NOTE — Telephone Encounter (Signed)
Yes, and spoke to her daughter about this.

## 2021-07-14 NOTE — Telephone Encounter (Signed)
Call from daughter wanting to discuss the risks of patient having surgery.    Patient has a compression fracture and will need surgery.  Caller is concerned regarding the side effects of the anesthesia on the patient due to her age, 74.  Patient is scheduled for an MRI Monday and surgery will be scheduled after that.    Please contact daughter Lauren Bryant .

## 2021-07-18 ENCOUNTER — Ambulatory Visit: Payer: MEDICARE | Primary: Registered Nurse

## 2021-07-18 NOTE — Telephone Encounter (Signed)
Spoke to her daughter about this and checking with radiology to see if she can have her kyphoplasty without new mri, she  has a wire in her ear that was implanted along time ago. She is unable to have her MRI.

## 2021-07-18 NOTE — Telephone Encounter (Signed)
Patients daugter states that she has a wire implanted into her ear and she is unable to get the mri per the facility.   Wants to know what else they can do.  She states she is in a lot of pain and would like to move forward with this.

## 2021-07-19 NOTE — Telephone Encounter (Signed)
General Question     Subject: TEST ORDER  Patient and /or Facility Request: Lauren Bryant, Lauren Bryant  Contact Number: [phone]    PATIENT DAUGHTER CALLED ASKING IF THE OFFICE ORDER KYPHOLASTY YET.Marland Kitchen    PLEASE CALL THE PATIENT BACK AT THE ABOVE NUMBER.Marland Kitchen

## 2021-07-20 NOTE — Telephone Encounter (Signed)
Spoke to her daughter forwarded her order to [facility] instead of [facility].

## 2021-07-22 ENCOUNTER — Encounter

## 2021-07-27 ENCOUNTER — Inpatient Hospital Stay: Admit: 2021-07-27 | Payer: MEDICARE | Primary: Registered Nurse

## 2021-07-27 DIAGNOSIS — M546 Pain in thoracic spine: Secondary | ICD-10-CM

## 2021-07-27 MED ORDER — TECHNETIUM TC 99M MEDRONATE IV KIT
Freq: Once | INTRAVENOUS | Status: AC | PRN
Start: 2021-07-27 — End: 2021-07-27
  Administered 2021-07-27: 15:00:00 25 via INTRAVENOUS

## 2021-08-03 NOTE — Telephone Encounter (Signed)
Spoke to her daughter, the IR radiologist looked at her Bone scan and felt her fracture was too far along and decided not to do the Kyphoplasty.

## 2021-08-03 NOTE — Progress Notes (Signed)
NM Bone Scan reviewed by IR MD. Dr. Halina Maidens does not think the pt is a candidate for a kyphoplasty based on imaging. Pt's POA, Kendal Hymen called, and message left with call back number if she has any questions or concerns.

## 2021-08-03 NOTE — Telephone Encounter (Signed)
-----   Message from Nadene Rubins, MD sent at 08/02/2021  8:12 AM EDT -----  Shows acute T7 fracture  Okay to proceed with kyphoplasty    ----- Message -----  From: Collene Schlichter, MA  Sent: 08/01/2021   5:06 PM EDT  To: Nadene Rubins, MD    Can you review bone scan. IR from [facility] ordered it.

## 2021-08-04 NOTE — Telephone Encounter (Signed)
She will have to have an appointment. We can do virtual or telephone but insurance will require a visit.

## 2021-08-04 NOTE — Telephone Encounter (Signed)
Pt's daughter Kendal Hymen [phone], Requesting Order for Physical Therapy in Home because Pt has fracture at TL location on 06/24/21 & she is Difficulty getting up moving around, has weakness.  Please Call Daughter back when ready.

## 2021-08-05 NOTE — Telephone Encounter (Signed)
Pt scheduled.

## 2021-08-08 ENCOUNTER — Telehealth: Admit: 2021-08-08 | Payer: MEDICARE | Attending: Registered Nurse | Primary: Registered Nurse

## 2021-08-08 DIAGNOSIS — S22060D Wedge compression fracture of T7-T8 vertebra, subsequent encounter for fracture with routine healing: Secondary | ICD-10-CM

## 2021-08-08 NOTE — Progress Notes (Signed)
Lauren Bryant is a 87 y.o. female evaluated via telephone on 08/08/2021 for Referral - General (PT. )  .        Documentation:  I communicated with the patient and/or health care decision maker about back pain.   Details of this discussion including any medical advice provided:    Patient presents today via phone with her daughter present.  Patient fell on July 1 and was diagnosed with a T7 compression fracture.  She underwent further testing with orthopedic and they were planning to do a kyphoplasty however the radiologist felt that her fracture was too far along and healing to be able to do the kyphoplasty at that time.  Currently her daughter states that her pain is tolerable with the assistance of Tylenol couple times a day.  Her daughter does feel like the pain is improving however her mobility is worsening.  She is feeling weak and only able to walk with someone else with her.  She currently lives at the [place] [facility] and she has aides with her during the day from 8 AM until 8 PM.  Her daughter is hopeful that we can get some physical therapy done to get her back to walking on her own with a walker.    [name] was seen today for referral - general.    Diagnoses and all orders for this visit:    Closed wedge compression fracture of T7 vertebra with routine healing, subsequent encounter  Referral given to home health at [facility] care for PT OT.  Can continue with Tylenol and ibuprofen as needed.  -     External Referral To Home Health    Weakness  PT OT to help strengthen her muscles.  -     External Referral To Home Health    Decreased mobility and endurance  -     External Referral To Home Health           [name] was evaluated through a synchronous (real-time) audio encounter. Patient identification was verified at the start of the visit. She (or guardian if applicable) is aware that this is a billable service, which includes applicable co-pays. This visit was conducted with the patient's (and/or  legal guardian's) verbal consent. She has not had a related appointment within my department in the past 7 days or scheduled within the next 24 hours.   The patient was located at Home: [address]  [place] [address].  The provider was located at [facility] (Appt Dept): [address]  [place],  [place] [address].    Note: not billable if this call serves to triage the patient into an appointment for the relevant concern    [name], APRN - CNP

## 2021-08-10 NOTE — Telephone Encounter (Signed)
Sheppard Penton from [facility] would like verbal orders for the patient to receive Home PT and OT.  Please call with verbal, you can leave a message it is a secure line.  [phone]

## 2021-08-10 NOTE — Telephone Encounter (Signed)
Verbal order given per per verbal ok taya

## 2021-08-23 ENCOUNTER — Encounter

## 2021-08-23 ENCOUNTER — Ambulatory Visit: Admit: 2021-08-23 | Payer: MEDICARE | Attending: Registered Nurse | Primary: Registered Nurse

## 2021-08-23 DIAGNOSIS — Z0000 Encounter for general adult medical examination without abnormal findings: Secondary | ICD-10-CM

## 2021-08-23 NOTE — Progress Notes (Deleted)
Medicare Annual Wellness Visit    Lauren Bryant is here for Annual Exam (Needs order for blood work also)    Assessment & Plan   Initial Medicare annual wellness visit    Recommendations for Preventive Services Due: see orders and patient instructions/AVS.  Recommended screening schedule for the next 5-10 years is provided to the patient in written form: see Patient Instructions/AVS.     Return for Medicare Annual Wellness Visit in 1 year.     Subjective   The following acute and/or chronic problems were also addressed today:      Patient's complete Health Risk Assessment and screening values have been reviewed and are found in Flowsheets. The following problems were reviewed today and where indicated follow up appointments were made and/or referrals ordered.    Positive Risk Factor Screenings with Interventions:    Fall Risk:  Do you feel unsteady or are you worried about falling? : (!) yes  2 or more falls in past year?: (!) yes  Fall with injury in past year?: (!) yes   Fall Risk Interventions:    {Medicare AWV Falls Risk Interventions:210200096}            General Health and ACP:       Advance Directives       Power of Attorney Living Will ACP-Advance Directive ACP-Power of Attorney    Not on File Not on File Not on File Not on File        General Health Risk Interventions:  {Medicare AWV General Health Risk Interventions:210200117}    Health Habits/Nutrition:  Physical Activity: Not on file        Body mass index: (!) 16.97     Health Habits/Nutrition Interventions:  {Medicare AWV Health Habits/Nutrition Interventions:210200121}             Objective   Vitals:    08/23/21 1257   BP: 102/60   Pulse: 62   SpO2: 94%   Weight: 102 lb (46.3 kg)   Height: 5\' 5"  (1.651 m)      Body mass index is 16.97 kg/m??.      {OPTIONAL - GENERAL PHYSICAL EXAM (WILL AUTO-DELETE IF NOT       Allergies   Allergen Reactions    Macrobid [Nitrofurantoin] Rash     Prior to Visit Medications    Medication Sig Taking?  Authorizing Provider   trimethoprim (TRIMPEX) 100 MG tablet Take 1 tablet by mouth daily  KWIO):[id number]}, APRN - CNP   simvastatin (ZOCOR) 20 MG tablet Take 1 tablet by mouth nightly  [name], APRN - CNP   venlafaxine (EFFEXOR XR) 37.5 MG extended release capsule TAKE 1 CAPSULE TWICE A DAY  Taya [name], APRN - CNP   docusate sodium (COLACE) 100 MG capsule Take 100 mg by mouth 2 times daily  Historical Provider, MD   ferrous sulfate 325 (65 Fe) MG tablet Take 325 mg by mouth daily (with breakfast)  Historical Provider, MD   aspirin 81 MG tablet Take 81 mg by mouth daily  Historical Provider, MD       CareTeam (Including outside providers/suppliers regularly involved in providing care):   Patient Care Team:  Jeraldine Loots, APRN - CNP as PCP - General (Nurse Practitioner Family)  [name], APRN - CNP as PCP - [facility] Empaneled Provider  [facility], RN as Care Transitions Nurse     Reviewed and updated this visit:  Allergies   Meds

## 2021-08-23 NOTE — Patient Instructions (Signed)
Personalized Preventive Plan for Lauren Bryant - 08/23/2021  Medicare offers a range of preventive health benefits. Some of the tests and screenings are paid in full while other may be subject to a deductible, co-insurance, and/or copay.    Some of these benefits include a comprehensive review of your medical history including lifestyle, illnesses that may run in your family, and various assessments and screenings as appropriate.    After reviewing your medical record and screening and assessments performed today your provider may have ordered immunizations, labs, imaging, and/or referrals for you.  A list of these orders (if applicable) as well as your Preventive Care list are included within your After Visit Summary for your review.    Other Preventive Recommendations:    A preventive eye exam performed by an eye specialist is recommended every 1-2 years to screen for glaucoma; cataracts, macular degeneration, and other eye disorders.  A preventive dental visit is recommended every 6 months.  Try to get at least 150 minutes of exercise per week or 10,000 steps per day on a pedometer .  Order or download the FREE "Exercise & Physical Activity: Your Everyday Guide" from [employer]. Call (850)818-3759 or search [employer] online.  You need 1200-1500 mg of calcium and 1000-2000 IU of vitamin D per day. It is possible to meet your calcium requirement with diet alone, but a vitamin D supplement is usually necessary to meet this goal.  When exposed to the sun, use a sunscreen that protects against both UVA and UVB radiation with an SPF of 30 or greater. Reapply every 2 to 3 hours or after sweating, drying off with a towel, or swimming.  Always wear a seat belt when traveling in a car. Always wear a helmet when riding a bicycle or motorcycle.

## 2021-08-23 NOTE — Progress Notes (Signed)
Medicare Annual Wellness Visit    Lauren Bryant is here for Annual Exam (Needs order for blood work also)    Assessment & Plan   Initial Medicare annual wellness visit  Doing well overall  Hyperlipidemia, unspecified hyperlipidemia type  Will check yearly blood work as listed  -     Comprehensive Metabolic Panel; Future  -     Lipid Panel; Future    Recommendations for Preventive Services Due: see orders and patient instructions/AVS.  Recommended screening schedule for the next 5-10 years is provided to the patient in written form: see Patient Instructions/AVS.     Return for Medicare Annual Wellness Visit in 1 year.     Subjective   The following acute and/or chronic problems were also addressed today:  Presents today for a medicare wellness. She fell in July and had a compression fracture. She states it has been slowly improving. She is doing physical therapy at home and her daughter reports improvement in her mobility.         Patient's complete Health Risk Assessment and screening values have been reviewed and are found in Flowsheets. The following problems were reviewed today and where indicated follow up appointments were made and/or referrals ordered.    Positive Risk Factor Screenings with Interventions:    Fall Risk:  Do you feel unsteady or are you worried about falling? : (!) yes  2 or more falls in past year?: (!) yes  Fall with injury in past year?: (!) yes   Fall Risk Interventions:    Home safety tips provided  Currently getting physical therapy            General Health and ACP:  General  In general, how would you say your health is?: Fair  In the past 7 days, have you experienced any of the following: New or Increased Pain, New or Increased Fatigue, Loneliness, Social Isolation, Stress or Anger?: (!) Yes  Select all that apply: (!) Social Isolation, Loneliness  Do you get the social and emotional support that you need?: Yes  Do you have a Living Will?: Yes    Advance Directives       Power of Attorney  Living Will ACP-Advance Directive ACP-Power of Attorney    Not on File Not on File Not on File Not on File        General Health Risk Interventions:  No Living Will: daughter will bring a copy    Health Habits/Nutrition:  Physical Activity: Inactive    Days of Exercise per Week: 0 days    Minutes of Exercise per Session: 0 min     Have you lost any weight without trying in the past 3 months?: (!) Yes  Body mass index: (!) 16.97  Have you seen the dentist within the past year?: (!) No  Health Habits/Nutrition Interventions:  Nutritional issues:  patients daughter is working on offering boost as well as meals    Hearing/Vision:  Do you or your family notice any trouble with your hearing that hasn't been managed with hearing aids?: No  Do you have difficulty driving, watching TV, or doing any of your daily activities because of your eyesight?: No  Have you had an eye exam within the past year?: (!) No  No results found.  Hearing/Vision Interventions:  Vision concerns:  patient encouraged to make appointment with his/her eye specialist     ADLs:  In the past 7 days, did you need help from others to perform  any of the following everyday activities: Eating, dressing, grooming, bathing, toileting, or walking/balance?: (!) Yes  Select all that apply: (!) Bathing  In the past 7 days, did you need help from others to take care of any of the following: Laundry, housekeeping, banking/finances, shopping, telephone use, food preparation, transportation, or taking medications?: (!) Yes  Select all that apply: [employer], Stage manager, Presenter, broadcasting, Banking/Finances, Shopping, Transportation, Telephone Use, Taking Medications  ADL Interventions:  Patient declines any further evaluation/treatment for this issue          Objective   Vitals:    08/23/21 1257   BP: 102/60   Pulse: 62   SpO2: 94%   Weight: 102 lb (46.3 kg)   Height: 5\' 5"  (1.651 m)      Body mass index is 16.97 kg/m??.      General Appearance: alert and oriented to  person, place and time, well developed and well- nourished, in no acute distress  Skin: warm and dry, no rash or erythema  Head: normocephalic and atraumatic  Eyes: pupils equal, round, and reactive to light, extraocular eye movements intact, conjunctivae normal  ENT: tympanic membrane, external ear and ear canal normal bilaterally, nose without deformity, nasal mucosa and turbinates normal without polyps  Neck: supple and non-tender without mass, no thyromegaly or thyroid nodules, no cervical lymphadenopathy  Pulmonary/Chest: clear to auscultation bilaterally- no wheezes, rales or rhonchi, normal air movement, no respiratory distress  Cardiovascular: normal rate, regular rhythm, normal S1 and S2, no murmurs, rubs, clicks, or gallops, distal pulses intact, no carotid bruits  Abdomen: soft, non-tender, non-distended, normal bowel sounds, no masses or organomegaly       Allergies   Allergen Reactions    Macrobid [Nitrofurantoin] Rash     Prior to Visit Medications    Medication Sig Taking? Authorizing Provider   trimethoprim (TRIMPEX) 100 MG tablet Take 1 tablet by mouth daily  , APRN - CNP   simvastatin (ZOCOR) 20 MG tablet Take 1 tablet by mouth nightly  Asahd Can, APRN - CNP   venlafaxine (EFFEXOR XR) 37.5 MG extended release capsule TAKE 1 CAPSULE TWICE A DAY  Taya [name], APRN - CNP   docusate sodium (COLACE) 100 MG capsule Take 100 mg by mouth 2 times daily  Historical Provider, MD   ferrous sulfate 325 (65 Fe) MG tablet Take 325 mg by mouth daily (with breakfast)  Historical Provider, MD   aspirin 81 MG tablet Take 81 mg by mouth daily  Historical Provider, MD       CareTeam (Including outside providers/suppliers regularly involved in providing care):   Patient Care Team:  Jeraldine Loots, APRN - CNP as PCP - General (Nurse Practitioner Family)  [name], APRN - CNP as PCP - [facility] Empaneled Provider  [facility], RN as Care Transitions Nurse     Reviewed and updated this  visit:  Tobacco   Allergies   Meds   Problems   Med Hx   Surg Hx   Soc Hx   Fam Hx

## 2021-08-24 LAB — COMPREHENSIVE METABOLIC PANEL
ALT: 7 U/L — ABNORMAL LOW (ref 10–40)
AST: 14 U/L — ABNORMAL LOW (ref 15–37)
Albumin/Globulin Ratio: 2.6 — ABNORMAL HIGH (ref 1.1–2.2)
Albumin: 4.2 g/dL (ref 3.4–5.0)
Alkaline Phosphatase: 45 U/L (ref 40–129)
Anion Gap: 12 (ref 3–16)
BUN: 14 mg/dL (ref 7–20)
CO2: 25 mmol/L (ref 21–32)
Calcium: 8.9 mg/dL (ref 8.3–10.6)
Chloride: 103 mmol/L (ref 99–110)
Creatinine: 0.8 mg/dL (ref 0.6–1.2)
GFR African American: >60 (ref >60)
GFR Non-African American: >60 (ref >60)
Glucose: 118 mg/dL — ABNORMAL HIGH (ref 70–99)
Potassium: 4.2 mmol/L (ref 3.5–5.1)
Sodium: 140 mmol/L (ref 136–145)
Total Bilirubin: 0.3 mg/dL (ref 0.0–1.0)
Total Protein: 5.8 g/dL — ABNORMAL LOW (ref 6.4–8.2)

## 2021-08-24 LAB — LIPID PANEL
Cholesterol, Total: 181 mg/dL (ref 0–199)
HDL: 68 mg/dL — ABNORMAL HIGH (ref 40–60)
LDL Calculated: 94 mg/dL (ref <100)
Triglycerides: 94 mg/dL (ref 0–150)
VLDL Cholesterol Calculated: 19 mg/dL

## 2021-09-09 NOTE — Telephone Encounter (Signed)
-  Pt's daughter Kendal Hymen, dropped off "[facility]isted [facility] Physician Report" to be completed after 09/13/21.  -Pt will be admitted to this Assisted Living home on 10/13/21 & form needs to be completed 30 days before entering this facility.  -Pt's Last Physical was 08/23/21.  -Please contact Daughter Kendal Hymen when form is ready for pick up.    ---Blank form scanned---

## 2021-09-12 NOTE — Telephone Encounter (Signed)
I have not seen the form

## 2021-09-12 NOTE — Telephone Encounter (Signed)
Please advise.  Do you have the paper.

## 2021-09-13 NOTE — Telephone Encounter (Signed)
Paperwork on [employer]. MUST be dated 09/14/21 or later.

## 2021-09-13 NOTE — Telephone Encounter (Incomplete Revision)
Paperwork was scanned into media  9/16 and patients needs this form completed asap. Needs to know if she needs an appt. Contact daughter Natasha Mead [phone]

## 2021-09-13 NOTE — Telephone Encounter (Signed)
Paperwork was scanned into media  9/19 and patients needs this form completed asap. Needs to know if she needs an appt. Contact daughter Natasha Mead [phone]

## 2021-09-14 NOTE — Telephone Encounter (Signed)
Form has been filled out but it looks like they require a TB test. If she has not had this done already then she can be scheduled for a TB test.

## 2021-09-15 NOTE — Telephone Encounter (Signed)
Schedules on 09/21/21 for TB placement.

## 2021-09-15 NOTE — Telephone Encounter (Signed)
Pt daughter notified. She will call back to schedule nurse visit for TB placement either, 9/26. 9/27, or 9/28

## 2021-09-21 ENCOUNTER — Encounter: Admit: 2021-09-21 | Payer: MEDICARE | Primary: Registered Nurse

## 2021-09-21 DIAGNOSIS — Z111 Encounter for screening for respiratory tuberculosis: Secondary | ICD-10-CM

## 2021-09-22 DIAGNOSIS — R42 Dizziness and giddiness: Principal | ICD-10-CM

## 2021-09-22 NOTE — ED Provider Notes (Signed)
[facility]  ED  EMERGENCY DEPARTMENT ENCOUNTER      Pt Name: Lauren Bryant  MRN: [id number]  Birthdate 1924-04-30  Date of evaluation: 09/22/2021  Provider: Vela Prose, MD    CHIEF COMPLAINT       Chief Complaint   Patient presents with    Dizziness     Did not want to come to hospital states she is dizzy          HISTORY OF PRESENT ILLNESS   (Location/Symptom, Timing/Onset, Context/Setting, Quality, Duration, Modifying Factors, Severity)  Note limiting factors.   Lauren Bryant is a 87 y.o. female with past medical history of frequent UTIs now improved on outpatient trimethoprim as well as hyperlipidemia here today for reported dizziness.    The patient reportedly alerted to her medical bracelet stating she was feeling dizzy.  When they arrived, she notes her dizziness improved and she was feeling much better.  She has she states she does not want to be in the emergency department and wants nothing done right now.  States she feels fine.  Note she has a history of feeling dizzy on occasion and that is not new but denies any numbness, tingling or weakness.  No headache.  No fall or injury.  No fevers or chills.  No chest pain or shortness of breath.  No dysuria.    Family ultimately arrived and states she has a history of chronic dizziness that is a chronic complaint.  They note that the caretaker for her did not show up this evening and I think that she may have simply gotten lonely and hit her call light to have someone come sit with her but after complaining of dizziness she was brought in by EMS.  The family wanted no further work-up.  They feel that the patient is at her baseline.  They have no concern for underlying medical comorbidities at present.  They wanted no further work-up performed    HPI    Nursing Notes were reviewed.    REVIEW OF SYSTEMS    (2-9 systems for level 4, 10 or more for level 5)     Review of Systems    Please see HPI for pertinent positive and negative review of system  findings. A full 10 system ROS was performed and otherwise negative.        PAST MEDICAL HISTORY     Past Medical History:   Diagnosis Date    Compression fracture of body of thoracic vertebra (HCC)     Frequent UTI     Hyperlipidemia     TIA 2015         SURGICAL HISTORY       Past Surgical History:   Procedure Laterality Date    HYSTERECTOMY (CERVIX STATUS UNKNOWN)      TUBAL LIGATION           CURRENT MEDICATIONS       Discharge Medication List as of 09/23/2021  1:53 AM        CONTINUE these medications which have NOT CHANGED    Details   ALPRAZolam (XANAX) 0.25 MG tablet Take 0.25 mg by mouth 3 times daily as needed for Anxiety or Sleep.Historical Med      trimethoprim (TRIMPEX) 100 MG tablet Take 1 tablet by mouth daily, Disp-90 tablet, R-1Normal      simvastatin (ZOCOR) 20 MG tablet Take 1 tablet by mouth nightly, Disp-14 tablet, R-0Normal      venlafaxine (EFFEXOR XR)  37.5 MG extended release capsule TAKE 1 CAPSULE TWICE A DAY, Disp-180 capsule, R-3Normal      docusate sodium (COLACE) 100 MG capsule Take 100 mg by mouth 2 times dailyHistorical Med      ferrous sulfate 325 (65 Fe) MG tablet Take 325 mg by mouth daily (with breakfast)Historical Med      aspirin 81 MG tablet Take 81 mg by mouth daily             ALLERGIES     Macrobid [nitrofurantoin]    FAMILY HISTORY     History reviewed. No pertinent family history.       SOCIAL HISTORY       Social History     Socioeconomic History    Marital status: Widowed     Spouse name: None    Number of children: None    Years of education: None    Highest education level: None   Tobacco Use    Smoking status: Former     Packs/day: 0.50     Types: Cigarettes     Quit date: 03/01/2020     Years since quitting: 1.5    Smokeless tobacco: Never   Substance and Sexual Activity    Alcohol use: No     Social Determinants of Health     Physical Activity: Inactive    Days of Exercise per Week: 0 days    Minutes of Exercise per Session: 0 min       SCREENINGS               PHYSICAL  EXAM    (up to 7 for level 4, 8 or more for level 5)     ED Triage Vitals [09/22/21 2353]   BP Temp Temp Source Heart Rate Resp SpO2 Height Weight   113/65 98 ??F (36.7 ??C) Oral 58 16 97 % -- --       Physical Exam    General appearance:  Cooperative.  Elderly female in no acute distress.   Skin:  Warm. Dry.   Eye:  Extraocular movements intact.     Ears, nose, mouth and throat:  Oral mucosa moist,  Neck:  Trachea midline.     Heart:  Regular rate and rhythm  Perfusion:  intact  Respiratory:  Respirations nonlabored. Lungs clear to auscultation bilaterally.     Abdominal:   Non distended.  Nontender  Neurological:  Alert and oriented x 3.  Moves all extremities spontaneously  Musculoskeletal:   Normal ROM, no deformities          Psychiatric:  Normal mood      DIAGNOSTIC RESULTS       Labs Reviewed   BASIC METABOLIC PANEL W/ REFLEX TO MG FOR LOW K - Abnormal; Notable for the following components:       Result Value    Glucose 133 (*)     BUN 22 (*)     All other components within normal limits   CBC WITH AUTO DIFFERENTIAL - Abnormal; Notable for the following components:    RBC 3.12 (*)     Hemoglobin 9.0 (*)     Hematocrit 27.9 (*)     RDW 16.0 (*)     MPV 11.7 (*)     Lymphocytes Absolute 0.6 (*)     All other components within normal limits   CULTURE, BLOOD 1   CULTURE, BLOOD 2   TROPONIN   URINALYSIS WITH REFLEX TO CULTURE   LACTIC ACID  Interpretation per the Radiologist below, if obtained/available at the time of this note:    XR CHEST PORTABLE   Final Result   Patchy bilateral airspace disease suggestive of multifocal inflammatory   process or infection.  Question small left effusion.             All other labs/imaging were within normal range or not returned as of this dictation.    EMERGENCY DEPARTMENT COURSE and DIFFERENTIAL DIAGNOSIS/MDM:   Vitals:    Vitals:    09/22/21 2353 09/23/21 0144   BP: 113/65    Pulse: 58 93   Resp: 16 18   Temp: 98 ??F (36.7 ??C)    TempSrc: Oral    SpO2: 97% 99%       EKG:  Sinus rhythm with PACs rate of 89 bpm rightward axis and incomplete right bundle branch block.  No ST elevation.  Similar to prior.    Patient presented emergency department today for reported dizziness.  No complaints at present.  Actually wants no work-up performed.  Vital signs stable.  I did order laboratory studies which show no abnormality but a chest x-ray was concerning for possible pulmonary infiltrates.  Patient is resting comfortably.  Clear lung sounds.  Breathing easy.  No oxygen requirement.  At this point the patient's daughter arrived stating that she has no concern for any underlying pathology for her mother.  They note that she has been in her usual state of care.  They felt that if her caretaker was there she likely would not have been brought to the hospital and they wanted no further work-up performed including they did not want to wait for urinalysis to be obtained.  They note that she has been urinating normally and has had no foul-smelling urine and is on trimethoprim for preventive measures.  They were comfortable taking the patient home and did not want to wait for further work-up.  I did explain chest x-ray findings which they made note of an state that she has a follow-up appointment with her PCP tomorrow already scheduled and will discuss it with him    I, Vela Prose, M.D., am the primary clinician of record.     MDM      CONSULTS     None    Critical Care:   None    REASSESSMENT          PROCEDURE     Unless otherwise noted below, none     Procedures      FINAL IMPRESSION      1. Dizziness            DISPOSITION/PLAN   DISPOSITION Decision To Discharge 09/23/2021 01:37:43 AM        PATIENT REFERRED TO:  [name], APRN - CNP  [address]  [place] [address]  [phone]    Schedule an appointment as soon as possible for a visit       DISCHARGE MEDICATIONS:  Discharge Medication List as of 09/23/2021  1:53 AM        Controlled Substances Monitoring:     No flowsheet data  found.    (Please note that portions of this note were completed with a voice recognition program.  Efforts were made to edit the dictations but occasionally words are mis-transcribed.)    Vela Prose, MD (electronically signed)  Attending Emergency Physician            Sharol Harness, MD  09/23/21 [phone]

## 2021-09-23 ENCOUNTER — Emergency Department: Admit: 2021-09-23 | Payer: MEDICARE | Primary: Registered Nurse

## 2021-09-23 ENCOUNTER — Encounter: Admit: 2021-09-23 | Payer: MEDICARE | Primary: Registered Nurse

## 2021-09-23 ENCOUNTER — Inpatient Hospital Stay
Admit: 2021-09-23 | Discharge: 2021-09-23 | Disposition: A | Discharge status: Home / Self Care | Payer: MEDICARE | Attending: Emergency Medicine

## 2021-09-23 ENCOUNTER — Encounter

## 2021-09-23 LAB — CBC WITH AUTO DIFFERENTIAL
Basophils %: 1 %
Basophils Absolute: 0.1 10*3/uL (ref 0.0–0.2)
Eosinophils %: 1.8 %
Eosinophils Absolute: 0.1 10*3/uL (ref 0.0–0.6)
Hematocrit: 27.9 % — ABNORMAL LOW (ref 36.0–48.0)
Hemoglobin: 9 g/dL — ABNORMAL LOW (ref 12.0–16.0)
Lymphocytes %: 8.3 %
Lymphocytes Absolute: 0.6 10*3/uL — ABNORMAL LOW (ref 1.0–5.1)
MCH: 28.8 pg (ref 26.0–34.0)
MCHC: 32.1 g/dL (ref 31.0–36.0)
MCV: 89.7 fL (ref 80.0–100.0)
MPV: 11.7 fL — ABNORMAL HIGH (ref 5.0–10.5)
Monocytes %: 11.4 %
Monocytes Absolute: 0.8 10*3/uL (ref 0.0–1.3)
Neutrophils %: 77.5 %
Neutrophils Absolute: 5.3 10*3/uL (ref 1.7–7.7)
Platelets: 158 10*3/uL (ref 135–450)
RBC: 3.12 M/uL — ABNORMAL LOW (ref 4.00–5.20)
RDW: 16 % — ABNORMAL HIGH (ref 12.4–15.4)
WBC: 6.8 10*3/uL (ref 4.0–11.0)

## 2021-09-23 LAB — EKG 12-LEAD
Atrial Rate: 89 {beats}/min
P Axis: 48 degrees
P-R Interval: 136 ms
Q-T Interval: 392 ms
QRS Duration: 106 ms
QTc Calculation (Bazett): 476 ms
R Axis: 90 degrees
T Axis: 30 degrees
Ventricular Rate: 89 {beats}/min

## 2021-09-23 LAB — BASIC METABOLIC PANEL W/ REFLEX TO MG FOR LOW K
Anion Gap: 9 (ref 3–16)
BUN: 22 mg/dL — ABNORMAL HIGH (ref 7–20)
CO2: 26 mmol/L (ref 21–32)
Calcium: 8.6 mg/dL (ref 8.3–10.6)
Chloride: 102 mmol/L (ref 99–110)
Creatinine: 0.7 mg/dL (ref 0.6–1.2)
GFR African American: >60 (ref >60)
GFR Non-African American: >60 (ref >60)
Glucose: 133 mg/dL — ABNORMAL HIGH (ref 70–99)
Potassium reflex Magnesium: 4.1 mmol/L (ref 3.5–5.1)
Sodium: 137 mmol/L (ref 136–145)

## 2021-09-23 LAB — MANTOUX TESTING
Induration: 0
TB Skin Test: NEGATIVE

## 2021-09-23 LAB — TROPONIN: Troponin: <0.01 ng/mL (ref <0.01)

## 2021-09-23 NOTE — ED Notes (Signed)
Pt and daughter elected to not have BC done, LIP agreeable with plan Pt to be discharged.      Janeece Fitting, RN  09/23/21 0200

## 2021-09-24 LAB — URINALYSIS WITH REFLEX TO CULTURE
Bilirubin Urine: NEGATIVE
Blood, Urine: NEGATIVE
Glucose, Ur: NEGATIVE mg/dL
Ketones, Urine: NEGATIVE mg/dL
Nitrite, Urine: POSITIVE — AB
Specific Gravity, UA: 1.02 (ref 1.005–1.030)
Urobilinogen, Urine: 0.2 E.U./dL (ref <2.0)
pH, UA: 6 (ref 5.0–8.0)

## 2021-09-24 LAB — MICROSCOPIC URINALYSIS
Epithelial Cells, UA: 3 /HPF (ref 0–5)
Hyaline Casts, UA: 2 /LPF (ref 0–8)
RBC, UA: 1 /HPF (ref 0–4)
WBC, UA: 87 /HPF — ABNORMAL HIGH (ref 0–5)

## 2021-09-26 ENCOUNTER — Inpatient Hospital Stay: Payer: MEDICARE | Primary: Registered Nurse

## 2021-09-26 ENCOUNTER — Inpatient Hospital Stay: Admit: 2021-09-26 | Payer: MEDICARE | Primary: Registered Nurse

## 2021-09-26 DIAGNOSIS — R9389 Abnormal findings on diagnostic imaging of other specified body structures: Secondary | ICD-10-CM

## 2021-09-26 LAB — CULTURE, URINE: Urine Culture, Routine: >100000

## 2021-09-26 MED ORDER — CEPHALEXIN 500 MG PO CAPS
500 MG | ORAL_CAPSULE | Freq: Two times a day (BID) | ORAL | 0 refills | Status: DC
Start: 2021-09-26 — End: 2021-10-13

## 2021-09-26 NOTE — Other (Signed)
Spoke with patient's daughter and she already picked up Rx

## 2021-09-30 NOTE — Telephone Encounter (Signed)
She may need some fluids, my suggestion is the ER.

## 2021-09-30 NOTE — Telephone Encounter (Signed)
Lauren Bryant was informed and Lauren Bryant stated that the pt is having symptoms. Please advise?

## 2021-09-30 NOTE — Telephone Encounter (Signed)
Teri informed.

## 2021-09-30 NOTE — Telephone Encounter (Signed)
Is patient symptomatic such as dizziness or lightheaded? Try pushing fluids and recheck blood pressure

## 2021-09-30 NOTE — Telephone Encounter (Signed)
[facility] reporting that patient blood pressure today is 73/43.  Pt is being discharged from physical therapy today 09/30/21.  Pt does have a DNR and has caregivers with her.  Contact name is Terri at [phone]

## 2021-10-06 ENCOUNTER — Emergency Department: Admit: 2021-10-06 | Payer: MEDICARE | Primary: Registered Nurse

## 2021-10-06 ENCOUNTER — Inpatient Hospital Stay
Admission: EM | Admit: 2021-10-06 | Discharge: 2021-10-14 | Disposition: A | Discharge status: Skilled Nursing Facility | Payer: Medicare Other | Admitting: Internal Medicine

## 2021-10-06 DIAGNOSIS — K921 Melena: Secondary | ICD-10-CM

## 2021-10-06 LAB — TYPE AND SCREEN
ABO/Rh: A POS
Antibody Screen: NEGATIVE

## 2021-10-06 LAB — COMPREHENSIVE METABOLIC PANEL W/ REFLEX TO MG FOR LOW K
ALT: 10 U/L (ref 10–40)
AST: 15 U/L (ref 15–37)
Albumin/Globulin Ratio: 1.7 (ref 1.1–2.2)
Albumin: 3.6 g/dL (ref 3.4–5.0)
Alkaline Phosphatase: 55 U/L (ref 40–129)
Anion Gap: 7 (ref 3–16)
BUN: 27 mg/dL — ABNORMAL HIGH (ref 7–20)
CO2: 26 mmol/L (ref 21–32)
Calcium: 8.9 mg/dL (ref 8.3–10.6)
Chloride: 104 mmol/L (ref 99–110)
Creatinine: 0.7 mg/dL (ref 0.6–1.2)
GFR African American: >60 (ref >60)
GFR Non-African American: >60 (ref >60)
Glucose: 126 mg/dL — ABNORMAL HIGH (ref 70–99)
Potassium reflex Magnesium: 4.2 mmol/L (ref 3.5–5.1)
Sodium: 137 mmol/L (ref 136–145)
Total Bilirubin: 0.3 mg/dL (ref 0.0–1.0)
Total Protein: 5.7 g/dL — ABNORMAL LOW (ref 6.4–8.2)

## 2021-10-06 LAB — CBC WITH AUTO DIFFERENTIAL
Basophils %: 1 %
Basophils Absolute: 0.1 10*3/uL (ref 0.0–0.2)
Eosinophils %: 1.6 %
Eosinophils Absolute: 0.1 10*3/uL (ref 0.0–0.6)
Hematocrit: 19.3 % — CL (ref 36.0–48.0)
Hemoglobin: 6 g/dL — CL (ref 12.0–16.0)
Lymphocytes %: 7.8 %
Lymphocytes Absolute: 0.6 10*3/uL — ABNORMAL LOW (ref 1.0–5.1)
MCH: 27.8 pg (ref 26.0–34.0)
MCHC: 30.9 g/dL — ABNORMAL LOW (ref 31.0–36.0)
MCV: 90 fL (ref 80.0–100.0)
MPV: 10.7 fL — ABNORMAL HIGH (ref 5.0–10.5)
Monocytes %: 8.7 %
Monocytes Absolute: 0.7 10*3/uL (ref 0.0–1.3)
Neutrophils %: 80.9 %
Neutrophils Absolute: 6.5 10*3/uL (ref 1.7–7.7)
Platelets: 240 10*3/uL (ref 135–450)
RBC: 2.14 M/uL — ABNORMAL LOW (ref 4.00–5.20)
RDW: 16.9 % — ABNORMAL HIGH (ref 12.4–15.4)
WBC: 8 10*3/uL (ref 4.0–11.0)

## 2021-10-06 LAB — EKG 12-LEAD
Atrial Rate: 267 {beats}/min
Q-T Interval: 394 ms
QRS Duration: 106 ms
QTc Calculation (Bazett): 479 ms
R Axis: 87 degrees
T Axis: 52 degrees
Ventricular Rate: 89 {beats}/min

## 2021-10-06 LAB — AMMONIA: Ammonia: <10 umol/L — ABNORMAL LOW (ref 11–51)

## 2021-10-06 LAB — BLOOD GAS, VENOUS
Base Excess, Ven: -0.7 mmol/L (ref <=3.0)
Carboxyhemoglobin: 2.8 % — ABNORMAL HIGH (ref 0.0–1.5)
HCO3, Venous: 23.9 mmol/L (ref 23.0–29.0)
MetHgb, Ven: 1 % (ref <1.5)
O2 Sat, Ven: 62 %
TC02 (Calc), Ven: 25 mmol/L
pCO2, Ven: 38.9 mmHg — ABNORMAL LOW (ref 40.0–50.0)
pH, Ven: 7.407 (ref 7.350–7.450)
pO2, Ven: 34.6 mmHg (ref 25.0–40.0)

## 2021-10-06 LAB — COVID-19 & INFLUENZA COMBO
INFLUENZA A: NOT DETECTED
INFLUENZA B: NOT DETECTED
SARS-CoV-2 RNA, RT PCR: NOT DETECTED

## 2021-10-06 LAB — LACTATE, SEPSIS: Lactic Acid, Sepsis: 1 mmol/L (ref 0.4–1.9)

## 2021-10-06 LAB — PREPARE RBC (CROSSMATCH): Dispense Status Blood Bank: TRANSFUSED

## 2021-10-06 LAB — TROPONIN: Troponin: 0.03 ng/mL — ABNORMAL HIGH (ref <0.01)

## 2021-10-06 LAB — SAMPLE POSSIBLE BLOOD BANK TESTING

## 2021-10-06 LAB — BLOOD OCCULT STOOL SCREEN #1: Occult Blood Screening: POSITIVE — AB

## 2021-10-06 MED ORDER — PANTOPRAZOLE SODIUM 40 MG IV SOLR
40 MG | Freq: Once | INTRAVENOUS | Status: AC
Start: 2021-10-06 — End: 2021-10-06
  Administered 2021-10-06: 17:00:00 80 mg via INTRAVENOUS

## 2021-10-06 MED ORDER — SODIUM CHLORIDE 0.9 % IV SOLN
0.9 % | INTRAVENOUS | Status: DC | PRN
Start: 2021-10-06 — End: 2021-10-14
  Administered 2021-10-12 (×2): via INTRAVENOUS

## 2021-10-06 MED ORDER — NORMAL SALINE FLUSH 0.9 % IV SOLN
0.9 % | INTRAVENOUS | Status: DC | PRN
Start: 2021-10-06 — End: 2021-10-14

## 2021-10-06 MED ORDER — ACETAMINOPHEN 325 MG PO TABS
325 MG | Freq: Four times a day (QID) | ORAL | Status: DC | PRN
Start: 2021-10-06 — End: 2021-10-07

## 2021-10-06 MED ORDER — SODIUM CHLORIDE (PF) 0.9 % IJ SOLN
0.9 % | Freq: Every day | INTRAMUSCULAR | Status: DC
Start: 2021-10-06 — End: 2021-10-10
  Administered 2021-10-09 – 2021-10-10 (×2): 40 mg via INTRAVENOUS

## 2021-10-06 MED ORDER — ONDANSETRON 4 MG PO TBDP
4 MG | Freq: Three times a day (TID) | ORAL | Status: DC | PRN
Start: 2021-10-06 — End: 2021-10-14

## 2021-10-06 MED ORDER — NORMAL SALINE FLUSH 0.9 % IV SOLN
0.9 % | Freq: Two times a day (BID) | INTRAVENOUS | Status: DC
Start: 2021-10-06 — End: 2021-10-14
  Administered 2021-10-07 – 2021-10-13 (×10): 10 mL via INTRAVENOUS

## 2021-10-06 MED ORDER — ENOXAPARIN SODIUM 30 MG/0.3ML IJ SOSY
30 MG/0.3ML | Freq: Every day | INTRAMUSCULAR | Status: DC
Start: 2021-10-06 — End: 2021-10-14
  Administered 2021-10-07 – 2021-10-14 (×8): 30 mg via SUBCUTANEOUS

## 2021-10-06 MED ORDER — SODIUM CHLORIDE 0.9 % IV SOLN
0.9 % | INTRAVENOUS | Status: DC | PRN
Start: 2021-10-06 — End: 2021-10-10

## 2021-10-06 MED ORDER — NORMAL SALINE FLUSH 0.9 % IV SOLN
0.9 % | INTRAVENOUS | Status: DC | PRN
Start: 2021-10-06 — End: 2021-10-10

## 2021-10-06 MED ORDER — ACETAMINOPHEN 325 MG RE SUPP
325 MG | Freq: Four times a day (QID) | RECTAL | Status: DC | PRN
Start: 2021-10-06 — End: 2021-10-14

## 2021-10-06 MED ORDER — SODIUM CHLORIDE 0.9 % IV BOLUS
0.9 % | Freq: Once | INTRAVENOUS | Status: AC
Start: 2021-10-06 — End: 2021-10-06
  Administered 2021-10-06: 16:00:00 1000 mL via INTRAVENOUS

## 2021-10-06 MED ORDER — PANTOPRAZOLE SODIUM 40 MG IV SOLR
40 MG | INTRAVENOUS | Status: AC
Start: 2021-10-06 — End: 2021-10-09
  Administered 2021-10-06 – 2021-10-08 (×4): 8 mg/h via INTRAVENOUS

## 2021-10-06 MED ORDER — ACETAMINOPHEN 325 MG RE SUPP
325 MG | Freq: Four times a day (QID) | RECTAL | Status: DC | PRN
Start: 2021-10-06 — End: 2021-10-07

## 2021-10-06 MED ORDER — SODIUM CHLORIDE 0.9 % IV SOLN
0.9 % | INTRAVENOUS | Status: DC | PRN
Start: 2021-10-06 — End: 2021-10-14

## 2021-10-06 MED ORDER — VENLAFAXINE HCL ER 37.5 MG PO CP24
37.5 MG | Freq: Two times a day (BID) | ORAL | Status: DC
Start: 2021-10-06 — End: 2021-10-14
  Administered 2021-10-08 – 2021-10-14 (×4): 37.5 mg via ORAL

## 2021-10-06 MED ORDER — ONDANSETRON HCL 4 MG/2ML IJ SOLN
4 MG/2ML | Freq: Four times a day (QID) | INTRAMUSCULAR | Status: DC | PRN
Start: 2021-10-06 — End: 2021-10-14

## 2021-10-06 MED ORDER — ACETAMINOPHEN 325 MG PO TABS
325 MG | Freq: Four times a day (QID) | ORAL | Status: DC | PRN
Start: 2021-10-06 — End: 2021-10-14

## 2021-10-06 MED ORDER — NORMAL SALINE FLUSH 0.9 % IV SOLN
0.9 % | Freq: Two times a day (BID) | INTRAVENOUS | Status: DC
Start: 2021-10-06 — End: 2021-10-10
  Administered 2021-10-08 – 2021-10-10 (×3): 10 mL via INTRAVENOUS

## 2021-10-06 MED ORDER — POLYETHYLENE GLYCOL 3350 17 G PO PACK
17 g | Freq: Every day | ORAL | Status: DC | PRN
Start: 2021-10-06 — End: 2021-10-14

## 2021-10-06 MED FILL — PROTONIX 40 MG IV SOLR: 40 MG | INTRAVENOUS | Qty: 40

## 2021-10-06 MED FILL — PROTONIX 40 MG IV SOLR: 40 MG | INTRAVENOUS | Qty: 80

## 2021-10-06 NOTE — ED Notes (Signed)
Perfect serve message sent to Dr. Sterling Big to please place admission orders for patient.      Dolores Frame, RN  10/06/21 1550

## 2021-10-06 NOTE — ED Notes (Signed)
Patients granddaughter at bedside.      Dolores Frame, RN  10/06/21 1205

## 2021-10-06 NOTE — ED Notes (Signed)
Report called to Velva Harman  Mews score recorded.      Alvino Chapel, RN  10/06/21 [phone]

## 2021-10-06 NOTE — ED Notes (Signed)
Lab called regarding status of blood for transfusion. Spoke to [facility] who states "I didn't know about it but I can see the order so I will set it up now".     Dolores Frame, RN  10/06/21 1348

## 2021-10-06 NOTE — ED Notes (Signed)
Dr. Sterling Big at bedside.      Dolores Frame, RN  10/06/21 1322

## 2021-10-06 NOTE — Care Coordination-Inpatient (Signed)
CASE MANAGEMENT INITIAL ASSESSMENT      Reviewed chart and completed assessment with patient and granddaughter  Family present: granddaughter  Explained Case Management role/services.yes    Primary contact information:daughter, [facility] Decision Maker :   Primary Decision Maker: Sirk,[facility]) - Child [phone]          Can this person be reached and be able to respond quickly, such as within a few minutes or hours? Yes      Admit date/status:10/06/21  Diagnosis:AMS, anemia   Is this a Readmission?:  No      Insurance:medicare   Precert required for SNF: No       3 night stay required: waived due to COVID    Living arrangements, Adls, care needs, prior to admission:[place], has private duty home care 8 hours a day and [employer].  Needs assist with most ADLs    Durable Medical Equipment at home:  Walker_x_Cane__RTS__ BSC__Shower Chair__  02__ HHN__ CPAP__  BiPap__  Hospital Bed__ W/C___ Other_____    Services in the home and/or outpatient, prior to admission:private duty 8 hours a day and [facility]    Current PCP:Dr. Teola Bradley                                Medications:yes Prescription coverage?Yes Will pt require financial assistance with medications No     Transportation needs: car     Dialysis Facility (if applicable)   Name:  Address:  Dialysis Schedule:  Phone:  Fax:    PT/OT recs:    Hospital Exemption Notification (HEN):    Barriers to discharge:medical complications    Plan/comments:Referred to patient for d/c planning.  Spoke to granddaughter, patient sleeping.  Patient is a 87 year old female admitted for anemia and AMS.  Per family, patient with decline over the last several months and patient moving in one week to Assisted Living, granddaughter does not know name of facility.  States daughter arranged.  Discussed possible SNF vs HC.  Recommend PT/OT consults.  Case management to follow for d/c needs.  Electronically signed by Evert Kohl, MSW LISW-S on  10/06/2021 at 1:34 PM      ECOC on chart for MD signature

## 2021-10-06 NOTE — ED Notes (Signed)
Report received from Mariah rn      Allin Frix, RN  10/06/21 1611

## 2021-10-06 NOTE — Plan of Care (Signed)
Received ED page for admission.  Sent to Dr. [place] admitting hospitalist.

## 2021-10-06 NOTE — ED Notes (Signed)
Tele 204 @ 1721

## 2021-10-06 NOTE — H&P (Signed)
Hospital Medicine History & Physical      PCP: [name], APRN - CNP    Date of Admission: 10/06/2021    Date of Service: Pt seen/examined on 10/06/21 and Admitted to Inpatient with expected LOS greater than two midnights due to medical therapy.     Chief Complaint:  unresponsive      History Of Present Illness:   87 y.o. female dementia who presented to [facility] with unresponsiveness. Pt was last normal on Monday and in the past 2 days, was noted to have some inc'd irritability and dec'd po intake. Pt was at the breakfast table and had period of unresponsivenss and caregiver called EMS. Pt has been having chronic dark stools of late but no nsaid use or hematemesis.  -of note, pt had fall during summer time and diagnosed with compression fx    ER course: noted anemia, +FOBT, 1u prbc ordered    Past Medical History:          Diagnosis Date    Compression fracture of body of thoracic vertebra (HCC)     Frequent UTI     Hyperlipidemia     TIA 2015       Past Surgical History:          Procedure Laterality Date    HYSTERECTOMY (CERVIX STATUS UNKNOWN)      TUBAL LIGATION         Medications Prior to Admission:      Prior to Admission medications    Medication Sig Start Date End Date Taking? Authorizing Provider   ALPRAZolam (XANAX) 0.25 MG tablet Take 0.25 mg by mouth 3 times daily as needed for Anxiety or Sleep.    Historical Provider, MD   trimethoprim (TRIMPEX) 100 MG tablet Take 1 tablet by mouth daily 05/03/21   [name], APRN - CNP   simvastatin (ZOCOR) 20 MG tablet Take 1 tablet by mouth nightly 05/02/21   [name], APRN - CNP   venlafaxine (EFFEXOR XR) 37.5 MG extended release capsule TAKE 1 CAPSULE TWICE A DAY 04/06/21   Taya Jeraldine Loots, APRN - CNP   docusate sodium (COLACE) 100 MG capsule Take 100 mg by mouth 2 times daily    Historical Provider, MD   ferrous sulfate 325 (65 Fe) MG tablet Take 325 mg by mouth daily (with breakfast)    Historical Provider, MD   aspirin 81 MG tablet  Take 81 mg by mouth daily    Historical Provider, MD       Allergies:  Macrobid [nitrofurantoin]    Social History:      The patient currently lives at home    TOBACCO:   reports that she quit smoking about 19 months ago. Her smoking use included cigarettes. She smoked an average of .5 packs per day. She has never used smokeless tobacco.  ETOH:   reports no history of alcohol use.  E-cigarette/Vaping       Questions Responses    E-cigarette/Vaping Use     Start Date     Passive Exposure     Quit Date     Counseling Given     Comments               Family History:       Reviewed and negative in regards to presenting illness/complaint.    History reviewed. No pertinent family history.    REVIEW OF SYSTEMS COMPLETED:   Pertinent positives as noted in the HPI. All other systems  reviewed and negative.    PHYSICAL EXAM PERFORMED:    BP 113/65    Pulse 100    Temp 99 ??F (37.2 ??C) (Oral)    Resp 18    Wt 102 lb (46.3 kg)    LMP  (LMP Unknown)    SpO2 96%    BMI 16.97 kg/m??     General appearance:  No apparent distress, appears stated age and cooperative.  HEENT:  Normal cephalic, atraumatic without obvious deformity. Pupils equal, round, and reactive to light.  Extra ocular muscles intact. Conjunctivae/corneas clear.  Neck: Supple, with full range of motion. No jugular venous distention. Trachea midline.  Respiratory:  Normal respiratory effort. Clear to auscultation, bilaterally without Rales/Wheezes/Rhonchi.  Cardiovascular:  Regular rate and rhythm with normal S1/S2 without murmurs, rubs or gallops.  Abdomen: Soft, non-tender, non-distended with normal bowel sounds.  Musculoskeletal:  No clubbing, cyanosis or edema bilaterally.  Full range of motion without deformity.  Skin: Skin color, texture, turgor normal.  No rashes or lesions.  Neurologic:  Neurovascularly intact without any focal sensory/motor deficits. Cranial nerves: II-XII intact, grossly non-focal.  Psychiatric:  Alert and oriented x1, thought content not  appropriate, not normal insight  Capillary Refill: Brisk,3 seconds, normal  Peripheral Pulses: +2 palpable, equal bilaterally       Labs:     Recent Labs     10/06/21  1059 10/06/21  2241 10/07/21  0618   WBC 8.0  --  11.1*   HGB 6.0* 7.7* 7.3*   HCT 19.3* 25.0* 24.4*   PLT 240  --  223     Recent Labs     10/06/21  1059 10/07/21  0618   NA 137 140   K 4.2 4.2   CL 104 103   CO2 26 24   BUN 27* 18   CREATININE 0.7 0.6   CALCIUM 8.9 8.7     Recent Labs     10/06/21  1059   AST 15   ALT 10   BILITOT 0.3   ALKPHOS 55     No results for input(s): INR in the last 72 hours.  Recent Labs     10/06/21  1059   TROPONINI 0.03*       Urinalysis:      Lab Results   Component Value Date/Time    NITRU POSITIVE 09/23/2021 03:07 PM    WBCUA 87 09/24/2021 03:21 AM    BACTERIA 4+ 09/24/2021 03:21 AM    RBCUA 1 09/24/2021 03:21 AM    BLOODU Negative 09/23/2021 03:07 PM    SPECGRAV 1.020 09/23/2021 03:07 PM    GLUCOSEU Negative 09/23/2021 03:07 PM       Radiology:     EKG:  I have reviewed the EKG with the following interpretation: sinus, rate of 90, nl axis, PACs, incompolete rbbb    CT HEAD WO CONTRAST   Final Result   No acute intracranial abnormality.  There is no change from prior examination.         XR CHEST PORTABLE   Final Result   1.  No acute abnormality.             Consults:    IP CONSULT TO GI    ASSESSMENT:    Active Hospital Problems    Diagnosis Date Noted    GIB (gastrointestinal bleeding) [K92.2] 10/06/2021     Priority: Medium         PLAN:    Possible GIB - POA, +  FOBT  -GI consulted  -PPI ggt  -Trended h/h  -1u prbc in ER    Abn EKG- concerns for atrial flutter  -on tele    Acute encephalopathy- with baseline dementia  -check UA    Suspected Acute blood loss anemia- due to possible GIB  -1u prbc given 10/14  -monitored h/h    HLD-held statin  Chronic uti- held trimpex  MISC- Held asa    DVT Prophylaxis: scd  Diet: Diet NPO  Code Status: DNR-CC    PT/OT Eval Status: not ordered    Dispo - unclear, pending  workup       Thomes Cake, MD    Thank you [name], APRN - CNP for the opportunity to be involved in this patient's care. If you have any questions or concerns please feel free to contact me at [phone].

## 2021-10-06 NOTE — Plan of Care (Signed)
Problem: Discharge Planning  Goal: Discharge to home or other facility with appropriate resources  Outcome: Progressing     Problem: Pain  Goal: Verbalizes/displays adequate comfort level or baseline comfort level  Outcome: Progressing     Problem: Skin/Tissue Integrity  Goal: Absence of new skin breakdown  Description: 1.  Monitor for areas of redness and/or skin breakdown  2.  Assess vascular access sites hourly  3.  Every 4-6 hours minimum:  Change oxygen saturation probe site  4.  Every 4-6 hours:  If on nasal continuous positive airway pressure, respiratory therapy assess nares and determine need for appliance change or resting period.  Outcome: Progressing     Problem: Safety - Adult  Goal: Free from fall injury  Outcome: Progressing     Problem: ABCDS Injury Assessment  Goal: Absence of physical injury  Outcome: Progressing

## 2021-10-06 NOTE — ED Provider Notes (Signed)
[facility]  ED      CHIEF COMPLAINT  Altered Mental Status (Pt from home, family could not wake her up, family told medics " she wasn't acting normal")     HISTORY OF PRESENT ILLNESS  Lauren Bryant is a 87 y.o. female  who presents to the ED complaining of altered mental status.  Patient was last seen by family member 2 days ago and was normal at that point in time.  She does have a caretaker who states that when she woke up this morning she was not acting like herself.  Was not talking, did eat a little bit of toast.  Her granddaughter is at bedside and states that she was complaining of constipation a few days ago.  She is unable to contribute further to the history.    No other complaints, modifying factors or associated symptoms.     I have reviewed the following from the nursing documentation.    Past Medical History:   Diagnosis Date    Compression fracture of body of thoracic vertebra (HCC)     Frequent UTI     Hyperlipidemia     TIA 2015     Past Surgical History:   Procedure Laterality Date    HYSTERECTOMY (CERVIX STATUS UNKNOWN)      TUBAL LIGATION       History reviewed. No pertinent family history.  Social History     Socioeconomic History    Marital status: Widowed     Spouse name: Not on file    Number of children: Not on file    Years of education: Not on file    Highest education level: Not on file   Occupational History    Not on file   Tobacco Use    Smoking status: Former     Packs/day: 0.50     Types: Cigarettes     Quit date: 03/01/2020     Years since quitting: 1.6    Smokeless tobacco: Never   Substance and Sexual Activity    Alcohol use: No    Drug use: Not on file    Sexual activity: Not on file   Other Topics Concern    Not on file   Social History Narrative    Not on file     Social Determinants of Health     Financial Resource Strain: Not on file   Food Insecurity: Not on file   Transportation Needs: Not on file   Physical Activity: Inactive    Days of Exercise per Week: 0 days     Minutes of Exercise per Session: 0 min   Stress: Not on file   Social Connections: Not on file   Intimate Partner Violence: Not on file   Housing Stability: Not on file     Current Facility-Administered Medications   Medication Dose Route Frequency Provider Last Rate Last Admin    0.9 % sodium chloride infusion   IntraVENous PRN Rudell Cobb, MD        pantoprazole (PROTONIX) 80 mg in sodium chloride 0.9 % 100 mL infusion  8 mg/hr IntraVENous Continuous Rudell Cobb, MD        [START ON 10/09/2021] pantoprazole (PROTONIX) 40 mg in sodium chloride (PF) 10 mL injection  40 mg IntraVENous Daily Rudell Cobb, MD         Current Outpatient Medications   Medication Sig Dispense Refill    cephALEXin (KEFLEX) 500 MG capsule Take 1 capsule  by mouth 2 times daily for 10 days 20 capsule 0    ALPRAZolam (XANAX) 0.25 MG tablet Take 0.25 mg by mouth 3 times daily as needed for Anxiety or Sleep.      trimethoprim (TRIMPEX) 100 MG tablet Take 1 tablet by mouth daily 90 tablet 1    simvastatin (ZOCOR) 20 MG tablet Take 1 tablet by mouth nightly 14 tablet 0    venlafaxine (EFFEXOR XR) 37.5 MG extended release capsule TAKE 1 CAPSULE TWICE A DAY 180 capsule 3    docusate sodium (COLACE) 100 MG capsule Take 100 mg by mouth 2 times daily      ferrous sulfate 325 (65 Fe) MG tablet Take 325 mg by mouth daily (with breakfast)      aspirin 81 MG tablet Take 81 mg by mouth daily       Allergies   Allergen Reactions    Macrobid [Nitrofurantoin] Rash       REVIEW OF SYSTEMS  10 systems reviewed, pertinent positives per HPI otherwise noted to be negative.    PHYSICAL EXAM  BP 111/61    Pulse 76    Temp 98.5 ??F (36.9 ??C) (Oral)    Resp 19    Wt 102 lb (46.3 kg)    LMP  (LMP Unknown)    SpO2 95%    BMI 16.97 kg/m??    GENERAL APPEARANCE: Awake and alert. Cooperative. No acute distress.  HENT: Normocephalic. Atraumatic. Mucous membranes are dry.  NECK: Supple.    EYES: PERRL. EOM's grossly intact.  HEART/CHEST: RRR. No  murmurs.  LUNGS: Respirations unlabored. CTAB.   ABDOMEN: No tenderness. Soft. Non-distended. No masses. No organomegaly. No guarding or rebound.   MUSCULOSKELETAL: No extremity edema. Compartments soft.  No deformity.  No tenderness in the extremities.  All extremities neurovascularly intact.  SKIN: Warm and dry. No acute rashes.   NEUROLOGICAL: Moving all extremities, no facial droop  RECTAL (performed with RN chaperone at bedside): Dark stool    LABS  I have reviewed all labs for this visit.   Results for orders placed or performed during the hospital encounter of 10/06/21   COVID-19 & Influenza Combo    Specimen: Nasopharyngeal Swab   Result Value Ref Range    SARS-CoV-2 RNA, RT PCR NOT DETECTED NOT DETECTED    INFLUENZA A NOT DETECTED NOT DETECTED    INFLUENZA B NOT DETECTED NOT DETECTED   CBC with Auto Differential   Result Value Ref Range    WBC 8.0 4.0 - 11.0 K/uL    RBC 2.14 (L) 4.00 - 5.20 M/uL    Hemoglobin 6.0 (LL) 12.0 - 16.0 g/dL    Hematocrit 09.9 (LL) 36.0 - 48.0 %    MCV 90.0 80.0 - 100.0 fL    MCH 27.8 26.0 - 34.0 pg    MCHC 30.9 (L) 31.0 - 36.0 g/dL    RDW 83.3 (H) 82.5 - 15.4 %    Platelets 240 135 - 450 K/uL    MPV 10.7 (H) 5.0 - 10.5 fL    Neutrophils % 80.9 %    Lymphocytes % 7.8 %    Monocytes % 8.7 %    Eosinophils % 1.6 %    Basophils % 1.0 %    Neutrophils Absolute 6.5 1.7 - 7.7 K/uL    Lymphocytes Absolute 0.6 (L) 1.0 - 5.1 K/uL    Monocytes Absolute 0.7 0.0 - 1.3 K/uL    Eosinophils Absolute 0.1 0.0 - 0.6 K/uL  Basophils Absolute 0.1 0.0 - 0.2 K/uL   Comprehensive Metabolic Panel w/ Reflex to MG   Result Value Ref Range    Sodium 137 136 - 145 mmol/L    Potassium reflex Magnesium 4.2 3.5 - 5.1 mmol/L    Chloride 104 99 - 110 mmol/L    CO2 26 21 - 32 mmol/L    Anion Gap 7 3 - 16    Glucose 126 (H) 70 - 99 mg/dL    BUN 27 (H) 7 - 20 mg/dL    Creatinine 0.7 0.6 - 1.2 mg/dL    GFR Non-African American >60 >60    GFR African American >60 >60    Calcium 8.9 8.3 - 10.6 mg/dL    Total Protein  5.7 (L) 6.4 - 8.2 g/dL    Albumin 3.6 3.4 - 5.0 g/dL    Albumin/Globulin Ratio 1.7 1.1 - 2.2    Total Bilirubin 0.3 0.0 - 1.0 mg/dL    Alkaline Phosphatase 55 40 - 129 U/L    ALT 10 10 - 40 U/L    AST 15 15 - 37 U/L   Troponin   Result Value Ref Range    Troponin 0.03 (H) <0.01 ng/mL   Lactate, Sepsis   Result Value Ref Range    Lactic Acid, Sepsis 1.0 0.4 - 1.9 mmol/L   Blood gas, venous   Result Value Ref Range    pH, Ven 7.407 7.350 - 7.450    pCO2, Ven 38.9 (L) 40.0 - 50.0 mmHg    pO2, Ven 34.6 25.0 - 40.0 mmHg    HCO3, Venous 23.9 23.0 - 29.0 mmol/L    Base Excess, Ven -0.7 -3.0 - 3.0 mmol/L    O2 Sat, Ven 62 Not Established %    Carboxyhemoglobin 2.8 (H) 0.0 - 1.5 %    MetHgb, Ven 1.0 <1.5 %    TC02 (Calc), Ven 25 Not Established mmol/L    O2 Therapy Unknown    Ammonia   Result Value Ref Range    Ammonia <10 (L) 11 - 51 umol/L   Sample possible blood bank testing   Result Value Ref Range    Specimen Status COLL    Blood Occult Stool Screen #1   Result Value Ref Range    Occult Blood Screening Result: POSITIVE  Normal range: Negative   (A)    EKG 12 Lead   Result Value Ref Range    Ventricular Rate 89 BPM    Atrial Rate 267 BPM    QRS Duration 106 ms    Q-T Interval 394 ms    QTc Calculation (Bazett) 479 ms    R Axis 87 degrees    T Axis 52 degrees    Diagnosis       Atrial flutter with variable A-V blockIncomplete right bundle branch blockNonspecific ST abnormalityAbnormal ECGWhen compared with ECG of 23-Sep-2021 00:06,Atrial flutter has replaced Sinus rhythm   TYPE AND SCREEN   Result Value Ref Range    ABO/Rh A POS     Antibody Screen NEG        ECG  The Ekg interpreted by me shows  atrial flutter with variable block with a rate of 89  Axis is   Normal  QTc is  within an acceptable range  Intervals and Durations are unremarkable.      ST Segments: nonspecific changes  Atrial flutter has replaced sinus rhythm with PACs compared to previous performed 09/24/2019 detailed    RADIOLOGY  CT HEAD  WO CONTRAST   Final  Result   No acute intracranial abnormality.  There is no change from prior examination.         XR CHEST PORTABLE   Final Result   1.  No acute abnormality.           ED COURSE/MDM  Patient seen and evaluated. Old records reviewed. Labs and imaging reviewed and results discussed with patient.      Patient is a 87 year old female, presenting with concerns for altered mental status.  Full HPI as detailed above.  Upon arrival in the ED, vitals reassuring.  Patient is resting comfortably is in no acute distress, unable to contribute to the history.  This is a reported decline in her mental status over the past few days.  EKG shows likely new atrial flutter.  Troponin slightly elevated at 0.03.  Hemoglobin did return at 6 and 1 unit was ordered for transfusion.  Her Hemoccult was positive with melanotic stool and she was started on Protonix bolus and drip.  Remained normotensive throughout course of stay in the ED.  CT of the head negative for acute concerning findings, chest x-ray negative as well.  Patient will be hospitalized for further work-up and treatment of her GI bleed, altered mental status, and elevated troponin.  Findings are discussed with the granddaughter at bedside and she is comfortable in agreement with plan of care.    The total Critical Care time is 45 minutes which excludes separately billable procedures.    I, Dr. Rudell Cobb, MD, am the primary clinician of record.    Is this patient to be included in the SEP-1 Core Measure?  No   Exclusion criteria - the patient is NOT to be included for SEP-1 Core Measure due to:  Infection is not suspected     During the patient's ED course, the patient was given:  Medications   0.9 % sodium chloride infusion (has no administration in time range)   pantoprazole (PROTONIX) 80 mg in sodium chloride 0.9 % 100 mL infusion (has no administration in time range)   pantoprazole (PROTONIX) 40 mg in sodium chloride (PF) 10 mL injection (has no administration in time  range)   0.9 % sodium chloride bolus (0 mLs IntraVENous Stopped 10/06/21 1253)   pantoprazole (PROTONIX) 80 mg in sodium chloride 0.9 % 50 mL bolus (80 mg IntraVENous New Bag 10/06/21 1249)        CLINICAL IMPRESSION  1. Acute blood loss anemia    2. Gastrointestinal hemorrhage, unspecified gastrointestinal hemorrhage type    3. Altered mental status, unspecified altered mental status type    4. Atrial flutter, unspecified type (HCC)    5. Elevated troponin        Blood pressure 111/61, pulse 76, temperature 98.5 ??F (36.9 ??C), temperature source Oral, resp. rate 19, weight 102 lb (46.3 kg), SpO2 95 %.    DISPOSITION  Lauren Bryant was admitted in fair condition.      Patient was given scripts for the following medications. I counseled patient how to take these medications.   New Prescriptions    No medications on file       Follow-up with:  No follow-up provider specified.    DISCLAIMER: This chart was created using Scientist, clinical (histocompatibility and immunogenetics).  Efforts were made by me to ensure accuracy, however some errors may be present due to limitations of this technology and occasionally words are not transcribed correctly.  Rudell Cobb, MD  10/06/21 1321

## 2021-10-06 NOTE — ED Notes (Signed)
Patient's granddaughter is at the bedside.  She reports that the patient is "acting more like herself, except the jiberish talk" will continue to monitor,.      Alvino Chapel, RN  10/06/21 [phone]

## 2021-10-06 NOTE — Progress Notes (Signed)
Pt admitted to room 204 from ER. Pt and family oriented to room and call light. Bed low and locked with bed alarm on. Call light in reach, gripper socks on. CMU aware. Will continue to monitor.

## 2021-10-06 NOTE — ACP (Advance Care Planning) (Signed)
Advance Care Planning     General Advance Care Planning (ACP) Conversation    Date of Conversation: 10/06/2021  Conducted with: Patient with Decision Making Capacity    Healthcare Decision Maker:  No healthcare decision makers have been documented.  Click here to complete Clinical research associate of the Environmental health practitioner Relationship (ie "Primary")   Today we documented Decision Maker(s) consistent with Legal Next of Kin hierarchy.    Content/Action Overview:  Has NO ACP documents/care preferences - information provided, considering goals and options          Length of Voluntary ACP Conversation in minutes:  <16 minutes (Non-Billable)    Evert Kohl, MSW

## 2021-10-06 NOTE — ED Notes (Signed)
Blood consent obtained.      Dolores Frame, RN  10/06/21 [phone]

## 2021-10-06 NOTE — ED Notes (Signed)
Patients granddaughter Kristina's phone number verified prior to her leaving the ED. Family given update at this time. Family states she will be back to the ED later this afternoon.     Dolores Frame, RN  10/06/21 1425

## 2021-10-06 NOTE — ED Notes (Signed)
Lab called regarding blood for transfusion, spoke to [place] who states "the blood is now ready".     Dolores Frame, RN  10/06/21 [phone]

## 2021-10-07 LAB — BASIC METABOLIC PANEL W/ REFLEX TO MG FOR LOW K
Anion Gap: 13 (ref 3–16)
BUN: 18 mg/dL (ref 7–20)
CO2: 24 mmol/L (ref 21–32)
Calcium: 8.7 mg/dL (ref 8.3–10.6)
Chloride: 103 mmol/L (ref 99–110)
Creatinine: 0.6 mg/dL (ref 0.6–1.2)
GFR African American: >60 (ref >60)
GFR Non-African American: >60 (ref >60)
Glucose: 103 mg/dL — ABNORMAL HIGH (ref 70–99)
Potassium reflex Magnesium: 4.2 mmol/L (ref 3.5–5.1)
Sodium: 140 mmol/L (ref 136–145)

## 2021-10-07 LAB — CBC WITH AUTO DIFFERENTIAL
Basophils %: 0.6 %
Basophils Absolute: 0.1 10*3/uL (ref 0.0–0.2)
Eosinophils %: 0.1 %
Eosinophils Absolute: 0 10*3/uL (ref 0.0–0.6)
Hematocrit: 24.4 % — ABNORMAL LOW (ref 36.0–48.0)
Hemoglobin: 7.3 g/dL — ABNORMAL LOW (ref 12.0–16.0)
Lymphocytes %: 4.9 %
Lymphocytes Absolute: 0.5 10*3/uL — ABNORMAL LOW (ref 1.0–5.1)
MCH: 27.8 pg (ref 26.0–34.0)
MCHC: 30 g/dL — ABNORMAL LOW (ref 31.0–36.0)
MCV: 92.4 fL (ref 80.0–100.0)
MPV: 11.6 fL — ABNORMAL HIGH (ref 5.0–10.5)
Monocytes %: 8.1 %
Monocytes Absolute: 0.9 10*3/uL (ref 0.0–1.3)
Neutrophils %: 86.3 %
Neutrophils Absolute: 9.6 10*3/uL — ABNORMAL HIGH (ref 1.7–7.7)
Platelets: 223 10*3/uL (ref 135–450)
RBC: 2.64 M/uL — ABNORMAL LOW (ref 4.00–5.20)
RDW: 16 % — ABNORMAL HIGH (ref 12.4–15.4)
WBC: 11.1 10*3/uL — ABNORMAL HIGH (ref 4.0–11.0)

## 2021-10-07 LAB — HEMOGLOBIN AND HEMATOCRIT
Hematocrit: 25 % — ABNORMAL LOW (ref 36.0–48.0)
Hematocrit: 30.3 % — ABNORMAL LOW (ref 36.0–48.0)
Hemoglobin: 7.7 g/dL — ABNORMAL LOW (ref 12.0–16.0)
Hemoglobin: 9.4 g/dL — ABNORMAL LOW (ref 12.0–16.0)

## 2021-10-07 LAB — URINALYSIS
Bilirubin Urine: NEGATIVE
Blood, Urine: NEGATIVE
Glucose, Ur: NEGATIVE mg/dL
Ketones, Urine: 15 mg/dL — AB
Leukocyte Esterase, Urine: NEGATIVE
Nitrite, Urine: NEGATIVE
Protein, UA: NEGATIVE mg/dL
Specific Gravity, UA: 1.02 (ref 1.005–1.030)
Urobilinogen, Urine: 0.2 E.U./dL (ref <2.0)
pH, UA: 6 (ref 5.0–8.0)

## 2021-10-07 LAB — PROCALCITONIN: Procalcitonin: 0.03 ng/mL (ref 0.00–0.15)

## 2021-10-07 LAB — PREPARE RBC (CROSSMATCH): Dispense Status Blood Bank: TRANSFUSED

## 2021-10-07 MED ORDER — LIP BALM EX OINT
CUTANEOUS | Status: AC
Start: 2021-10-07 — End: 2021-10-07
  Administered 2021-10-07: 17:00:00

## 2021-10-07 MED ORDER — HALOPERIDOL LACTATE 5 MG/ML IJ SOLN
5 MG/ML | Freq: Once | INTRAMUSCULAR | Status: AC
Start: 2021-10-07 — End: 2021-10-06
  Administered 2021-10-07: 03:00:00 2 mg via INTRAMUSCULAR

## 2021-10-07 MED ORDER — QUETIAPINE FUMARATE 25 MG PO TABS
25 MG | Freq: Every day | ORAL | Status: DC
Start: 2021-10-07 — End: 2021-10-10

## 2021-10-07 MED ORDER — SODIUM CHLORIDE 0.9 % IV SOLN
0.9 % | INTRAVENOUS | Status: AC
Start: 2021-10-07 — End: 2021-10-08
  Administered 2021-10-07: 23:00:00 via INTRAVENOUS

## 2021-10-07 MED ORDER — SODIUM CHLORIDE 0.9 % IV SOLN
0.9 | INTRAVENOUS | Status: DC | PRN
Start: 2021-10-07 — End: 2021-10-14

## 2021-10-07 MED ORDER — OLANZAPINE 10 MG IM SOLR
10 MG | Freq: Once | INTRAMUSCULAR | Status: AC
Start: 2021-10-07 — End: 2021-10-07
  Administered 2021-10-07: 06:00:00 2.5 mg via INTRAMUSCULAR

## 2021-10-07 MED ORDER — HALOPERIDOL LACTATE 5 MG/ML IJ SOLN
5 MG/ML | Freq: Four times a day (QID) | INTRAMUSCULAR | Status: DC | PRN
Start: 2021-10-07 — End: 2021-10-14
  Administered 2021-10-07 – 2021-10-12 (×5): 1 mg via INTRAMUSCULAR

## 2021-10-07 MED FILL — HALOPERIDOL LACTATE 5 MG/ML IJ SOLN: 5 MG/ML | INTRAMUSCULAR | Qty: 1

## 2021-10-07 MED FILL — VASELINE LIP THERAPY EX OINT: CUTANEOUS | Qty: 10

## 2021-10-07 MED FILL — PROTONIX 40 MG IV SOLR: 40 MG | INTRAVENOUS | Qty: 80

## 2021-10-07 MED FILL — OLANZAPINE 10 MG IM SOLR: 10 MG | INTRAMUSCULAR | Qty: 10

## 2021-10-07 NOTE — Progress Notes (Signed)
Blood transfusion began at 1026. Patient tolerating well. No current s/s of reaction. Vital signs WNL. Patient resting comfortably in bed asleep. Granddaughter at bedside.    Penne Lash, RN

## 2021-10-07 NOTE — Plan of Care (Signed)
Problem: Skin/Tissue Integrity  Goal: Absence of new skin breakdown  Description: 1.  Monitor for areas of redness and/or skin breakdown  2.  Assess vascular access sites hourly  3.  Every 4-6 hours minimum:  Change oxygen saturation probe site  4.  Every 4-6 hours:  If on nasal continuous positive airway pressure, respiratory therapy assess nares and determine need for appliance change or resting period.  Outcome: Progressing     Problem: Safety - Adult  Goal: Free from fall injury  Outcome: Progressing

## 2021-10-07 NOTE — Progress Notes (Signed)
Attempted to call speech twice to see if they were able to evaluate patient at this time. No answer.

## 2021-10-07 NOTE — Progress Notes (Signed)
Hospitalist Progress Note      PCP: [name], APRN - CNP    Date of Admission: 10/06/2021    Chief Complaint:  unresponsive    Hospital Course: reviewed     Subjective: resting in bed, has mumbled speech(?lack of dentures)       Medications:  Reviewed    Infusion Medications    sodium chloride      sodium chloride      pantoprazole 8 mg/hr (10/07/21 0858)    sodium chloride      sodium chloride       Scheduled Medications    [START ON 10/09/2021] pantoprazole (PROTONIX) 40 mg injection  40 mg IntraVENous Daily    venlafaxine  37.5 mg Oral BID    sodium chloride flush  5-40 mL IntraVENous 2 times per day    enoxaparin  30 mg SubCUTAneous Daily    sodium chloride flush  5-40 mL IntraVENous 2 times per day     PRN Meds: sodium chloride, sodium chloride, sodium chloride flush, sodium chloride, ondansetron **OR** ondansetron, sodium chloride flush, sodium chloride, polyethylene glycol, acetaminophen **OR** acetaminophen      Intake/Output Summary (Last 24 hours) at 10/07/2021 1205  Last data filed at 10/07/2021 1024  Gross per 24 hour   Intake 633.75 ml   Output 1100 ml   Net -466.25 ml       Physical Exam Performed:    BP 105/78    Pulse 95    Temp 98.4 ??F (36.9 ??C) (Axillary)    Resp 18    Wt 102 lb (46.3 kg)    LMP  (LMP Unknown)    SpO2 98%    BMI 16.97 kg/m??       General appearance:  No apparent distress, appears stated age and cooperative.  HEENT:  Normal cephalic, atraumatic without obvious deformity. Pupils equal, round, and reactive to light.  Extra ocular muscles intact. Conjunctivae/corneas clear.  Neck: Supple, with full range of motion. No jugular venous distention. Trachea midline.  Respiratory:  Normal respiratory effort. Clear to auscultation, bilaterally without Rales/Wheezes/Rhonchi.  Cardiovascular:  Regular rate and rhythm with normal S1/S2 without murmurs, rubs or gallops.  Abdomen: Soft, non-tender, non-distended with normal bowel sounds.  Musculoskeletal:  No clubbing, cyanosis or  edema bilaterally.  Full range of motion without deformity.  Skin: Skin color, texture, turgor normal.  No rashes or lesions.  Neurologic:  Neurovascularly intact without any focal sensory/motor deficits. Cranial nerves: II-XII intact, grossly non-focal.  Psychiatric:  Alert and oriented x1, thought content not appropriate, not normal insight  Capillary Refill: Brisk,3 seconds, normal  Peripheral Pulses: +2 palpable, equal bilaterally       Labs:   Recent Labs     10/06/21  1059 10/06/21  2241 10/07/21  0618   WBC 8.0  --  11.1*   HGB 6.0* 7.7* 7.3*   HCT 19.3* 25.0* 24.4*   PLT 240  --  223     Recent Labs     10/06/21  1059 10/07/21  0618   NA 137 140   K 4.2 4.2   CL 104 103   CO2 26 24   BUN 27* 18   CREATININE 0.7 0.6   CALCIUM 8.9 8.7     Recent Labs     10/06/21  1059   AST 15   ALT 10   BILITOT 0.3   ALKPHOS 55     No results for input(s): INR in the last 72 hours.  Recent Labs     10/06/21  1059   TROPONINI 0.03*       Urinalysis:      Lab Results   Component Value Date/Time    NITRU Negative 10/07/2021 09:50 AM    WBCUA 87 09/24/2021 03:21 AM    BACTERIA 4+ 09/24/2021 03:21 AM    RBCUA 1 09/24/2021 03:21 AM    BLOODU Negative 10/07/2021 09:50 AM    SPECGRAV 1.020 10/07/2021 09:50 AM    GLUCOSEU Negative 10/07/2021 09:50 AM       Radiology:  CT HEAD WO CONTRAST   Final Result   No acute intracranial abnormality.  There is no change from prior examination.         XR CHEST PORTABLE   Final Result   1.  No acute abnormality.                 Assessment/Plan:    Active Hospital Problems    Diagnosis     GIB (gastrointestinal bleeding) [K92.2]      Priority: Medium       Possible GIB - POA, +FOBT  -GI consulted  -PPI ggt  -Trended h/h  -1u prbc in ER   -1u 10/14    Abn EKG- concerns for atrial flutter  -on tele     Acute encephalopathy- with baseline dementia  -checked UA and neg   -procal was neg    Suspected Acute blood loss anemia- due to possible GIB  -1u prbc given 10/14  -monitored h/h     HLD-held  statin  Chronic uti- held trimpex  MISC- Held asa     DVT Prophylaxis: scd  Diet: Diet NPO  Code Status: DNR-CC  PT/OT Eval Status: not ordered    Dispo - pending ucs, 1u prbc today, ivfs given due to lack of po intake    Appropriate for A1 Discharge Unit: No      Thomes Cake, MD

## 2021-10-07 NOTE — Progress Notes (Signed)
Spoke to Dr. Sterling Big this am. Would like urinalysis as well as one more unit RBC. RN called daughter and POA Kendal Hymen with this information. Verbalized understanding and agreed. Blood consent previously received and valid.    Penne Lash, RN

## 2021-10-07 NOTE — Care Coordination-Inpatient (Signed)
Spoke with RN and MD both who state a palliative consult would be a good idea to offer support to family.  Patient's code status is already a DNR-CC so just the extra support would be helpful.  Discussed with patient's son and he is appreciative of this idea.  Consult placed.  Left message for [facility], Palliative RN.

## 2021-10-08 LAB — CBC WITH AUTO DIFFERENTIAL
Basophils %: 0.5 %
Basophils Absolute: 0.1 10*3/uL (ref 0.0–0.2)
Eosinophils %: 0.1 %
Eosinophils Absolute: 0 10*3/uL (ref 0.0–0.6)
Hematocrit: 30.9 % — ABNORMAL LOW (ref 36.0–48.0)
Hemoglobin: 9.4 g/dL — ABNORMAL LOW (ref 12.0–16.0)
Lymphocytes %: 3.4 %
Lymphocytes Absolute: 0.5 10*3/uL — ABNORMAL LOW (ref 1.0–5.1)
MCH: 28.2 pg (ref 26.0–34.0)
MCHC: 30.4 g/dL — ABNORMAL LOW (ref 31.0–36.0)
MCV: 92.9 fL (ref 80.0–100.0)
MPV: 11.3 fL — ABNORMAL HIGH (ref 5.0–10.5)
Monocytes %: 7.5 %
Monocytes Absolute: 1.1 10*3/uL (ref 0.0–1.3)
Neutrophils %: 88.5 %
Neutrophils Absolute: 12.7 10*3/uL — ABNORMAL HIGH (ref 1.7–7.7)
Platelets: 248 10*3/uL (ref 135–450)
RBC: 3.32 M/uL — ABNORMAL LOW (ref 4.00–5.20)
RDW: 14.9 % (ref 12.4–15.4)
WBC: 14.4 10*3/uL — ABNORMAL HIGH (ref 4.0–11.0)

## 2021-10-08 LAB — BASIC METABOLIC PANEL W/ REFLEX TO MG FOR LOW K
Anion Gap: 21 — ABNORMAL HIGH (ref 3–16)
BUN: 15 mg/dL (ref 7–20)
CO2: 19 mmol/L — ABNORMAL LOW (ref 21–32)
Calcium: 8.8 mg/dL (ref 8.3–10.6)
Chloride: 98 mmol/L — ABNORMAL LOW (ref 99–110)
Creatinine: 0.6 mg/dL (ref 0.6–1.2)
GFR African American: >60 (ref >60)
GFR Non-African American: >60 (ref >60)
Glucose: 85 mg/dL (ref 70–99)
Potassium reflex Magnesium: 3.7 mmol/L (ref 3.5–5.1)
Sodium: 138 mmol/L (ref 136–145)

## 2021-10-08 MED ORDER — SODIUM CHLORIDE 0.9 % IV SOLN
0.9 % | INTRAVENOUS | Status: AC
Start: 2021-10-08 — End: 2021-10-09
  Administered 2021-10-08: 22:00:00 via INTRAVENOUS

## 2021-10-08 MED FILL — HALOPERIDOL LACTATE 5 MG/ML IJ SOLN: 5 MG/ML | INTRAMUSCULAR | Qty: 1

## 2021-10-08 MED FILL — VENLAFAXINE HCL ER 37.5 MG PO CP24: 37.5 MG | ORAL | Qty: 1

## 2021-10-08 MED FILL — PROTONIX 40 MG IV SOLR: 40 MG | INTRAVENOUS | Qty: 80

## 2021-10-08 MED FILL — LOVENOX 30 MG/0.3ML IJ SOSY: 30 MG/0.3ML | INTRAMUSCULAR | Qty: 0.3

## 2021-10-08 NOTE — Plan of Care (Signed)
Problem: Skin/Tissue Integrity  Goal: Absence of new skin breakdown  Description: 1.  Monitor for areas of redness and/or skin breakdown  2.  Assess vascular access sites hourly  3.  Every 4-6 hours minimum:  Change oxygen saturation probe site  4.  Every 4-6 hours:  If on nasal continuous positive airway pressure, respiratory therapy assess nares and determine need for appliance change or resting period.  10/08/2021 1243 by Marlowe Sax, RN  Outcome: Progressing   Encouraged patient to turn every 2 hours and prn. Mepilex on coccyx for preventative. Has foley catheter.  Problem: Safety - Adult  Goal: Free from fall injury  10/08/2021 1243 by Marlowe Sax, RN  Outcome: Progressing   Bed alarm, non-skid slipper socks on, call light within reach. Family at bedside.

## 2021-10-08 NOTE — Progress Notes (Signed)
Suctioned patient's mouth multiple times with yanker suction after weak coughs. Mouth care given also. Will continue to monitor.

## 2021-10-08 NOTE — Plan of Care (Signed)
Problem: Discharge Planning  Goal: Discharge to home or other facility with appropriate resources  Outcome: Progressing     Problem: Pain  Goal: Verbalizes/displays adequate comfort level or baseline comfort level  Outcome: Progressing     Problem: Skin/Tissue Integrity  Goal: Absence of new skin breakdown  Description: 1.  Monitor for areas of redness and/or skin breakdown  2.  Assess vascular access sites hourly  3.  Every 4-6 hours minimum:  Change oxygen saturation probe site  4.  Every 4-6 hours:  If on nasal continuous positive airway pressure, respiratory therapy assess nares and determine need for appliance change or resting period.  Outcome: Progressing     Problem: Safety - Adult  Goal: Free from fall injury  Outcome: Progressing     Problem: ABCDS Injury Assessment  Goal: Absence of physical injury  Outcome: Progressing     Problem: Chronic Conditions and Co-morbidities  Goal: Patient's chronic conditions and co-morbidity symptoms are monitored and maintained or improved  Outcome: Progressing

## 2021-10-08 NOTE — Progress Notes (Addendum)
Speech Language Pathology    Speech Language Pathology  Clinical Bedside Swallow Assessment  Facility/Department: [facility] CARD TELEMETRY        Recommendations:  Diet recommendation: NPO; Ice chips with SLP/RN only following stringent oral care, pasted toothbrushing with suction; Meds via alt means of nutrition    Consider alternative means of nutrition versus more comfort-oriented level of care  Instrumentation: not indicated at this time  Risk management: oral care 2-3x/day to reduce adverse affects in the event of aspiration      NAME:Lauren Bryant  DOB: 1924-03-19 (87 y.o.)   MRN: [id number]  ROOM: 0204/0204-01  ADMISSION DATE: 10/06/2021  PATIENT DIAGNOSIS(ES): Acute blood loss anemia [D62]  GIB (gastrointestinal bleeding) [K92.2]  Elevated troponin [R77.8]  Altered mental status, unspecified altered mental status type [R41.82]  Gastrointestinal hemorrhage, unspecified gastrointestinal hemorrhage type [K92.2]  Atrial flutter, unspecified type [facility]) [I48.92]  Chief Complaint   Patient presents with    Altered Mental Status     Pt from home, family could not wake her up, family told medics " she wasn't acting normal"     Patient Active Problem List    Diagnosis Date Noted    GIB (gastrointestinal bleeding) 10/06/2021    Hyperlipidemia     Compression fracture of body of thoracic vertebra (HCC)     Frequent UTI     Pneumonia 03/20/2017     Past Medical History:   Diagnosis Date    Compression fracture of body of thoracic vertebra (HCC)     Frequent UTI     Hyperlipidemia     TIA 2015     Past Surgical History:   Procedure Laterality Date    HYSTERECTOMY (CERVIX STATUS UNKNOWN)      TUBAL LIGATION       Allergies   Allergen Reactions    Macrobid [Nitrofurantoin] Rash       DATE ONSET:  admit date 10/06/21    Date of Evaluation: 10/08/2021   Evaluating Therapist: Leretha Dykes, SLP    Chart Reviewed: : [x]  Yes []  No    Current Diet: Diet NPO Exceptions are: Ice Chips, Sips of Water with Meds    Recent Chest  Radiography: [x]  Chest XR   []  CT of Chest  Date: 10/06/21  Impressions : .  "No acute abnormality."    Pain: UTA, pt did not answer questions regarding pain    Reason for Referral  Lauren Bryant was referred for a bedside swallow evaluation to assess the efficiency of their swallow function, identify signs and symptoms of aspiration and make recommendations regarding safe dietary consistencies, effective compensatory strategies, and safe eating environment.    Assessment    Medical record review/interview: Per MD H&P: "87 y.o. female dementia who presented to [facility] with unresponsiveness. Pt was last normal on Monday and in the past 2 days, was noted to have some inc'd irritability and dec'd po intake. Pt was at the breakfast table and had period of unresponsivenss and caregiver called EMS. Pt has been having chronic dark stools of late but no nsaid use or hematemesis. -of note, pt had fall during summer time and diagnosed with compression fx. ER course: noted anemia, +FOBT, 1u prbc ordered."       Predisposing dysphagia risk factors: Hx of CVA and Age  Clinical signs of possible chronic dysphagia: reduced PO intake  Precipitating dysphagia risk factors: AMS    Patient Complaints:  Odynophagia: []  Yes [x]  No  Globus Sensation: []  Yes [  x] No  SOB with PO intake: []  Yes [x]  No  Increased WOB with PO intake: []  Yes [x]  No  Reflux Sx's: []  Yes [x]  No  Weight loss: []  Yes [x]  No  Coughing/Choking with PO intake: []  Yes [x]  No  Reduced Appetite: [x]  Yes []  No    Additional Reported Symptoms/Complaints/Hospital Course: Daughter at bedside. Daughter reports that pt has not eaten since Wednesday.    Pt's goals: n/a; pt minimally verbalizes but does make eye contact during eval    Vitals/labs:   Temp: n/a  SpO2: 99%  RR: 20/min  BP: 103/52  HR: 82  O2 device: RA    CBC:   Recent Labs     10/08/21  0623   WBC 14.4*   HGB 9.4*   PLT 248      BMP:  Recent Labs     10/08/21  0623   NA 138   K 3.7   CL 98*   CO2 19*   BUN  15   CREATININE 0.6   GLUCOSE 85          Cranial nerve exam:   CN V (trigeminal): ophthalmic, maxillary, and mandibular facial sensation- WFL  CN VII (facial): Reduced  CN IX/X (glossopharyngeal/vagus): MPT: Reduced; pitch range: Reduced; vocal quality: weak; cough: Weak- perceptually and Congested  CN XII (hypoglossal): Reduced    Laryngeal function exam:   Secretions: Severe xerostomia  Vocal quality: See CN exam above  MPT: See CN exam above  S/Z ratio: DNT  Pitch range: See CN exam above  Cough: See CN exam above    Oral Care Status:    []  Oral Care [facility]  []  Poor oral care status  []  Edentulous  []  Upper Dentures  []  Lower Dentures  [x]  Missing/Broken Teeth  []  Evidence of dental cavities/carries    PO trials:   IDDSI 0 (thin): RR 20/min prior to po trials with O2 sat of 99% on RA. Pt did not round lips to spoon when ice chip presented. Pt allowed ice chip to come to rest in left lateral sulcus, and exhibited minimal oral manipulation of ice chip trial. Pt's respirations at level of laryngeal vestibule were increasingly wet (not during digital palpation) during oral manipulation of ice chip trials, and wetness increased with swallow. Suspected premature loss if ice chip trials to pharynx during oral manipulation and continued aspiration with swallow onset. Cough is spontaneous but extremely weak and does not clear wet respiratory/vocal quality. RR increased to 24/min following ice chip trials with O2 sat of 97%.    IDDSI 2 (mildly thick): Attempted, but pt did not round lips to cup; SLP did not administer as pt did not seem to be aware of attempted cup sip trials. Daughter requested that SLP attempt straw sip trial, and this was attempted, but pt was too weak to draw liquid through straw    IDDSI 4 (puree): Pt accepted pureed food trials but exhibited minimal oral manipulation; pt held po trials in oral cavity for each trial, inconsistently swallowed with cues by SLP. Pt again developed wet respiratory quality  following swallow of puree due to suspected aspiration. Pt unable to cough forcefully enough to clear suspected aspirated material.    3 oz water: DNT    Impressions:  Suspected severe oral phase dysphagia characterized by limited po acceptance and suspected premature loss of po trials to pharynx prior to A-P transmission of bolus.  Suspect severe pharyngeal phase dysphagia with frank s/s of aspiration  with all po trials this date.     Recommendations:  Diet recommendation: NPO; Ice chips with SLP/RN only following stringent oral care, pasted toothbrushing with suction; Meds via alt means of nutrition    Consider alternative means of nutrition versus more comfort-oriented level of care  Instrumentation: not indicated at this time  Risk management: oral care 2-3x/day to reduce adverse affects in the event of aspiration    Prognosis: Guarded    Recommended Intervention:   []  Dysphagia tx  []  Videostroboscopy                      []  NPO   []  MBS       []  Speech/Cog Eval    []  Therapeutic PO Trials     []  Ice Chips   []  Other:  []  FEES                                                 Dysphagia Therapeutic Intervention:   []   Bolus control Exercises  []   Oral Motor Exercises  []   Protocol  []   Thermal Stimulation  []   Oral Care    []   Vital Stim/NMES  []   Laryngeal Exercises  []   Patient/Family Education  []   Pharyngeal Exercises  [x]   Therapeutic PO trials with SLP  []   Diet tolerance monitoring  []   Other:     Referrals:  []  ENT    []  PT  []  Pulmonology []  GI  []  Neurology  []  RD  []  OT   []  Social Worker    Goals:  Short Term Goals:  Timeframe for Short Term Goals: (3x/ wk for 1 week until 10/15/21  Goal 1: The patient will tolerate repeat BSE when able for ongoing assessment      Long Term Goals:   Timeframe for Long Term Goals: (1-2 weeks)  Goal 1: The patient will tolerate least restrictive diet with no clinical s/s of aspiration or worsening respiratory/pulmonary status    Treatment:  Skilled  instruction completed with patient re: evidenced based practice regarding recommendations and POC, importance of oral care to reduce adverse affects in the event of aspiration, and instruction of recommended compensatory strategies developed based upon clinical exam. Pt able to recall/demonstrate compensatory strategies with max cues.      Pt Education: SLP educated the patient re: Role of SLP, rationale for completion of assessment, anatomical components of swallow structures as they pertain to airway protection, results of assessment, and recommendations  Pt Education Response: no evidence of learning and would benefit from ongoing education    Duration/Frequency of Tx: 3x/wk for 1 week until 10/15/21    Individuals Consulted:   [x]   Patient     []   NP         [x]   RN   []   RD                   []   MD      [x]   Family Member - daughter                       []   PA    []   Other:      Safety Devices / Report:  [x]   All fall risk precautions in place []   Safety handoff completed with RN  [x]   Bed alarm in place  []   Left in bed     []   Chair alarm in place  []   Left in chair   [x]   Call light in reach   []   Other:                             Total Treatment Time / Charges       Time in Time out Total Time / units   Swallow Eval/Tx Time  0845 0910  25 min / 2 units     Signature:      Pattye Meda L. Sawyer, CCC-SLP  Speech-Language Pathologist

## 2021-10-08 NOTE — Progress Notes (Signed)
Hospitalist Progress Note      PCP: [name], APRN - CNP    Date of Admission: 10/06/2021    Chief Complaint:  unresponsive     Hospital Course: reviewed      Subjective: resting in bed but working with nursing staff to steady to sit up in chair, family at bedside, made NPO by SLP today    Medications:  Reviewed    Infusion Medications    sodium chloride      sodium chloride      pantoprazole 8 mg/hr (10/08/21 1016)    sodium chloride      sodium chloride       Scheduled Medications    QUEtiapine  12.5 mg Oral Daily    [START ON 10/09/2021] pantoprazole (PROTONIX) 40 mg injection  40 mg IntraVENous Daily    venlafaxine  37.5 mg Oral BID    sodium chloride flush  5-40 mL IntraVENous 2 times per day    enoxaparin  30 mg SubCUTAneous Daily    sodium chloride flush  5-40 mL IntraVENous 2 times per day     PRN Meds: sodium chloride, haloperidol lactate, sodium chloride, sodium chloride flush, sodium chloride, ondansetron **OR** ondansetron, sodium chloride flush, sodium chloride, polyethylene glycol, acetaminophen **OR** acetaminophen      Intake/Output Summary (Last 24 hours) at 10/08/2021 1100  Last data filed at 10/08/2021 0000  Gross per 24 hour   Intake 273.08 ml   Output 1600 ml   Net -1326.92 ml       Physical Exam Performed:    BP 127/87    Pulse (!) 108    Temp 98.8 ??F (37.1 ??C) (Axillary)    Resp 24    Wt 100 lb 15.5 oz (45.8 kg)    LMP  (LMP Unknown)    SpO2 98%    BMI 16.80 kg/m??      General appearance:  No apparent distress, appears stated age and cooperative. Frail appearing  HEENT:  Normal cephalic, atraumatic without obvious deformity. Pupils equal, round, and reactive to light.  Extra ocular muscles intact. Conjunctivae/corneas clear.  Neck: Supple, with full range of motion. No jugular venous distention. Trachea midline.  Respiratory:  Normal respiratory effort. Clear to auscultation, bilaterally without Rales/Wheezes/Rhonchi.  Cardiovascular:  Regular rate and rhythm with normal S1/S2  without murmurs, rubs or gallops.  Abdomen: Soft, non-tender, non-distended with normal bowel sounds.  Musculoskeletal:  No clubbing, cyanosis or edema bilaterally.  Full range of motion without deformity.  Skin: Skin color, texture, turgor normal.  No rashes or lesions.  Neurologic:  Neurovascularly intact without any focal sensory/motor deficits. Cranial nerves: II-XII intact, grossly non-focal.  Psychiatric:  Alert and oriented x1, thought content not appropriate, not normal insight  Capillary Refill: Brisk,3 seconds, normal  Peripheral Pulses: +2 palpable, equal bilaterally       Labs:   Recent Labs     10/06/21  1059 10/06/21  2241 10/07/21  0618 10/07/21  1745 10/08/21  0623   WBC 8.0  --  11.1*  --  14.4*   HGB 6.0*   < > 7.3* 9.4* 9.4*   HCT 19.3*   < > 24.4* 30.3* 30.9*   PLT 240  --  223  --  248    < > = values in this interval not displayed.     Recent Labs     10/06/21  1059 10/07/21  0618 10/08/21  0623   NA 137 140 138   K 4.2  4.2 3.7   CL 104 103 98*   CO2 26 24 19*   BUN 27* 18 15   CREATININE 0.7 0.6 0.6   CALCIUM 8.9 8.7 8.8     Recent Labs     10/06/21  1059   AST 15   ALT 10   BILITOT 0.3   ALKPHOS 55     No results for input(s): INR in the last 72 hours.  Recent Labs     10/06/21  1059   TROPONINI 0.03*       Urinalysis:      Lab Results   Component Value Date/Time    NITRU Negative 10/07/2021 09:50 AM    WBCUA 87 09/24/2021 03:21 AM    BACTERIA 4+ 09/24/2021 03:21 AM    RBCUA 1 09/24/2021 03:21 AM    BLOODU Negative 10/07/2021 09:50 AM    SPECGRAV 1.020 10/07/2021 09:50 AM    GLUCOSEU Negative 10/07/2021 09:50 AM       Radiology:  CT HEAD WO CONTRAST   Final Result   No acute intracranial abnormality.  There is no change from prior examination.         XR CHEST PORTABLE   Final Result   1.  No acute abnormality.                 Assessment/Plan:    Active Hospital Problems    Diagnosis     GIB (gastrointestinal bleeding) [K92.2]      Priority: Medium       Possible GIB - POA, +FOBT  -GI  consulted  -PPI ggt  -Trended h/h  -1u prbc in ER   -1u 10/14     Abn EKG- concerns for atrial flutter  -on tele     Acute encephalopathy- with baseline dementia  -checked UA and neg   -procal was neg     SIRS- tachycardia, wbc inc'd on 10/15, unclear etiology  -trended labs    Suspected Acute blood loss anemia- due to possible GIB  -1u prbc given 10/14, 10/15  -monitored h/h     HLD-held statin  Chronic uti- held trimpex  MISC- Held asa     DVT Prophylaxis: scd  Diet: Diet NPO Exceptions are: Ice Chips, Sips of Water with Meds  Code Status: DNR-CC  PT/OT Eval Status: not ordered    Dispo - ?by Wed, pending palliative care recs, GI recs    Appropriate for A1 Discharge Unit: No      Thomes Cake, MD

## 2021-10-08 NOTE — Plan of Care (Signed)
SLP completed evaluation. Please refer to notes in EMR.      Monet North L. Nataniel Gasper, MA CCC-SLP SP#[id number]  Speech-Language Pathologist

## 2021-10-08 NOTE — Consults (Signed)
Gastroenterology Consult Note    Patient:   Lauren Bryant   Date of Birth:  01-31-1924   Facility:   [facility]   Referring/PCP: [name], APRN - CNP  Date:     10/08/2021  Consultant:   Megan Salon, MD, MD    Subjective:     This 87 y.o. female was admitted 10/06/2021 with a diagnosis of "Acute blood loss anemia [D62]  GIB (gastrointestinal bleeding) [K92.2]  Elevated troponin [R77.8]  Altered mental status, unspecified altered mental status type [R41.82]  Gastrointestinal hemorrhage, unspecified gastrointestinal hemorrhage type [K92.2]  Atrial flutter, unspecified type (HCC) [I48.92]" and is seen in consultation regarding "melena". Information was obtained from interview of  the patient and the patient's daughter, examination of the patient, and review of records. I did  update the past medical, surgical, social and / or family history.    Melena for days mild in GI tract assoc w anemia    Current status  Present  Diet Order: Diet NPO Exceptions are: Ice Chips, Sips of Water with Meds and she is tolerating diet.  Recently, she has experienced no abdominal  Pain and she has not required Intravenous narcotic analgesics.  The patient has also experienced no constipation, diarrhea, hematochezia, nausea, and vomiting      Prior to Admission medications    Medication Sig Start Date End Date Taking? Authorizing Provider   ALPRAZolam (XANAX) 0.25 MG tablet Take 0.25 mg by mouth 3 times daily as needed for Anxiety or Sleep.    Historical Provider, MD   trimethoprim (TRIMPEX) 100 MG tablet Take 1 tablet by mouth daily 05/03/21   [name], APRN - CNP   simvastatin (ZOCOR) 20 MG tablet Take 1 tablet by mouth nightly 05/02/21   [name], APRN - CNP   venlafaxine (EFFEXOR XR) 37.5 MG extended release capsule TAKE 1 CAPSULE TWICE A DAY 04/06/21   Taya Jeraldine Loots, APRN - CNP   docusate sodium (COLACE) 100 MG capsule Take 100 mg by mouth 2 times daily    Historical Provider, MD    ferrous sulfate 325 (65 Fe) MG tablet Take 325 mg by mouth daily (with breakfast)    Historical Provider, MD   aspirin 81 MG tablet Take 81 mg by mouth daily    Historical Provider, MD      Scheduled Medications:    QUEtiapine  12.5 mg Oral Daily    [START ON 10/09/2021] pantoprazole (PROTONIX) 40 mg injection  40 mg IntraVENous Daily    venlafaxine  37.5 mg Oral BID    sodium chloride flush  5-40 mL IntraVENous 2 times per day    enoxaparin  30 mg SubCUTAneous Daily    sodium chloride flush  5-40 mL IntraVENous 2 times per day     Infusions:    sodium chloride      sodium chloride      pantoprazole 8 mg/hr (10/07/21 0858)    sodium chloride      sodium chloride       PRN Medications: sodium chloride, haloperidol lactate, sodium chloride, sodium chloride flush, sodium chloride, ondansetron **OR** ondansetron, sodium chloride flush, sodium chloride, polyethylene glycol, acetaminophen **OR** acetaminophen  Allergies:   Allergies   Allergen Reactions    Macrobid [Nitrofurantoin] Rash       Past Medical History:   Diagnosis Date    Compression fracture of body of thoracic vertebra (HCC)     Frequent UTI     Hyperlipidemia  TIA 2015     Past Surgical History:   Procedure Laterality Date    HYSTERECTOMY (CERVIX STATUS UNKNOWN)      TUBAL LIGATION         Social:   Social History     Tobacco Use    Smoking status: Former     Packs/day: 0.50     Types: Cigarettes     Quit date: 03/01/2020     Years since quitting: 1.6    Smokeless tobacco: Never   Substance Use Topics    Alcohol use: No     Family: History reviewed. No pertinent family history.    ROS: Pertinent items are noted in HPI.    Objective:   Vital Signs:  Temp (24hrs), Avg:98.5 ??F (36.9 ??C), Min:98.2 ??F (36.8 ??C), Max:98.8 ??F (37.1 ??C)     Systolic (24hrs), Avg:120 , Min:105 , Max:146      Diastolic (24hrs), Avg:67, Min:55, Max:87     Pulse  Avg: 92.7  Min: 73  Max: 108  BP 127/87    Pulse (!) 108    Temp 98.8 ??F (37.1 ??C) (Axillary)    Resp 24    Wt 100 lb  15.5 oz (45.8 kg)    LMP  (LMP Unknown)    SpO2 98%    BMI 16.80 kg/m??      Physical Exam:   BP 107/60    Pulse 81    Temp 98.3 ??F (36.8 ??C) (Axillary)    Resp 20    Wt 100 lb 15.5 oz (45.8 kg)    LMP  (LMP Unknown)    SpO2 98%    BMI 16.80 kg/m??   General appearance: Alert but confused, NAD  Lungs: clear to auscultation bilaterally  Chest wall: no tenderness  Heart: regular rate and rhythm, S1, S2 normal, no murmur, click, rub or gallop  Abdomen: soft, non-tender; bowel sounds normal; no masses,  no organomegaly  Extremities: extremities normal, atraumatic, no cyanosis or edema  Skin: Skin color, texture, turgor normal. No rashes or lesions  Neurologic: Grossly normal    Lab and Imaging Review   Recent Labs     10/06/21  1059 10/06/21  1132 10/06/21  2241 10/07/21  0618 10/07/21  1745 10/08/21  0623   WBC 8.0  --   --  11.1*  --  14.4*   HGB 6.0*  --  7.7* 7.3* 9.4* 9.4*   MCV 90.0  --   --  92.4  --  92.9   PLT 240  --   --  223  --  248   NA 137  --   --  140  --  138   K 4.2  --   --  4.2  --  3.7   CL 104  --   --  103  --  98*   CO2 26  --   --  24  --  19*   BUN 27*  --   --  18  --  15   CREATININE 0.7  --   --  0.6  --  0.6   GLUCOSE 126*  --   --  103*  --  85   CALCIUM 8.9  --   --  8.7  --  8.8   PROT 5.7*  --   --   --   --   --    LABALBU 3.6  --   --   --   --   --  AST 15  --   --   --   --   --    ALT 10  --   --   --   --   --    ALKPHOS 55  --   --   --   --   --    BILITOT 0.3  --   --   --   --   --    AMMONIA  --  <10*  --   --   --   --        Assessment:     Patient Active Problem List    Diagnosis Date Noted    GIB (gastrointestinal bleeding) 10/06/2021    Hyperlipidemia     Compression fracture of body of thoracic vertebra (HCC)     Frequent UTI     Pneumonia 03/20/2017     87 yo w dementia and Fe def anemia w black stools admitted w acute encephalopathy, improving, but found to have anemia and heme-pos stools. H/H 6/19, transfused.    Plan:   Supportive care  Plan for EGD prior to D/C as  discussed w daughter  No plans for colonoscopy  Will follow     Megan Salon, MD       (O) [phone]

## 2021-10-08 NOTE — Progress Notes (Signed)
Pts HR sustaining in the 140s. Heart rhythm going from SR/ST to Afib. Secure chat sent to provider. No new orders at this time. Will continue to monitor

## 2021-10-09 LAB — CBC WITH AUTO DIFFERENTIAL
Basophils %: 0.4 %
Basophils %: 1.1 %
Basophils Absolute: 0 10*3/uL (ref 0.0–0.2)
Basophils Absolute: 0.1 10*3/uL (ref 0.0–0.2)
Eosinophils %: 0.3 %
Eosinophils %: 0.8 %
Eosinophils Absolute: 0 10*3/uL (ref 0.0–0.6)
Eosinophils Absolute: 0.1 10*3/uL (ref 0.0–0.6)
Hematocrit: 30.5 % — ABNORMAL LOW (ref 36.0–48.0)
Hematocrit: 31.7 % — ABNORMAL LOW (ref 36.0–48.0)
Hemoglobin: 10 g/dL — ABNORMAL LOW (ref 12.0–16.0)
Hemoglobin: 9.7 g/dL — ABNORMAL LOW (ref 12.0–16.0)
Lymphocytes %: 3.3 %
Lymphocytes %: 5.1 %
Lymphocytes Absolute: 0.4 10*3/uL — ABNORMAL LOW (ref 1.0–5.1)
Lymphocytes Absolute: 0.5 10*3/uL — ABNORMAL LOW (ref 1.0–5.1)
MCH: 29 pg (ref 26.0–34.0)
MCH: 29.1 pg (ref 26.0–34.0)
MCHC: 31.4 g/dL (ref 31.0–36.0)
MCHC: 31.9 g/dL (ref 31.0–36.0)
MCV: 91.2 fL (ref 80.0–100.0)
MCV: 92.1 fL (ref 80.0–100.0)
MPV: 10.6 fL — ABNORMAL HIGH (ref 5.0–10.5)
MPV: 10.8 fL — ABNORMAL HIGH (ref 5.0–10.5)
Monocytes %: 8 %
Monocytes %: 9.3 %
Monocytes Absolute: 0.9 10*3/uL (ref 0.0–1.3)
Monocytes Absolute: 0.9 10*3/uL (ref 0.0–1.3)
Neutrophils %: 84.9 %
Neutrophils %: 86.8 %
Neutrophils Absolute: 8.5 10*3/uL — ABNORMAL HIGH (ref 1.7–7.7)
Neutrophils Absolute: 9.2 10*3/uL — ABNORMAL HIGH (ref 1.7–7.7)
Platelets: 225 10*3/uL (ref 135–450)
Platelets: 244 10*3/uL (ref 135–450)
RBC: 3.35 M/uL — ABNORMAL LOW (ref 4.00–5.20)
RBC: 3.45 M/uL — ABNORMAL LOW (ref 4.00–5.20)
RDW: 15.5 % — ABNORMAL HIGH (ref 12.4–15.4)
RDW: 15.6 % — ABNORMAL HIGH (ref 12.4–15.4)
WBC: 10 10*3/uL (ref 4.0–11.0)
WBC: 10.6 10*3/uL (ref 4.0–11.0)

## 2021-10-09 LAB — BLOOD GAS, VENOUS
Base Excess, Ven: -4.3 mmol/L — ABNORMAL LOW (ref <=3.0)
Carboxyhemoglobin: 2.4 % — ABNORMAL HIGH (ref 0.0–1.5)
HCO3, Venous: 19.6 mmol/L — ABNORMAL LOW (ref 23.0–29.0)
MetHgb, Ven: 0.8 % (ref <1.5)
O2 Sat, Ven: 66 %
TC02 (Calc), Ven: 21 mmol/L
pCO2, Ven: 31.8 mmHg — ABNORMAL LOW (ref 40.0–50.0)
pH, Ven: 7.408 (ref 7.350–7.450)
pO2, Ven: 34.7 mmHg (ref 25.0–40.0)

## 2021-10-09 LAB — EKG 12-LEAD
Atrial Rate: 101 {beats}/min
Atrial Rate: 166 {beats}/min
Q-T Interval: 318 ms
Q-T Interval: 370 ms
QRS Duration: 106 ms
QRS Duration: 108 ms
QTc Calculation (Bazett): 509 ms
QTc Calculation (Bazett): 531 ms
R Axis: 100 degrees
R Axis: 97 degrees
T Axis: -22 degrees
T Axis: -65 degrees
Ventricular Rate: 114 {beats}/min
Ventricular Rate: 168 {beats}/min

## 2021-10-09 LAB — BASIC METABOLIC PANEL
Anion Gap: 19 — ABNORMAL HIGH (ref 3–16)
Anion Gap: 19 — ABNORMAL HIGH (ref 3–16)
BUN: 18 mg/dL (ref 7–20)
BUN: 19 mg/dL (ref 7–20)
CO2: 19 mmol/L — ABNORMAL LOW (ref 21–32)
CO2: 18 mmol/L — ABNORMAL LOW (ref 21–32)
Calcium: 8.6 mg/dL (ref 8.3–10.6)
Calcium: 8.5 mg/dL (ref 8.3–10.6)
Chloride: 99 mmol/L (ref 99–110)
Chloride: 100 mmol/L (ref 99–110)
Creatinine: 0.7 mg/dL (ref 0.6–1.2)
Creatinine: 0.7 mg/dL (ref 0.6–1.2)
GFR African American: >60 (ref >60)
GFR African American: >60 (ref >60)
GFR Non-African American: >60 (ref >60)
GFR Non-African American: >60 (ref >60)
Glucose: 84 mg/dL (ref 70–99)
Glucose: 84 mg/dL (ref 70–99)
Potassium: 3.8 mmol/L (ref 3.5–5.1)
Potassium: 3.5 mmol/L (ref 3.5–5.1)
Sodium: 137 mmol/L (ref 136–145)
Sodium: 137 mmol/L (ref 136–145)

## 2021-10-09 LAB — TROPONIN
Troponin: 0.06 ng/mL — ABNORMAL HIGH (ref <0.01)
Troponin: 0.05 ng/mL — ABNORMAL HIGH (ref <0.01)

## 2021-10-09 LAB — PROCALCITONIN: Procalcitonin: 0.07 ng/mL (ref 0.00–0.15)

## 2021-10-09 LAB — MAGNESIUM: Magnesium: 2 mg/dL (ref 1.80–2.40)

## 2021-10-09 LAB — LACTIC ACID: Lactic Acid: 1.1 mmol/L (ref 0.4–2.0)

## 2021-10-09 MED ORDER — DIGOXIN 0.25 MG/ML IJ SOLN
0.25 MG/ML | Freq: Four times a day (QID) | INTRAMUSCULAR | Status: AC
Start: 2021-10-09 — End: 2021-10-09
  Administered 2021-10-09 – 2021-10-10 (×2): 250 ug via INTRAVENOUS

## 2021-10-09 MED ORDER — PERFLUTREN LIPID MICROSPHERE IV SUSP
Freq: Once | INTRAVENOUS | Status: DC | PRN
Start: 2021-10-09 — End: 2021-10-10

## 2021-10-09 MED ORDER — SODIUM CHLORIDE 0.9 % IV BOLUS
0.9 % | Freq: Once | INTRAVENOUS | Status: AC
Start: 2021-10-09 — End: 2021-10-09
  Administered 2021-10-09: 14:00:00 750 mL via INTRAVENOUS

## 2021-10-09 MED ORDER — SODIUM CHLORIDE 0.9 % IV BOLUS
0.9 % | Freq: Once | INTRAVENOUS | Status: AC
Start: 2021-10-09 — End: 2021-10-09
  Administered 2021-10-09: 12:00:00 250 mL via INTRAVENOUS

## 2021-10-09 MED ORDER — POTASSIUM CHLORIDE 10 MEQ/100ML IV SOLN
10 MEQ/0ML | Freq: Once | INTRAVENOUS | Status: AC
Start: 2021-10-09 — End: 2021-10-09
  Administered 2021-10-09: 20:00:00 10 meq via INTRAVENOUS

## 2021-10-09 MED ORDER — DIGOXIN 0.25 MG/ML IJ SOLN
0.25 MG/ML | Freq: Once | INTRAMUSCULAR | Status: AC
Start: 2021-10-09 — End: 2021-10-09
  Administered 2021-10-09: 13:00:00 500 ug via INTRAVENOUS

## 2021-10-09 MED FILL — DIGOXIN 0.25 MG/ML IJ SOLN: 0.25 MG/ML | INTRAMUSCULAR | Qty: 2

## 2021-10-09 MED FILL — POTASSIUM CHLORIDE 10 MEQ/100ML IV SOLN: 10 MEQ/0ML | INTRAVENOUS | Qty: 100

## 2021-10-09 MED FILL — HALOPERIDOL LACTATE 5 MG/ML IJ SOLN: 5 MG/ML | INTRAMUSCULAR | Qty: 1

## 2021-10-09 MED FILL — LOVENOX 30 MG/0.3ML IJ SOSY: 30 MG/0.3ML | INTRAMUSCULAR | Qty: 0.3

## 2021-10-09 MED FILL — PROTONIX 40 MG IV SOLR: 40 MG | INTRAVENOUS | Qty: 40

## 2021-10-09 NOTE — Progress Notes (Signed)
Speech Language Pathology  Facility/Department: [facility]  Dysphagia Daily Treatment Note    Recommendations:  Diet recommendation: NPO; Ice chips with SLP/RN only following stringent oral care, pasted toothbrushing with suction; Meds via alt means of nutrition    **Consider alternative means of nutrition versus more comfort-oriented level of care**  Risk management: Control risk factors for aspiration PNA by completing oral care 3-4x/day and increasing physical mobility as is medically feasible      NAME: Lauren Bryant  DOB: 11-Apr-1924  MRN: [id number]    Patient Diagnosis(es):   Patient Active Problem List    Diagnosis Date Noted    GIB (gastrointestinal bleeding) 10/06/2021    Hyperlipidemia     Compression fracture of body of thoracic vertebra (HCC)     Frequent UTI     Pneumonia 03/20/2017     Allergies:   Allergies   Allergen Reactions    Macrobid [Nitrofurantoin] Rash     Subjective: RN ok'd SLP entry and tx. Pt seen upright in bed.     Pain: The patient does not complain of pain     Current Diet: Diet NPO Exceptions are: Ice Chips, Sips of Water with Meds    Diet Tolerance:  Patient NPO     P.O. Trials:  Thin   X  X1 ice chip   X1 cup sip      Dysphagia Treatment and Impressions:  RN ok'd SLP entry and tx.   Pt seen upright in bed.   Pt trialed x1 ice chip. Pt did not round lips to spoon when ice chip presented. Pt allowed ice chip to come to rest in left lateral sulcus, and exhibited minimal oral manipulation of ice chip trial. Suspected premature loss of ice chip trial. With wet cough.   Pt requested cup sip of water.   Pt trialed x1 cup sip of water. Pt trialed cup sip herself. Anterior bolus loss, suspect reduced/impaired A-P bolus transit, suspect premature bolus loss into pharynx, imm wet cough, increased RR, increased WOB, and wet breath sounds upon inhalation. Unable to cough forcefully enough to clear suspected aspirated material.   PO trials stopped.    SLP rec to cont NPO; Ice  chips with SLP/RN only following stringent oral care, pasted toothbrushing with suction; Meds via alt means of nutrition.    Consider alternative means of nutrition versus more comfort-oriented level of care    ST to follow.     Dysphagia Goals:  Short Term Goals:  Timeframe for Short Term Goals: (3x/ wk for 1 week until 10/15/21)    Goal 1: The patient will tolerate repeat BSE when able for ongoing assessment  10/09/2021 : Goal addressed, see above. Ongoing, progressing.        Long Term Goals:   Timeframe for Long Term Goals: (1-2 weeks)  Goal 1: The patient will tolerate least restrictive diet with no clinical s/s of aspiration or worsening respiratory/pulmonary status  10/09/2021 : NPO       Speech/Language/Cog Goals: n/a        Recommendations:  Diet recommendation: NPO; Ice chips with SLP/RN only following stringent oral care, pasted toothbrushing with suction; Meds via alt means of nutrition    **Consider alternative means of nutrition versus more comfort-oriented level of care**  Risk management: Control risk factors for aspiration PNA by completing oral care 3-4x/day and increasing physical mobility as is medically feasible      Patient/Family/Caregiver Education: SLP educated the patient re: Role of  SLP, rationale for completion of assessment, anatomical components of swallow structures as they pertain to airway protection, results of assessment, recommendations, and POC   Pt Education Response: no evidence of learning and would benefit from ongoing education    Compensatory Strategies: Control risk factors for aspiration PNA by completing oral care 3-4x/day and increasing physical mobility as is medically feasible    Plan:    Continued Dysphagia treatment with goals per plan of care.    Discharge Recommendations: TBD    If pt discharges from hospital prior to Speech/Swallowing discharge, this note serves as tx and discharge summary.     Total Treatment Time / Charges     Time in Time out Total Time /  units   Cognitive Tx  -- -- --   Speech Tx -- -- --   Dysphagia Tx 1231 1246 15 mins / 1 unit      Signature:  Ashok Cordia CFY-SLP  Clinical Fellow  [phone]

## 2021-10-09 NOTE — Progress Notes (Signed)
Patient continues to be in atrial fib between 95-115. Will continue to monitor.

## 2021-10-09 NOTE — Progress Notes (Signed)
Gastroenterology Progress Note    Patient:   Lauren Bryant   Date of Birth:  09-Oct-1924   Facility:   [facility]   Referring/PCP: [name], APRN - CNP  Date:     10/09/2021  Consultant:   Megan Salon, MD, MD    Subjective:     This 87 y.o. female was admitted 10/06/2021 with a diagnosis of "Acute blood loss anemia [D62]  GIB (gastrointestinal bleeding) [K92.2]  Elevated troponin [R77.8]  Altered mental status, unspecified altered mental status type [R41.82]  Gastrointestinal hemorrhage, unspecified gastrointestinal hemorrhage type [K92.2]  Atrial flutter, unspecified type (HCC) [I48.92]" and is seen in consultation regarding "melena". Information was obtained from interview of  the patient and the patient's daughter, examination of the patient, and review of records. I did  update the past medical, surgical, social and / or family history.    Melena for days mild in GI tract assoc w anemia    Current status  Present  Diet Order: Diet NPO Exceptions are: Ice Chips, Sips of Water with Meds and she is tolerating diet.  Recently, she has experienced no abdominal  Pain and she has not required Intravenous narcotic analgesics.  The patient has also experienced no constipation, diarrhea, hematochezia, nausea, and vomiting      Prior to Admission medications    Medication Sig Start Date End Date Taking? Authorizing Provider   ALPRAZolam (XANAX) 0.25 MG tablet Take 0.25 mg by mouth 3 times daily as needed for Anxiety or Sleep.    Historical Provider, MD   trimethoprim (TRIMPEX) 100 MG tablet Take 1 tablet by mouth daily 05/03/21   [name], APRN - CNP   simvastatin (ZOCOR) 20 MG tablet Take 1 tablet by mouth nightly 05/02/21   [name], APRN - CNP   venlafaxine (EFFEXOR XR) 37.5 MG extended release capsule TAKE 1 CAPSULE TWICE A DAY 04/06/21   Taya Jeraldine Loots, APRN - CNP   docusate sodium (COLACE) 100 MG capsule Take 100 mg by mouth 2 times daily    Historical Provider, MD    ferrous sulfate 325 (65 Fe) MG tablet Take 325 mg by mouth daily (with breakfast)    Historical Provider, MD   aspirin 81 MG tablet Take 81 mg by mouth daily    Historical Provider, MD      Scheduled Medications:    QUEtiapine  12.5 mg Oral Daily    pantoprazole (PROTONIX) 40 mg injection  40 mg IntraVENous Daily    venlafaxine  37.5 mg Oral BID    sodium chloride flush  5-40 mL IntraVENous 2 times per day    enoxaparin  30 mg SubCUTAneous Daily    sodium chloride flush  5-40 mL IntraVENous 2 times per day     Infusions:    sodium chloride 50 mL/hr at 10/08/21 1730    sodium chloride      sodium chloride      pantoprazole 8 mg/hr (10/08/21 1016)    sodium chloride      sodium chloride       PRN Medications: sodium chloride, haloperidol lactate, sodium chloride, sodium chloride flush, sodium chloride, ondansetron **OR** ondansetron, sodium chloride flush, sodium chloride, polyethylene glycol, acetaminophen **OR** acetaminophen  Allergies:   Allergies   Allergen Reactions    Macrobid [Nitrofurantoin] Rash       Past Medical History:   Diagnosis Date    Compression fracture of body of thoracic vertebra (HCC)     Frequent UTI  Hyperlipidemia     TIA 2015     Past Surgical History:   Procedure Laterality Date    HYSTERECTOMY (CERVIX STATUS UNKNOWN)      TUBAL LIGATION         Social:   Social History     Tobacco Use    Smoking status: Former     Packs/day: 0.50     Types: Cigarettes     Quit date: 03/01/2020     Years since quitting: 1.6    Smokeless tobacco: Never   Substance Use Topics    Alcohol use: No     Family: History reviewed. No pertinent family history.    ROS: Pertinent items are noted in HPI.    Objective:   Vital Signs:  Temp (24hrs), Avg:98.3 ??F (36.8 ??C), Min:97.9 ??F (36.6 ??C), Max:98.8 ??F (37.1 ??C)     Systolic (24hrs), Avg:117 , Min:107 , Max:131      Diastolic (24hrs), Avg:65, Min:53, Max:87     Pulse  Avg: 88.5  Min: 56  Max: 116  BP 107/60    Pulse 81    Temp 98.3 ??F (36.8 ??C) (Axillary)    Resp  20    Wt 100 lb 15.5 oz (45.8 kg)    LMP  (LMP Unknown)    SpO2 98%    BMI 16.80 kg/m??      Physical Exam:   BP 107/60    Pulse 81    Temp 98.3 ??F (36.8 ??C) (Axillary)    Resp 20    Wt 100 lb 15.5 oz (45.8 kg)    LMP  (LMP Unknown)    SpO2 98%    BMI 16.80 kg/m??   General appearance: Alert but confused, NAD  Heart: regular rate and rhythm, S1, S2 normal, no murmur, click, rub or gallop  Abdomen: soft, non-tender; bowel sounds normal; no masses,  no organomegaly      Lab and Imaging Review   Recent Labs     10/06/21  1059 10/06/21  1132 10/06/21  2241 10/07/21  0618 10/07/21  1745 10/08/21  0623 10/08/21  2015   WBC 8.0  --   --  11.1*  --  14.4* 10.0   HGB 6.0*  --  7.7* 7.3* 9.4* 9.4* 9.7*   MCV 90.0  --   --  92.4  --  92.9 91.2   PLT 240  --   --  223  --  248 225   NA 137  --   --  140  --  138 137   K 4.2  --   --  4.2  --  3.7 3.8   CL 104  --   --  103  --  98* 99   CO2 26  --   --  24  --  19* 19*   BUN 27*  --   --  18  --  15 18   CREATININE 0.7  --   --  0.6  --  0.6 0.7   GLUCOSE 126*  --   --  103*  --  85 84   CALCIUM 8.9  --   --  8.7  --  8.8 8.6   PROT 5.7*  --   --   --   --   --   --    LABALBU 3.6  --   --   --   --   --   --    AST 15  --   --   --   --   --   --  ALT 10  --   --   --   --   --   --    ALKPHOS 55  --   --   --   --   --   --    BILITOT 0.3  --   --   --   --   --   --    AMMONIA  --  <10*  --   --   --   --   --          Assessment:     Patient Active Problem List    Diagnosis Date Noted    GIB (gastrointestinal bleeding) 10/06/2021    Hyperlipidemia     Compression fracture of body of thoracic vertebra (HCC)     Frequent UTI     Pneumonia 03/20/2017     87 yo w dementia and Fe def anemia w black stools admitted w acute encephalopathy, improving, but found to have anemia and heme-pos stools. H/H 6/19, transfused.    Plan:   Supportive care  Plan for EGD prior to D/C as discussed w daughter  No plans for colonoscopy  Will follow     Megan Salon, MD       (O) [phone]

## 2021-10-09 NOTE — Progress Notes (Signed)
Occupational Therapy  Facility/Department: [facility]  Occupational Therapy Initial Assessment    Name: Lauren Bryant  DOB: Jan 26, 1924  MRN: [id number]  Date of Service: 10/09/2021    Discharge Recommendations:  Subacute/Skilled Nursing Facility  OT Equipment Recommendations  Equipment Needed: No (defer)     Patient Diagnosis(es): The primary encounter diagnosis was Acute blood loss anemia. Diagnoses of Gastrointestinal hemorrhage, unspecified gastrointestinal hemorrhage type, Altered mental status, unspecified altered mental status type, Atrial flutter, unspecified type (HCC), and Elevated troponin were also pertinent to this visit.  Past Medical History:  has a past medical history of Compression fracture of body of thoracic vertebra (HCC), Frequent UTI, Hyperlipidemia, and TIA.  Past Surgical History:  has a past surgical history that includes Hysterectomy and Tubal ligation.    Treatment Diagnosis: decreased ADLs      Assessment   Performance deficits / Impairments: Decreased functional mobility ;Decreased safe awareness;Decreased balance;Decreased ADL status;Decreased cognition;Decreased posture;Decreased ROM;Decreased endurance;Decreased strength  Assessment: Patient is a 87 year old female who was admitted to [facility] on 10/06/21 with GI bleed. Patient was a poor historian and confused during the session. However patient's daughter was present and confirmed that up until 4 days ago she was able to ambulate with a rolling walker with supervision. PLOF requiring SBA to min-mod A with ADLs per daughters description of assistance levels, not responsible for IADLs. Pt with hx of multiple falls. Today the patient required moderate assistance x 2 for bed mobility, mod assist x 2 to stand and mod assist x 2 for stand pivot from bed<>chair. Dependent for ADLs including washing her face and donning socks. Pt required max physical, verbal, and visual guidance to move her BUE through 90 degree of shoulder flexion.  Patient functioning below baseline and would benefit from skilled therapy to address current deficits mentioned above. OT recommends that this patient receive skilled OT in the SNF setting, when medically stable, in order to address her therapy deficits and to help her maximize her safety and independence with all functional mobility. OT to continue to follow.  Treatment Diagnosis: decreased ADLs  Prognosis: Fair  Decision Making: High Complexity  REQUIRES OT FOLLOW-UP: Yes  Activity Tolerance  Activity Tolerance: Treatment limited secondary to decreased cognition  Activity Tolerance Comments: fair tol of session; improved participation with family present (daughter). see vitals.        Plan   Occupational Therapy Plan  Times Per Week: 3-5x per week  Current Treatment Recommendations: Strengthening, Balance training, Functional mobility training, Endurance training, Patient/Caregiver education & training, Self-Care / ADL, Safety education & training, Positioning, Cognitive reorientation     Restrictions  Restrictions/Precautions  Restrictions/Precautions: NPO, Fall Risk, Contact Precautions, Isolation  Position Activity Restriction  Other position/activity restrictions: MDRO, hx of dementia, aphasia    Subjective   General  Patient assessed for rehabilitation services?: Yes  Additional Pertinent Hx: per chart review, M/D "Per MD H&P: "87 y.o. female dementia who presented to [facility] with unresponsiveness. Pt was last normal on Monday and in the past 2 days, was noted to have some inc'd irritability and dec'd po intake. Pt was at the breakfast table and had period of unresponsivenss and caregiver called EMS. Pt has been having chronic dark stools of late but no nsaid use or hematemesis. -of note, pt had fall during summer time and diagnosed with compression fx. ER course: noted anemia, +FOBT, 1u prbc ordered."  Response to previous treatment: Patient with no complaints from previous session  Family /  Caregiver  Present: Yes (daughter)  Referring Practitioner: Thomes Cake, MD  Diagnosis: GIB (gastrointestinal bleeding  Subjective  Subjective: pt with difficulties verbalizing responses, with daughter present increased verbalizations.  General Comment  Comments: no pain reported     Social/Functional History  Social/Functional History  Lives With: Alone  Type of Home: Senior housing apartment ([place] [facility] independent/assisted living)  Home Access: Occupational psychologist Shower/Tub: Pension scheme manager: Handicap height  Bathroom Equipment: Grab bars in shower, Paediatric nurse, Commode  Home Equipment: [employer], Grab bars, Medical laboratory scientific officer, Environmental consultant, 4 wheeled  Has the patient had two or more falls in the past year or any fall with injury in the past year?: Yes (patient's daughter, who is a retired PT, said that the patient has "fallen quite a lot"  fallen 3 times in the past year. One of them led to a compression fracture.)  Receives Help From: Home health (she was receiving caregivers 8 hours a day)  ADL Assistance: Needs assistance  Bath: Moderate assistance (4 days ago she was able to take a shower with "a little assistance" mod-max A based on daughters description of bath with pt able to  wash arms and trunk, daughter assisting with front and back peri hygiene, legs, and back.)  Dressing: Supervision (per daughter, pt able to don clothing with increased assistance)  Grooming: Modified independent   Feeding: Modified independent   Toileting: Independent  Homemaking Assistance: Needs assistance ([employer] [place] [employer] completed)  Meal Prep: Total  Laundry: Total  Vacuuming: Total  Cleaning: Total  Homemaking Responsibilities: No  Ambulation Assistance: Independent (patient could walk to the dining hall with her rolling walker and supervision)  Transfer Assistance: Independent  Active Driver: No  Patient's Driver Info: daughter  Occupation: Retired  Additional Comments: Patient's daughter said that the patient was able to  independently ambulate in her apartment with rolling walker up until ~4 days ago.       Objective   Heart Rate: 71  Heart Rate Source: Monitor  BP: (!) 108/45  BP Location: Left upper arm  BP Method: Automatic  Patient Position: Semi fowlers  MAP (Calculated): 66  Resp: 20  SpO2: 98 %  O2 Device: None (Room air)  Comment: 105/58 seated in chair. 69 BPM 97% on room air. seated in the chair 5 minutes later 124/51 83 BPM          Observation/Palpation  Posture: Poor  Safety Devices  Type of Devices: All fall risk precautions in place;Call light within reach;Chair alarm in place;Gait belt;Patient at risk for falls;Left in chair;Nurse notified  Bed Mobility Training  Bed Mobility Training: Yes  Overall Level of Assistance: Moderate assistance;Assist X2  Rolling: Moderate assistance  Supine to Sit: Moderate assistance;Assist X2  Sit to Supine: Other (comment) (patient in chair at end of therapy session)  Scooting: Moderate assistance (to scoot to edge of bed)  Balance  Sitting: Impaired  Sitting - Static: Fair (occasional)  Sitting - Dynamic: Fair (occasional)  Standing: Impaired  Standing - Static: Poor  Standing - Dynamic: Poor  Transfer Training  Transfer Training: Yes  Overall Level of Assistance: Moderate assistance;Assist X2  Interventions: Safety awareness training;Tactile cues;Verbal cues;Demonstration  Sit to Stand: Moderate assistance;Assist X2  Stand to Sit: Moderate assistance;Assist X2 (poor eccentric control of hip extensors)  Bed to Chair: Moderate assistance;Assist X2 (with rolling walker)  Gait Training  Gait Training: Yes  Gait  Overall Level of Assistance: Moderate assistance;Assist X2  Interventions: Demonstration;Safety awareness training;Tactile cues;Verbal  cues;Visual cues  Base of Support: Center of gravity altered;Narrowed  Speed/Cadence: Pace decreased (< 100 feet/min);Shuffled;Slow  Step Length: Right shortened;Left shortened  Gait Abnormalities: Decreased step clearance  Distance (ft): 3 Feet  (no complaints of shortness of breath, chest pain or dizziness. 1 loss of balance, mod assist to correct. mod assist for RW placement.)  Assistive Device: Walker, rolling;Gait belt     AROM: Grossly decreased, non-functional  PROM: Generally decreased, functional  Strength: Grossly decreased, non-functional  Coordination: Grossly decreased, non-functional  ADL  Feeding: NPO  Grooming: Dependent/Total  Grooming Skilled Clinical Factors: washing face bed level  LE Dressing: Dependent/Total  LE Dressing Skilled Clinical Factors: don socks  Toileting: Dependent/Total  Toileting Skilled Clinical Factors: purewick,     Activity Tolerance  Activity Tolerance: Patient limited by fatigue;Patient limited by endurance;Treatment limited secondary to decreased cognition  Activity Tolerance Comments: Vitals: 105/58 seated in chair. 69 BPM 97% on room air. seated in the chair 5 minutes later 124/51 83 BPM        Vision  Vision: Impaired  Vision Exceptions: Wears glasses for reading;Wears glasses at all times  Hearing  Hearing: Exceptions to [facility]  Hearing Exceptions: Hard of hearing/hearing concerns;Left hearing aid  Cognition  Overall Cognitive Status: Exceptions  Arousal/Alertness: Delayed responses to stimuli  Following Commands: Follows one step commands with increased time;Follows one step commands with repetition  Attention Span: Attends with cues to redirect  Memory: Decreased short term memory  Safety Judgement: Decreased awareness of need for assistance;Decreased awareness of need for safety  Problem Solving: Decreased awareness of errors  Insights: Not aware of deficits  Initiation: Requires cues for all  Sequencing: Requires cues for all  Cognition Comment: patient's daughter said that she has short term memory deficits at baseline.  Orientation  Overall Orientation Status: Impaired  Orientation Level: Oriented to person;Oriented to place;Disoriented to time;Disoriented to situation                  Education Given To:  Family;Patient  Education Provided: Role of Therapy;Plan of Care;Transfer Training;Equipment;Fall Prevention Strategies;Precautions;Orientation;Family Education  Education Provided Comments: family education: role of OT, role of therapy, transfer training, safety with transfers, orthostatic BP, safety during hospital stay.  Education Method: Verbal  Barriers to Learning: Cognition  Education Outcome: Verbalized understanding;Demonstrated understanding;Continued education needed                        AM-PAC Score        AM-PAC Inpatient Daily Activity Raw Score: 7 (10/09/21 1215)  AM-PAC Inpatient ADL T-Scale Score : 20.13 (10/09/21 1215)  ADL Inpatient CMS 0-100% Score: 92.44 (10/09/21 1215)  ADL Inpatient CMS G-Code Modifier : CM (10/09/21 1215)  Goals  Short Term Goals  Time Frame for Short Term Goals: 1 week nless otherwise specified by 10/16/21  Short Term Goal 1: Pt will complete 1 grooming task with min A sitting or bed level  Short Term Goal 2: Pt will complete stand pivot to [facility] with min A x1  Short Term Goal 3: Pt will complete LBD with mod A  Short Term Goal 4: Pt will complete UB bathing with min A (trunk/BUE only)  Patient Goals   Patient goals : did not verbalize       Therapy Time   Individual Concurrent Group Co-treatment   Time In 0928         Time Out 1018         Minutes 50  Timed Code Treatment Minutes: 40 Minutes (10 minute evaluation)       [place] Lallie Strahm, OT     If pt is unable to be seen after this session, please let this note serve as discharge summary.  Please see case management note for discharge disposition.  Thank you.

## 2021-10-09 NOTE — Consults (Signed)
[facility]   CONSULTATION  [phone]        Reason for Consultation/Chief Complaint:  she cannot give history; mumbling and incoherent  Consulted new onset afib with RVR and hypotension    History of Present Illness:    Lauren Bryant is a 87 y.o. patient who presented to [facility] 10/06/21 with MS change. She has PMH frequent UTI, HLD, compression fx, TIA, mild AS, and severe MR.  ECHO 01/13/19 normal LV function; mild LVH; nml RV fcn; severe MR; mild AS (vel=2.6m/s; MPG=72mmHg); mild TR.   She presented after SNF staff noted increased irritability and unresponsiveness with decreased po intake. Also noted dark stools. Due to dementia I cannot get history. No family at bedside and history mainly from chart. She was diagnosed with GIB and given total 2U PRBC's (10/14 and 10/15). GI has been consulted and planning for EGD prior to d/c but no colonoscopy. She developed rapid afib early this AM and hypotensive. Placed in Trendelenburg position and given IVF and digoxin 0.5mg  IV x 1. EKG today afib RVR 168bpm; IRBB; PVC (EKG 10/13 NSR; PAC's); Second EKG today NSR; PAC's; afib at end of EKG. Head CT negative; CXR negative.. Labs: H/H=7.3/24/2, 10.0/31.7, BUN/Cr=19/0.7; K+ 3.5; TnT 0.03, 0.06. She cannot give ROS. I have been asked to provide consultation regarding further management and testing.      Past Medical History:   has a past medical history of Compression fracture of body of thoracic vertebra (HCC), Frequent UTI, Hyperlipidemia, and TIA.    Surgical History:   has a past surgical history that includes Hysterectomy and Tubal ligation.     Social History:   reports that she quit smoking about 19 months ago. Her smoking use included cigarettes. She smoked an average of .5 packs per day. She has never used smokeless tobacco. She reports that she does not drink alcohol.     Family History:  family history is not on file.     Home Medications:  Were reviewed and are listed in nursing record. and/or listed  below  Prior to Admission medications    Medication Sig Start Date End Date Taking? Authorizing Provider   ALPRAZolam (XANAX) 0.25 MG tablet Take 0.25 mg by mouth 3 times daily as needed for Anxiety or Sleep.    Historical Provider, MD   trimethoprim (TRIMPEX) 100 MG tablet Take 1 tablet by mouth daily 05/03/21   [name], APRN - CNP   simvastatin (ZOCOR) 20 MG tablet Take 1 tablet by mouth nightly 05/02/21   [name], APRN - CNP   venlafaxine (EFFEXOR XR) 37.5 MG extended release capsule TAKE 1 CAPSULE TWICE A DAY 04/06/21   Taya Jeraldine Loots, APRN - CNP   docusate sodium (COLACE) 100 MG capsule Take 100 mg by mouth 2 times daily    Historical Provider, MD   ferrous sulfate 325 (65 Fe) MG tablet Take 325 mg by mouth daily (with breakfast)    Historical Provider, MD   aspirin 81 MG tablet Take 81 mg by mouth daily    Historical Provider, MD        Allergies:  Macrobid [nitrofurantoin]     Review of Systems:   12 point ROS negative in all areas as listed below except as in HOPI  Constitutional, EENT, Cardiovascular, pulmonary, GI, GU, Musculoskeletal, skin, neurological, hematological, endocrine, Psychiatric    Physical Examination:    Vitals:    10/09/21 1116   BP: (!) 108/45   Pulse: 71  Resp: 20   Temp: 98.2 ??F (36.8 ??C)   SpO2: 98%    Weight: 100 lb 15.5 oz (45.8 kg)         General Appearance:  Confused and mumbling, appears stated age   Head:  Normocephalic, without obvious abnormality, atraumatic   Eyes:  PERRL, conjunctiva/corneas clear       Nose: Nares normal, no drainage or sinus tenderness   Throat: Lips, mucosa, and tongue normal   Neck: Supple, symmetrical, trachea midline, no adenopathy, thyroid: not enlarged, symmetric, no tenderness/mass/nodules, no carotid bruit or JVD       Lungs:   Clear to auscultation bilaterally, respirations unlabored   Chest Wall:  No tenderness or deformity   Heart:  +irregularly irregular and tachycardic; +II/VI SEM;  S1, S2 normal, no rub or gallop   Abdomen:    Soft, non-tender, bowel sounds active all four quadrants,  no masses, no organomegaly           Extremities: Extremities normal, atraumatic, no cyanosis or edema   Pulses: 2+ and symmetric   Skin: Skin color, texture, turgor normal, no rashes or lesions   Pysch: Unable to assess; +dementia   Neurologic: Non-focal        Labs  CBC:   Lab Results   Component Value Date/Time    WBC 10.6 10/09/2021 08:22 AM    RBC 3.45 10/09/2021 08:22 AM    HGB 10.0 10/09/2021 08:22 AM    HCT 31.7 10/09/2021 08:22 AM    MCV 92.1 10/09/2021 08:22 AM    RDW 15.5 10/09/2021 08:22 AM    PLT 244 10/09/2021 08:22 AM     CMP:    Lab Results   Component Value Date/Time    NA 137 10/09/2021 08:22 AM    K 3.5 10/09/2021 08:22 AM    K 3.7 10/08/2021 06:23 AM    CL 100 10/09/2021 08:22 AM    CO2 18 10/09/2021 08:22 AM    BUN 19 10/09/2021 08:22 AM    CREATININE 0.7 10/09/2021 08:22 AM    GFRAA >60 10/09/2021 08:22 AM    AGRATIO 1.7 10/06/2021 10:59 AM    LABGLOM >60 10/09/2021 08:22 AM    GLUCOSE 84 10/09/2021 08:22 AM    PROT 5.7 10/06/2021 10:59 AM    CALCIUM 8.5 10/09/2021 08:22 AM    BILITOT 0.3 10/06/2021 10:59 AM    ALKPHOS 55 10/06/2021 10:59 AM    AST 15 10/06/2021 10:59 AM    ALT 10 10/06/2021 10:59 AM     PT/INR:  No results found for: PTINR  Lab Results   Component Value Date    TROPONINI 0.06 (H) 10/09/2021       EKG:  I have reviewed EKG with the following interpretation:  Impression:  See HPI    Assessment:   Lauren Bryant is a 87 y.o. patient who presented to [facility] 10/06/21 with MS change. She has PMH frequent UTI, HLD, compression fx, TIA, mild AS, and severe MR.  ECHO 01/13/19 normal LV function; mild LVH; nml RV fcn; severe MR; mild AS (vel=2.50m/s; MPG=58mmHg); mild TR.   She presented after SNF staff noted increased irritability and unresponsiveness with decreased po intake. Also noted dark stools. Due to dementia I cannot get history. No family at bedside and history mainly from chart. She was diagnosed with GIB and given total 2U  PRBC's (10/14 and 10/15). GI has been consulted and planning for EGD prior to d/c but no colonoscopy. She developed  rapid afib early this AM and hypotensive. Placed in Trendelenburg position and given IVF and digoxin 0.5mg  IV x 1. EKG today afib RVR 168bpm; IRBB; PVC (EKG 10/13 NSR; PAC's); Second EKG today NSR; PAC's; afib at end of EKG. Head CT negative; CXR negative.. Labs: H/H=7.3/24/2, 10.0/31.7, BUN/Cr=19/0.7; K+ 3.5; TnT 0.03, 0.06.    Diagnosis of new-onset afib RVR and elevated TnT in elderly female with GIB and hx severe and mild AS on  prior ECHO.     Recs:  Currently rapid afib on tele and will give 2 more doses of IV digoxin for full loading.   Avoiding CCB or BB given hypotension earlier. If IV digoxin unable to help HR would recommend loading with IV amio gtt for HR control.   No AC due to recent GIB and PRBC transfusion.  Cancel ECHO. Will not change management and give age and co-morbidities she would not be candidate for any valve intervention.  Elevated TnT likely demand ischemia from tachycardia. No ischemia w/u. See #4  Palliative consult in place and I agree with appropriateness.       Patient Active Problem List   Diagnosis    Pneumonia    Hyperlipidemia    Compression fracture of body of thoracic vertebra (HCC)    Frequent UTI    GIB (gastrointestinal bleeding)       Thank you for allowing to [place] to participate in the care or Lauren Bryant. Further evaluation will be based upon the patient's clinical course and testing results.

## 2021-10-09 NOTE — Plan of Care (Signed)
Problem: Discharge Planning  Goal: Discharge to home or other facility with appropriate resources  Outcome: Progressing     Problem: Pain  Goal: Verbalizes/displays adequate comfort level or baseline comfort level  Outcome: Progressing     Problem: Skin/Tissue Integrity  Goal: Absence of new skin breakdown  Description: 1.  Monitor for areas of redness and/or skin breakdown  2.  Assess vascular access sites hourly  3.  Every 4-6 hours minimum:  Change oxygen saturation probe site  4.  Every 4-6 hours:  If on nasal continuous positive airway pressure, respiratory therapy assess nares and determine need for appliance change or resting period.  10/09/2021 2135 by Corky Sing, RN  Outcome: Progressing  10/09/2021 1741 by Marlowe Sax, RN  Outcome: Progressing     Problem: Safety - Adult  Goal: Free from fall injury  10/09/2021 2135 by Corky Sing, RN  Outcome: Progressing  10/09/2021 1741 by Marlowe Sax, RN  Outcome: Progressing     Problem: ABCDS Injury Assessment  Goal: Absence of physical injury  Outcome: Progressing     Problem: Chronic Conditions and Co-morbidities  Goal: Patient's chronic conditions and co-morbidity symptoms are monitored and maintained or improved  Outcome: Progressing

## 2021-10-09 NOTE — Progress Notes (Signed)
Hospitalist Progress Note      PCP: [name], APRN - CNP    Date of Admission: 10/06/2021    Chief Complaint:  unresponsive     Hospital Course: reviewed     Subjective: afib rvr, hypotension today, ivf bolus given , pt resting in bed and not interactive with author, currently on trendelenberg      Medications:  Reviewed    Infusion Medications    sodium chloride      sodium chloride      pantoprazole 8 mg/hr (10/08/21 1016)    sodium chloride      sodium chloride       Scheduled Medications    QUEtiapine  12.5 mg Oral Daily    pantoprazole (PROTONIX) 40 mg injection  40 mg IntraVENous Daily    venlafaxine  37.5 mg Oral BID    sodium chloride flush  5-40 mL IntraVENous 2 times per day    enoxaparin  30 mg SubCUTAneous Daily    sodium chloride flush  5-40 mL IntraVENous 2 times per day     PRN Meds: sodium chloride, haloperidol lactate, sodium chloride, sodium chloride flush, sodium chloride, ondansetron **OR** ondansetron, sodium chloride flush, sodium chloride, polyethylene glycol, acetaminophen **OR** acetaminophen      Intake/Output Summary (Last 24 hours) at 10/09/2021 0929  Last data filed at 10/09/2021 0036  Gross per 24 hour   Intake 440 ml   Output 700 ml   Net -260 ml       Physical Exam Performed:    BP 82/63    Pulse (!) 132    Temp 97.7 ??F (36.5 ??C) (Axillary)    Resp 20    Wt 100 lb 15.5 oz (45.8 kg)    LMP  (LMP Unknown)    SpO2 96%    BMI 16.80 kg/m??     General appearance:  No apparent distress, appears stated age and cooperative. Frail appearing  HEENT:  Normal cephalic, atraumatic without obvious deformity. Pupils equal, round, and reactive to light.  Extra ocular muscles intact. Conjunctivae/corneas clear.  Neck: Supple, with full range of motion. No jugular venous distention. Trachea midline.  Respiratory:  Normal respiratory effort. Clear to auscultation, bilaterally without Rales/Wheezes/Rhonchi.  Cardiovascular:  Regular rate and rhythm with normal S1/S2 without murmurs, rubs or  gallops.  Abdomen: Soft, non-tender, non-distended with normal bowel sounds.  Musculoskeletal:  No clubbing, cyanosis or edema bilaterally.  Full range of motion without deformity.  Skin: Skin color, texture, turgor normal.  No rashes or lesions.  Neurologic:  Neurovascularly intact without any focal sensory/motor deficits. Cranial nerves: II-XII intact, grossly non-focal.  Psychiatric:  Alert and oriented x1, thought content not appropriate, not normal insight  Capillary Refill: Brisk,3 seconds, normal  Peripheral Pulses: +2 palpable, equal bilaterally       Labs:   Recent Labs     10/08/21  0623 10/08/21  2015 10/09/21  0822   WBC 14.4* 10.0 10.6   HGB 9.4* 9.7* 10.0*   HCT 30.9* 30.5* 31.7*   PLT 248 225 244     Recent Labs     10/08/21  0623 10/08/21  2015 10/09/21  0822   NA 138 137 137   K 3.7 3.8 3.5   CL 98* 99 100   CO2 19* 19* 18*   BUN 15 18 19    CREATININE 0.6 0.7 0.7   CALCIUM 8.8 8.6 8.5     Recent Labs     10/06/21  1059  AST 15   ALT 10   BILITOT 0.3   ALKPHOS 55     No results for input(s): INR in the last 72 hours.  Recent Labs     10/06/21  1059 10/09/21  0822   TROPONINI 0.03* 0.06*       Urinalysis:      Lab Results   Component Value Date/Time    NITRU Negative 10/07/2021 09:50 AM    WBCUA 87 09/24/2021 03:21 AM    BACTERIA 4+ 09/24/2021 03:21 AM    RBCUA 1 09/24/2021 03:21 AM    BLOODU Negative 10/07/2021 09:50 AM    SPECGRAV 1.020 10/07/2021 09:50 AM    GLUCOSEU Negative 10/07/2021 09:50 AM       Radiology:  CT HEAD WO CONTRAST   Final Result   No acute intracranial abnormality.  There is no change from prior examination.         XR CHEST PORTABLE   Final Result   1.  No acute abnormality.                 Assessment/Plan:    Active Hospital Problems    Diagnosis     GIB (gastrointestinal bleeding) [K92.2]      Priority: Medium        Possible GIB - POA, +FOBT  -GI consulted, possible egd 10/17  -PPI ggt  -Trended h/h  -1u prbc in ER   -1u 10/14     Abn EKG- concerns for atrial flutter, noted  AFib rvr on 10/16 am  -on tele   -cards consulted, apprec recs  -echo ordered  -iv digoxin given  -trended ces    Acute encephalopathy- with baseline dementia  -checked UA and neg   -procal was neg     SIRS- tachycardia, wbc inc'd on 10/15, unclear etiology  -trended labs, lactate wnl   -UA neg, cxr neg    Suspected Acute blood loss anemia- due to possible GIB  -1u prbc given 10/14, 10/15  -monitored h/h     HLD-held statin  Chronic uti- held trimpex  MISC- Held asa     DVT Prophylaxis: scd  Diet: Diet NPO Exceptions are: Ice Chips, Sips of Water with Meds  Code Status: DNR-CC  PT/OT Eval Status: ordered, rec snf    Dispo - pending workup, trial ivf bolus, cards consulted, egd 10/17, trend ces    Appropriate for A1 Discharge Unit: No      Thomes Cake, MD

## 2021-10-09 NOTE — Progress Notes (Signed)
Placed patient in Trendelenburg position. Blood pressure  82/61.

## 2021-10-09 NOTE — Progress Notes (Signed)
Physical Therapy  Facility/Department: [facility]  Physical Therapy Initial Assessment and Treatment    Name: MERCEDEZ BOULE  DOB: December 23, 1924  MRN: [id number]  Date of Service: 10/09/2021    Discharge Recommendations:  Subacute/Skilled Nursing Facility   PT Equipment Recommendations  Equipment Needed: No  Other: defer to patient's facility      Patient Diagnosis(es): The primary encounter diagnosis was Acute blood loss anemia. Diagnoses of Gastrointestinal hemorrhage, unspecified gastrointestinal hemorrhage type, Altered mental status, unspecified altered mental status type, Atrial flutter, unspecified type (HCC), and Elevated troponin were also pertinent to this visit.  Past Medical History:  has a past medical history of Compression fracture of body of thoracic vertebra (HCC), Frequent UTI, Hyperlipidemia, and TIA.  Past Surgical History:  has a past surgical history that includes Hysterectomy and Tubal ligation.  If pt is unable to be seen after this session, please let this note serve as discharge summary.  Please see case management note for discharge disposition.  Thank you.   Barriers to home discharge:   [x]  Reported available assist at home upon discharge limited   [x]  Patient or family requests DC to other than home   Assessment   Body Structures, Functions, Activity Limitations Requiring Skilled Therapeutic Intervention: Decreased functional mobility ;Decreased ADL status;Decreased strength;Decreased safe awareness;Decreased cognition;Decreased endurance;Decreased balance;Decreased high-level IADLs;Decreased posture  Assessment: Patient is a 87 year old female who was admitted to [facility] on 10/06/21 with GI bleed. Patient was a poor historian and confused during the session. However patient's daughter was present and confirmed that up until 4 days ago she was able to ambulate with a rolling walker with supervision. Today the patient required moderate assistance x 2 for bed mobility, mod assist x 2 to  stand and mod assist x 2 to ambulate 3 feet with a rolling walker. She completed her exercises with guidance. Patient functioning below baseline and would benefit from skilled therapy to address current deficits mentioned above. PT recommends that this patient receive skilled PT in the SNF setting, when medically stable, in order to address her therapy deficits and to help her maximize her safety and independence with all functional mobility. PT to continue to follow.  Treatment Diagnosis: decreased independence with functional mobility  Specific Instructions for Next Treatment: progress mobility as tolerated  Therapy Prognosis: Good  Decision Making: Medium Complexity  Barriers to Learning: hard of hearing, cognition.  Requires PT Follow-Up: Yes  Activity Tolerance  Activity Tolerance: Patient limited by fatigue;Patient limited by endurance;Treatment limited secondary to decreased cognition  Activity Tolerance Comments: Vitals: 105/58 seated in chair. 69 BPM 97% on room air. seated in the chair 5 minutes later 124/51 83 BPM     Plan   Physcial Therapy Plan  General Plan: 3-5 times per week  Therapy Duration: 1 Week (10/17/21)  Specific Instructions for Next Treatment: progress mobility as tolerated  Current Treatment Recommendations: Strengthening, Balance training, Functional mobility training, Transfer training, Endurance training, Gait training, Patient/Caregiver education & training, Safety education & training, Equipment evaluation, education, & procurement, Home exercise program, Therapeutic activities  Safety Devices  Type of Devices: All fall risk precautions in place, Call light within reach, Chair alarm in place, Gait belt, Patient at risk for falls, Left in chair, Nurse notified (patient's daughter at bedside)     Restrictions  Restrictions/Precautions  Restrictions/Precautions: NPO, Fall Risk, Contact Precautions, Isolation  Position Activity Restriction  Other position/activity restrictions: MDRO, hx  of dementia, aphasia     Subjective  Pain: No pain.  General  Chart Reviewed: Yes  Patient assessed for rehabilitation services?: Yes  Additional Pertinent Hx: HPI per chart, "87 y.o. female dementia who presented to [facility] with unresponsiveness. Pt was last normal on Monday and in the past 2 days, was noted to have some inc'd irritability and dec'd po intake. Pt was at the breakfast table and had period of unresponsivenss and caregiver called EMS. Pt has been having chronic dark stools of late but no nsaid use or hematemesis.  -of note, pt had fall during summer time and diagnosed with compression fx."  Response To Previous Treatment: Not applicable  Family / Caregiver Present: Yes (patient daughter present)  Referring Practitioner: Thomes Cake, MD  Referral Date : 10/08/21  Follows Commands: Impaired  General Comment  Comments: Supine in bed upon entry of therapy staff. Cleared for therapy by RN.  Subjective  Subjective: Patient agreed to participate.         Social/Functional History  Social/Functional History  Lives With: Alone  Type of Home: Senior housing apartment ([facility] [place] [facility] independent/assisted living)  Home Access: Occupational psychologist Shower/Tub: Pension scheme manager: Handicap height  Bathroom Equipment: Grab bars in shower, Paediatric nurse, Commode  Home Equipment: [employer], Grab bars, Medical laboratory scientific officer, Environmental consultant, 4 wheeled  Has the patient had two or more falls in the past year or any fall with injury in the past year?: Yes (patient's daughter, who is a retired PT, said that the patient has "fallen quite a lot"  fallen 3 times in the past year. One of them led to a compression fracture.)  Receives Help From: Home health (she was receiving caregivers 8 hours a day)  ADL Assistance: Needs assistance  Bath: Moderate assistance (4 days ago she was able to take a shower with "a little assistance" mod-max A based on daughters description of bath with pt able to  wash arms and trunk,  daughter assisting with front and back peri hygiene, legs, and back.)  Dressing: Supervision (per daughter, pt able to don clothing with increased assistance)  Grooming: Modified independent   Feeding: Modified independent   Toileting: Independent  Homemaking Assistance: Needs assistance ([employer] [employer] completed)  Meal Prep: Total  Laundry: Total  Vacuuming: Total  Cleaning: Total  Homemaking Responsibilities: No  Ambulation Assistance: Independent (patient could walk to the dining hall with her rolling walker and supervision)  Transfer Assistance: Independent  Active Driver: No  Patient's Driver Info: daughter  Occupation: Retired  Additional Comments: Patient's daughter said that the patient was able to independently ambulate in her apartment with rolling walker up until ~4 days ago.  Vision/Hearing  Vision  Vision: Impaired  Vision Exceptions: Wears glasses for reading;Wears glasses at all times  Hearing  Hearing: Exceptions to [facility]  Hearing Exceptions: Hard of hearing/hearing concerns;Left hearing aid    Cognition   Orientation  Overall Orientation Status: Impaired  Orientation Level: Oriented to person;Oriented to place;Disoriented to time;Disoriented to situation  Cognition  Overall Cognitive Status: Exceptions  Arousal/Alertness: Delayed responses to stimuli  Following Commands: Follows one step commands with increased time;Follows one step commands with repetition  Attention Span: Attends with cues to redirect  Memory: Decreased short term memory  Safety Judgement: Decreased awareness of need for assistance;Decreased awareness of need for safety  Problem Solving: Decreased awareness of errors  Insights: Not aware of deficits  Initiation: Requires cues for all  Sequencing: Requires cues for all  Cognition Comment: patient's daughter said that she has  short term memory deficits at baseline.     Objective   Heart Rate: 71  Heart Rate Source: Monitor  BP: (!) 108/45  BP Location: Left upper arm  BP Method:  Automatic  Patient Position: Semi fowlers  MAP (Calculated): 66  Resp: 20  SpO2: 98 %  O2 Device: None (Room air)  Comment: 105/58 seated in chair. 69 BPM 97% on room air. seated in the chair 5 minutes later 124/51 83 BPM     Observation/Palpation  Posture: Poor  Gross Assessment  AROM: Generally decreased, functional  PROM: Within functional limits  Strength: Generally decreased, functional (3+/5 bilateral)  Sensation: Intact                 Bed Mobility Training  Bed Mobility Training: Yes  Overall Level of Assistance: Moderate assistance;Assist X2  Rolling: Moderate assistance  Supine to Sit: Moderate assistance;Assist X2  Sit to Supine: Other (comment) (patient in chair at end of therapy session)  Scooting: Moderate assistance (to scoot to edge of bed)  Balance  Sitting: Impaired  Sitting - Static: Fair (occasional)  Sitting - Dynamic: Fair (occasional)  Standing: Impaired  Standing - Static: Poor  Standing - Dynamic: Poor  Transfer Training  Transfer Training: Yes  Overall Level of Assistance: Moderate assistance;Assist X2  Interventions: Safety awareness training;Tactile cues;Verbal cues;Demonstration  Sit to Stand: Moderate assistance;Assist X2  Stand to Sit: Moderate assistance;Assist X2 (poor eccentric control of hip extensors)  Bed to Chair: Moderate assistance;Assist X2 (with rolling walker)  Gait Training  Gait Training: Yes  Gait  Overall Level of Assistance: Moderate assistance;Assist X2  Interventions: Demonstration;Safety awareness training;Tactile cues;Verbal cues;Visual cues  Base of Support: Center of gravity altered;Narrowed  Speed/Cadence: Pace decreased (< 100 feet/min);Shuffled;Slow  Step Length: Right shortened;Left shortened  Gait Abnormalities: Decreased step clearance  Distance (ft): 3 Feet (no complaints of shortness of breath, chest pain or dizziness. 1 loss of balance, mod assist to correct. mod assist for RW placement.)  Assistive Device: Walker, rolling;Gait belt               Balance  Posture: Fair  Sitting - Static: Fair  Sitting - Dynamic: Fair  Standing - Static: Poor  Standing - Dynamic: Poor  Comments: with rolling walker.  Exercise Treatment: 1 x 10 AROM bilateral: ankle pumps, long arc quads, hip flexion. Patient needed extensive verbal and tactile cueing for sequencing and technique.      AM-PAC Score     AM-PAC Inpatient Mobility without Stair Climbing Raw Score : 9 (10/09/21 1211)  AM-PAC Inpatient without Stair Climbing T-Scale Score : 32.44 (10/09/21 1211)  Mobility Inpatient CMS 0-100% Score: 76.07 (10/09/21 1211)  Mobility Inpatient without Stair CMS G-Code Modifier : CL (10/09/21 1211)         Goals  Short Term Goals  Time Frame for Short Term Goals: 1 week 10/16/21  Short Term Goal 1: Supine <> sit with minimum assistance.  Short Term Goal 2: Sit <> stand with moderate assistance.  Short Term Goal 3: Bed <> chair with least restrictive assistive device and moderate assist  Short Term Goal 4: Ambulate 50 feet with least restrictive assistive device and moderate assist  Short Term Goal 5: By 10/12/21 Patient will tolerate 12-15 reps of her exercises to maximize her strength/endurance  Patient Goals   Patient Goals : To sit in the chair. Patient's past medical history includes dementia. Patient's daughter requested that patient receive additional therapy at a SNF upon discharge.  Education  Patient Education  Education Given To: Patient  Education Provided: Role of Therapy;Plan of Care;Home Exercise Program;Fall Prevention Strategies;Transfer Training;Equipment  Education Provided Comments: Disease Specific Education: Patient educated on importance of out of bed mobility, importance of completing her exercises, prevention of complications of bedrest, and general safety (importance of using call bell) during hospitalization.  Education Method: Demonstration;Verbal  Barriers to Learning: Cognition;Hearing  Education Outcome: Verbalized understanding;Continued education  needed      Therapy Time   Individual Concurrent Group Co-treatment   Time In 0928         Time Out 1018         Minutes 50         Timed Code Treatment Minutes: 40 Minutes (10 minute evaluation)       Gretta Cool, PT

## 2021-10-09 NOTE — Progress Notes (Signed)
Blood pressure better now at 109/57. Heart rate is now done to 103. Heart rate is between 97-115. Will continue to monitor.

## 2021-10-09 NOTE — Progress Notes (Signed)
Dr. Angela Adam to see patient. New orders noted. Will continue to monitor blood pressure and heart rate.  Saline bolus started at 0759.

## 2021-10-09 NOTE — Plan of Care (Signed)
Increase patients ADLs/functional status to baseline.

## 2021-10-09 NOTE — Progress Notes (Signed)
Paged MD regarding patient's heart rate of 160-190's. Blood pressure 76/47. Patient alert. Stat EKG shows atrial fib/RVR. Waiting for return call.

## 2021-10-09 NOTE — Plan of Care (Signed)
Problem: Discharge Planning  Goal: Discharge to home or other facility with appropriate resources  Outcome: Progressing     Problem: Pain  Goal: Verbalizes/displays adequate comfort level or baseline comfort level  Outcome: Progressing     Problem: Skin/Tissue Integrity  Goal: Absence of new skin breakdown  Description: 1.  Monitor for areas of redness and/or skin breakdown  2.  Assess vascular access sites hourly  3.  Every 4-6 hours minimum:  Change oxygen saturation probe site  4.  Every 4-6 hours:  If on nasal continuous positive airway pressure, respiratory therapy assess nares and determine need for appliance change or resting period.  Outcome: Progressing     Problem: Safety - Adult  Goal: Free from fall injury  Outcome: Progressing     Problem: ABCDS Injury Assessment  Goal: Absence of physical injury  Outcome: Progressing     Problem: Chronic Conditions and Co-morbidities  Goal: Patient's chronic conditions and co-morbidity symptoms are monitored and maintained or improved  Outcome: Progressing

## 2021-10-09 NOTE — Plan of Care (Signed)
Problem: Skin/Tissue Integrity  Goal: Absence of new skin breakdown  Description: 1.  Monitor for areas of redness and/or skin breakdown  2.  Assess vascular access sites hourly  3.  Every 4-6 hours minimum:  Change oxygen saturation probe site  4.  Every 4-6 hours:  If on nasal continuous positive airway pressure, respiratory therapy assess nares and determine need for appliance change or resting period.  10/09/2021 1741 by Marlowe Sax, RN  Outcome: Progressing     Problem: Safety - Adult  Goal: Free from fall injury  10/09/2021 1741 by Marlowe Sax, RN  Outcome: Progressing

## 2021-10-09 NOTE — Progress Notes (Signed)
Consult placed    Who: [facility]  Date:10/09/2021,  Time:8:27 AM        Electronically signed by Marlowe Sax, RN on 10/09/2021 at 8:27 AM

## 2021-10-09 NOTE — Plan of Care (Signed)
Increase level of function to baseline.

## 2021-10-10 LAB — COMPREHENSIVE METABOLIC PANEL W/ REFLEX TO MG FOR LOW K
ALT: 19 U/L (ref 10–40)
AST: 34 U/L (ref 15–37)
Albumin/Globulin Ratio: 1.8 (ref 1.1–2.2)
Albumin: 3.5 g/dL (ref 3.4–5.0)
Alkaline Phosphatase: 51 U/L (ref 40–129)
Anion Gap: 13 (ref 3–16)
BUN: 16 mg/dL (ref 7–20)
CO2: 22 mmol/L (ref 21–32)
Calcium: 8.9 mg/dL (ref 8.3–10.6)
Chloride: 102 mmol/L (ref 99–110)
Creatinine: 0.6 mg/dL (ref 0.6–1.2)
GFR African American: >60 (ref >60)
GFR Non-African American: >60 (ref >60)
Glucose: 67 mg/dL — ABNORMAL LOW (ref 70–99)
Potassium reflex Magnesium: 4.2 mmol/L (ref 3.5–5.1)
Sodium: 137 mmol/L (ref 136–145)
Total Bilirubin: 0.6 mg/dL (ref 0.0–1.0)
Total Protein: 5.4 g/dL — ABNORMAL LOW (ref 6.4–8.2)

## 2021-10-10 LAB — CBC WITH AUTO DIFFERENTIAL
Basophils %: 0.8 %
Basophils Absolute: 0.1 10*3/uL (ref 0.0–0.2)
Eosinophils %: 1.9 %
Eosinophils Absolute: 0.2 10*3/uL (ref 0.0–0.6)
Hematocrit: 32.3 % — ABNORMAL LOW (ref 36.0–48.0)
Hemoglobin: 10.1 g/dL — ABNORMAL LOW (ref 12.0–16.0)
Lymphocytes %: 5.9 %
Lymphocytes Absolute: 0.6 10*3/uL — ABNORMAL LOW (ref 1.0–5.1)
MCH: 29 pg (ref 26.0–34.0)
MCHC: 31.4 g/dL (ref 31.0–36.0)
MCV: 92.4 fL (ref 80.0–100.0)
MPV: 10 fL (ref 5.0–10.5)
Monocytes %: 6 %
Monocytes Absolute: 0.6 10*3/uL (ref 0.0–1.3)
Neutrophils %: 85.4 %
Neutrophils Absolute: 9 10*3/uL — ABNORMAL HIGH (ref 1.7–7.7)
Platelets: 242 10*3/uL (ref 135–450)
RBC: 3.49 M/uL — ABNORMAL LOW (ref 4.00–5.20)
RDW: 15.5 % — ABNORMAL HIGH (ref 12.4–15.4)
WBC: 10.5 10*3/uL (ref 4.0–11.0)

## 2021-10-10 LAB — POCT GLUCOSE
POC Glucose: 64 mg/dl — ABNORMAL LOW (ref 70–99)
POC Glucose: 76 mg/dl (ref 70–99)
POC Glucose: 99 mg/dl (ref 70–99)

## 2021-10-10 LAB — EKG 12-LEAD
Atrial Rate: 89 {beats}/min
P Axis: 29 degrees
P-R Interval: 154 ms
Q-T Interval: 356 ms
QRS Duration: 108 ms
QTc Calculation (Bazett): 433 ms
R Axis: 86 degrees
T Axis: -62 degrees
Ventricular Rate: 89 {beats}/min

## 2021-10-10 MED ORDER — MELATONIN 5 MG PO TBDP
5 MG | Freq: Every evening | ORAL | Status: DC | PRN
Start: 2021-10-10 — End: 2021-10-14

## 2021-10-10 MED ORDER — DIGOXIN 0.25 MG/ML IJ SOLN
0.25 MG/ML | Freq: Every day | INTRAMUSCULAR | Status: DC
Start: 2021-10-10 — End: 2021-10-12
  Administered 2021-10-10 – 2021-10-11 (×2): 125 ug via INTRAVENOUS

## 2021-10-10 MED ORDER — DEXTROSE IN LACTATED RINGERS 5 % IV SOLN
5 % | INTRAVENOUS | Status: DC
Start: 2021-10-10 — End: 2021-10-12
  Administered 2021-10-10 – 2021-10-12 (×3): via INTRAVENOUS

## 2021-10-10 MED ORDER — DIGOXIN 125 MCG PO TABS
125 MCG | Freq: Every day | ORAL | Status: DC
Start: 2021-10-10 — End: 2021-10-10

## 2021-10-10 MED ORDER — PANTOPRAZOLE SODIUM 40 MG IV SOLR
40 MG | Freq: Two times a day (BID) | INTRAVENOUS | Status: DC
Start: 2021-10-10 — End: 2021-10-14
  Administered 2021-10-11 – 2021-10-13 (×6): 40 mg via INTRAVENOUS

## 2021-10-10 MED FILL — DIGOXIN 0.25 MG/ML IJ SOLN: 0.25 MG/ML | INTRAMUSCULAR | Qty: 2

## 2021-10-10 MED FILL — DEXTROSE IN LACTATED RINGERS 5 % IV SOLN: 5 % | INTRAVENOUS | Qty: 1000

## 2021-10-10 MED FILL — LOVENOX 30 MG/0.3ML IJ SOSY: 30 MG/0.3ML | INTRAMUSCULAR | Qty: 0.3

## 2021-10-10 MED FILL — PROTONIX 40 MG IV SOLR: 40 MG | INTRAVENOUS | Qty: 40

## 2021-10-10 NOTE — Progress Notes (Signed)
Speech Language Pathology  Facility/Department: [facility]  Dysphagia Daily Treatment Note    Recommendations:  Diet recommendation: NPO; Ice chips or small amounts of puree with daughter/RN only following stringent oral care for comfort; Meds via alt means of nutrition    **Consider alternative means of nutrition versus more comfort-oriented level of care**  Risk management: Control risk factors for aspiration PNA by completing oral care 3-4x/day      NAME: Lauren Bryant  DOB: 1923-12-27  MRN: [id number]    Patient Diagnosis(es):   Patient Active Problem List    Diagnosis Date Noted    Atrial fibrillation with RVR (HCC) 10/09/2021    Elevated troponin 10/09/2021    GIB (gastrointestinal bleeding) 10/06/2021    Hyperlipidemia     Compression fracture of body of thoracic vertebra (HCC)     Frequent UTI     Pneumonia 03/20/2017     Allergies:   Allergies   Allergen Reactions    Macrobid [Nitrofurantoin] Rash     Subjective: RN cleared SLP for entry. Patient alert and upright in bed, daughter at bedside and active participant in session.    Pain: Does not report pain.    Current Diet: NPO Exceptions are: [employer], Sips of Water with Meds  Diet NPO Exceptions are: [employer], Sips of Water with Meds    Diet Tolerance:  N/A - Patient NPO    P.O. Trials:  Thin   X Single trials of ice chips: Poor labial seal around teaspoon and patient unable to extract ice chip from surface. Total anterior loss of ice chip on 1/2 trials. On second trial, patient with reduced to absent oral manipulation of ice chip, absent swallow noted. Suspect ice chip eventually melted with possible spillage & loss to pharynx.    Thin via cup edge: Anterior labial spillage due to reduced labial seal. Bolus holding with delayed initiation of swallow, suspected premature spillage. Continued with mild anterior spillage and/or poor secretion management on left side following trials. Reduced hyolaryngeal excursion via manual palpation.  Multiple swallows noted. Weak coughing noted on 2/4x trials.    Nectar X Mildly Thick (Nectar) via cup edge: Continued with times of bolus holding and delayed initiation of swallow. Delayed throat clear on 1/3x trials.   Puree X   Applesauce via teaspoon: Improved labial seal around spoon with puree solids compared to ice chip trials. Offered 1/2 teaspoon per trial. Prolonged manipulation of bolus with increased AP transit. Delayed initiation of swallow. Patient demonstrated effective oral clearance of bolus. Delayed throat clear on 1/5x trials.       Dysphagia Treatment and Impressions:  RN cleared SLP for entry.  Pt seen upright in bed with daughter at bedside.  Daughter had already completed oral care, states been doing 3x per day.  Noted severe xeroxtomia on lingual surface. Used moist toothette to moisten.  Patient demonstrated improved tolerance of trials this date compared to prior session.  No WOB or increased RR noted with PO today.  Educated family on risk of aspiration and recommendations for MBS to be completed in order to better assess pharyngeal phase.  Continue to recommend NPO, though allow small amounts of ice chips and puree solids for comfort & pleasure following strict oral care.  Daughter voiced understanding of aspiration risk with small amounts of puree for pleasure, though states comfort for patient is important.  Consider alternative means of nutrition versus more comfort-oriented level of care.    ST to continue follow.  Dysphagia Goals:  Short Term Goals:  Timeframe for Short Term Goals: (3x/ wk for 1 week until 10/15/21)    Goal 1: The patient will tolerate repeat BSE when able for ongoing assessment  10/10/2021 : Goal addressed, see above. Ongoing, progressing.        Long Term Goals:   Timeframe for Long Term Goals: (1-2 weeks)  Goal 1: The patient will tolerate least restrictive diet with no clinical s/s of aspiration or worsening respiratory/pulmonary status  10/10/2021 : NPO        Speech/Language/Cog Goals: n/a        Recommendations:  Diet recommendations: NPO; Ice chips or small amounts of puree with daughter/RN only following stringent oral care for comfort; Meds via alt means of nutrition    **Consider alternative means of nutrition versus more comfort-oriented level of care**  Risk management: Control risk factors for aspiration PNA by completing oral care 3-4x/day      Patient/Family/Caregiver Education: SLP educated the patient re: Role of SLP, rationale for completion of assessment, anatomical components of swallow structures as they pertain to airway protection, results of assessment, recommendations, and POC   Pt Education Response: no evidence of learning and would benefit from ongoing education    Compensatory Strategies: Control risk factors for aspiration PNA by completing oral care 3-4x/day and increasing physical mobility as is medically feasible    Plan:    Continued Dysphagia treatment with goals per plan of care.  Plan for MBS.    Discharge Recommendations: TBD    If pt discharges from hospital prior to Speech/Swallowing discharge, this note serves as tx and discharge summary.     Total Treatment Time / Charges     Time in Time out Total Time / units   Cognitive Tx  -- -- --   Speech Tx -- -- --   Dysphagia Tx 1425 1450 25 mins / 1 unit      Signature:  Reginia Naas M.A., [place], [place]  [id number]  Speech-Language Pathologist

## 2021-10-10 NOTE — Discharge Instructions (Addendum)
Continuity of Care Form    Patient Name: Lauren Bryant   DOB:  Oct 12, 1924  MRN:  [id number]    Admit date:  10/06/2021  Discharge date:  10/14/2021    Code Status Order: DNR-CC   Advance Directives:     Admitting Physician:  Thomes Cake, MD  PCP: [name], APRN - CNP    Discharging Nurse: [facility] Unit/Room#: 0204/0204-01  Discharging Unit Phone Number: [phone]    Emergency Contact:   Extended Emergency Contact Information  Primary Emergency Contact: Sirk,[facility])  Home Phone: [phone]  Mobile Phone: [phone]  Relation: Child  Secondary Emergency Contact: Crowley,Kristina  Home Phone: [phone]  Relation: Grandchild    Past Surgical History:  Past Surgical History:   Procedure Laterality Date    HYSTERECTOMY (CERVIX STATUS UNKNOWN)      TUBAL LIGATION         Immunization History:   Immunization History   Administered Date(s) Administered    COVID-19, PFIZER PURPLE top, DILUTE for use, (age 44 y+), 71mcg/0.3mL 11/03/2020    Influenza Virus Vaccine 10/03/2013, 10/01/2014, 10/08/2015, 10/06/2016, 11/09/2017    Influenza, FLUAD, (age 65 y+), Adjuvanted, 0.26mL 10/12/2020    Influenza, High Dose (Fluzone 65 yrs and older) 10/30/2018    PPD Test 09/21/2021    Tdap (Boostrix, Adacel) 08/24/2018       Active Problems:  Patient Active Problem List   Diagnosis Code    Pneumonia J18.9    Hyperlipidemia E78.5    Compression fracture of body of thoracic vertebra (HCC) S22.000A    Frequent UTI N39.0    GIB (gastrointestinal bleeding) K92.2    Atrial fibrillation with RVR (HCC) I48.91    Elevated troponin R77.8       Isolation/Infection:   Isolation            Contact          Patient Infection Status       Infection Onset Added Last Indicated Last Indicated By Review Planned Expiration Resolved Resolved By    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     Last documented pain score (0-10 scale):    Last Weight:   Wt Readings from Last 1 Encounters:   10/08/21 100 lb 15.5 oz (45.8 kg)     Mental Status:  disoriented    IV Access:  - None    Nursing Mobility/ADLs:  Walking   Dependent  Transfer  Dependent  Bathing  Dependent  Dressing  Dependent  Toileting  Dependent  Feeding  Assisted  Med Admin  Assisted  Med Delivery   crushed    Wound Care Documentation and Therapy:        Elimination:  Continence:   Bowel: No  Bladder: Yes  Urinary Catheter: Indication for Use of Catheter: Acute urinary retention/obstruction   Colostomy/Ileostomy/Ileal Conduit: No       Date of Last BM: 10/14/2021  Intake/Output Summary (Last 24 hours) at 10/10/2021 1056  Last data filed at 10/10/2021 0851  Gross per 24 hour   Intake 799.62 ml   Output 1200 ml   Net -400.38 ml     I/O last 3 completed shifts:  In: 799.6 [I.V.:799.6]  Out: 900 [Urine:900]    Safety Concerns:     At Risk for Falls and Aspiration Risk    Impairments/Disabilities:      Speech and Hearing    Nutrition Therapy:  Current Nutrition Therapy:   - Oral Diet:  Dysphagia 1 pureed    Routes of Feeding: Oral  Liquids: Nectar Thick Liquids  Daily Fluid Restriction: no  Last Modified Barium Swallow with Video (Video Swallowing Test): done on [id number]/     Treatments at the Time of Hospital Discharge:   Respiratory Treatments:   Oxygen Therapy:  is not on home oxygen therapy.  Ventilator:    - No ventilator support    Rehab Therapies: Physical Therapy, Occupational Therapy, and Speech/Language Therapy  Weight Bearing Status/Restrictions: No weight bearing restrictions  Other Medical Equipment (for information only, NOT a DME order):  hospital bed  Other Treatments:     Patient's  personal belongings (please select all that are sent with patient):  Glasses, Hearing Aides bilateral, Dentures upper    RN SIGNATURE:  Electronically signed by Hazle Coca, RN on 10/14/21 at 11:11 AM EDT    CASE MANAGEMENT/SOCIAL WORK SECTION    Inpatient Status Date: ***    Readmission Risk Assessment Score:  Readmission Risk              Risk of Unplanned Readmission:  15           Discharging to Facility/ Agency   Name: [facility]  Address:  Phone: [phone]  Fax:    Dialysis Facility (if applicable)   Name:  Address:  Dialysis Schedule:  Phone:  Fax:    Case Manager/Social Worker signature: Electronically signed by Manley Mason, RN on 10/14/21 at 10:34 AM EDT    PHYSICIAN SECTION    Prognosis: Fair    Condition at Discharge: Stable    Rehab Potential (if transferring to Rehab): n/a    Recommended Labs or Other Treatments After Discharge:   Recommended Follow-up, Labs or Other Treatments After Discharge:    PCP for medication reconciliation, palliative care             Physician Certification: I certify the above information and transfer of Lauren Bryant  is necessary for the continuing treatment of the diagnosis listed and that she requires Skilled Nursing Facility for less 30 days.     Update Admission H&P: No change in H&P    PHYSICIAN SIGNATURE:  Electronically signed by Autumn Patty, DO on 10/14/21 at 10:18 AM EDT

## 2021-10-10 NOTE — Progress Notes (Signed)
Foley removed. Pt instructed need to void within 6 hours, but pt likely doesn't comprehend d/t cognitive level.  Family at bedside and aware. Will continue to monitor.

## 2021-10-10 NOTE — Care Coordination-Inpatient (Addendum)
Therapy rec's of SNF noted.  Discussed rec's with patient's son and daughter and they are in agreement with this plan and would like a referral to be made to [facility].  Placed call to [facility] with [facility] and message left with referral.  Patient will  not require a precert.       1352 addendum: Received call from Heather with [facility] and she states they are able to accept at discharge.

## 2021-10-10 NOTE — Progress Notes (Addendum)
Gastroenterology Progress Note    Patient:   Lauren Bryant   Date of Birth:  Apr 06, 1924   Facility:   [facility]   Referring/PCP: [name], APRN - CNP  Date:     10/10/2021  Consultant:   Megan Salon, MD, MD    Subjective:     This 87 y.o. female was admitted 10/06/2021 with a diagnosis of "Acute blood loss anemia [D62]  GIB (gastrointestinal bleeding) [K92.2]  Elevated troponin [R77.8]  Altered mental status, unspecified altered mental status type [R41.82]  Gastrointestinal hemorrhage, unspecified gastrointestinal hemorrhage type [K92.2]  Atrial flutter, unspecified type (HCC) [I48.92]" and is seen in consultation regarding "melena". Information was obtained from interview of  the patient and the patient's daughter, examination of the patient, and review of records. I did  update the past medical, surgical, social and / or family history.    Melena for days mild in GI tract assoc w anemia    Current status  Present  Diet Order: Diet NPO Exceptions are: Ice Chips, Sips of Water with Meds and she is tolerating diet.  Recently, she has experienced no abdominal  Pain and she has not required Intravenous narcotic analgesics.  The patient has also experienced no constipation, diarrhea, hematochezia, nausea, and vomiting      Prior to Admission medications    Medication Sig Start Date End Date Taking? Authorizing Provider   ALPRAZolam (XANAX) 0.25 MG tablet Take 0.25 mg by mouth 3 times daily as needed for Anxiety or Sleep.    Historical Provider, MD   trimethoprim (TRIMPEX) 100 MG tablet Take 1 tablet by mouth daily 05/03/21   [name], APRN - CNP   simvastatin (ZOCOR) 20 MG tablet Take 1 tablet by mouth nightly 05/02/21   [name], APRN - CNP   venlafaxine (EFFEXOR XR) 37.5 MG extended release capsule TAKE 1 CAPSULE TWICE A DAY 04/06/21   Taya Jeraldine Loots, APRN - CNP   docusate sodium (COLACE) 100 MG capsule Take 100 mg by mouth 2 times daily    Historical Provider, MD    ferrous sulfate 325 (65 Fe) MG tablet Take 325 mg by mouth daily (with breakfast)    Historical Provider, MD   aspirin 81 MG tablet Take 81 mg by mouth daily    Historical Provider, MD      Scheduled Medications:    QUEtiapine  12.5 mg Oral Daily    pantoprazole (PROTONIX) 40 mg injection  40 mg IntraVENous Daily    venlafaxine  37.5 mg Oral BID    sodium chloride flush  5-40 mL IntraVENous 2 times per day    enoxaparin  30 mg SubCUTAneous Daily    sodium chloride flush  5-40 mL IntraVENous 2 times per day     Infusions:    sodium chloride      sodium chloride      sodium chloride      sodium chloride       PRN Medications: perflutren lipid microspheres, sodium chloride, haloperidol lactate, sodium chloride, sodium chloride flush, sodium chloride, ondansetron **OR** ondansetron, sodium chloride flush, sodium chloride, polyethylene glycol, acetaminophen **OR** acetaminophen  Allergies:   Allergies   Allergen Reactions    Macrobid [Nitrofurantoin] Rash       Past Medical History:   Diagnosis Date    Compression fracture of body of thoracic vertebra (HCC)     Frequent UTI     Hyperlipidemia     TIA 2015  Past Surgical History:   Procedure Laterality Date    HYSTERECTOMY (CERVIX STATUS UNKNOWN)      TUBAL LIGATION         Social:   Social History     Tobacco Use    Smoking status: Former     Packs/day: 0.50     Types: Cigarettes     Quit date: 03/01/2020     Years since quitting: 1.6    Smokeless tobacco: Never   Substance Use Topics    Alcohol use: No     Family: History reviewed. No pertinent family history.    ROS: Pertinent items are noted in HPI.    Objective:   Vital Signs:  Temp (24hrs), Avg:98.2 ??F (36.8 ??C), Min:97.7 ??F (36.5 ??C), Max:98.8 ??F (37.1 ??C)     Systolic (24hrs), Avg:93 , Min:76 , Max:119      Diastolic (24hrs), Avg:56, Min:40, Max:73     Pulse  Avg: 125.2  Min: 71  Max: 174  BP (!) 98/40    Pulse 76    Temp 98.1 ??F (36.7 ??C)    Resp 16    Wt 225 lb 14.4 oz (102.5 kg)    LMP  (LMP Unknown)    SpO2  100%    BMI 37.59 kg/m??      Physical Exam:   BP (!) 98/40    Pulse 76    Temp 98.1 ??F (36.7 ??C)    Resp 16    Wt 225 lb 14.4 oz (102.5 kg)    LMP  (LMP Unknown)    SpO2 100%    BMI 37.59 kg/m??   General appearance: Alert but confused, NAD  Heart: regular rate and rhythm, S1, S2 normal, no murmur, click, rub or gallop  Abdomen: soft, non-tender; bowel sounds normal; no masses,  no organomegaly      Lab and Imaging Review   Recent Labs     10/07/21  0618 10/07/21  1745 10/08/21  0623 10/08/21  2015 10/09/21  0822   WBC 11.1*  --  14.4* 10.0 10.6   HGB 7.3* 9.4* 9.4* 9.7* 10.0*   MCV 92.4  --  92.9 91.2 92.1   PLT 223  --  248 225 244   NA 140  --  138 137 137   K 4.2  --  3.7 3.8 3.5   CL 103  --  98* 99 100   CO2 24  --  19* 19* 18*   BUN 18  --  15 18 19    CREATININE 0.6  --  0.6 0.7 0.7   GLUCOSE 103*  --  85 84 84   CALCIUM 8.7  --  8.8 8.6 8.5   MG  --   --   --   --  2.00         Assessment:     Patient Active Problem List    Diagnosis Date Noted    Atrial fibrillation with RVR (HCC) 10/09/2021    Elevated troponin 10/09/2021    GIB (gastrointestinal bleeding) 10/06/2021    Hyperlipidemia     Compression fracture of body of thoracic vertebra (HCC)     Frequent UTI     Pneumonia 03/20/2017     87 yo w dementia and Fe def anemia w black stools admitted w acute encephalopathy, improving, and new onset afib w RVR, but found to have anemia and heme-pos stools. H/H 6/19, transfused.    Plan:   Supportive care  Was  started on IV Dig.  Diet today per primary team  Plan for EGD in am, prior to D/C as discussed w daughter  No plans for colonoscopy (family refuse)  Will follow     Megan Salon, MD       (O) [phone]

## 2021-10-10 NOTE — Progress Notes (Signed)
Pt's daughter provided DNR and advanced directives. Copies placed in paper chart.

## 2021-10-10 NOTE — Plan of Care (Signed)
Problem: Pain  Goal: Verbalizes/displays adequate comfort level or baseline comfort level  10/10/2021 1126 by Fredricka Bonine, RN  Outcome: Progressing  Note: Pt will be satisfied with pain control. RN uses Wong-Baker pain rating scale with reassessments after pain med administration. Will continue to monitor progression throughout shift.      Problem: Safety - Adult  Goal: Free from fall injury  10/10/2021 1126 by Fredricka Bonine, RN  Outcome: Progressing  Note: Pt high fall risk. Call light within reach. Bed in low position. Bed alarm on.  Will continue to monitor.

## 2021-10-10 NOTE — Progress Notes (Signed)
Patient now showing sinus rhythm on the monitor. Heart rate 72.

## 2021-10-10 NOTE — Progress Notes (Signed)
[facility]   Daily Progress Note    Admit Date:  10/06/2021  HPI:    Chief Complaint   Patient presents with    Altered Mental Status     Pt from home, family could not wake her up, family told medics " she wasn't acting normal"        Interval history: Lauren Bryant is being followed for elevated troponin in setting of severe anemia from GI bleed.     Subjective:  Lauren Bryant is having difficulty communicating due to very dry mouth and hard of hearing.   Daughter at the bedside and able to communicate with patient. Patient reports being hungry and wanting to get up.     Objective:   BP 123/75    Pulse 75    Temp 97.9 ??F (36.6 ??C) (Axillary)    Resp 19    Wt 100 lb 15.5 oz (45.8 kg)    LMP  (LMP Unknown)    SpO2 99%    BMI 16.80 kg/m??     Intake/Output Summary (Last 24 hours) at 10/10/2021 1229  Last data filed at 10/10/2021 0851  Gross per 24 hour   Intake 799.62 ml   Output 1200 ml   Net -400.38 ml       Physical Exam:  General:  Awake, alert, NAD, tongue very dry. HOH   Skin:  Warm and dry  Neck:  JVD<8  Chest:  dim  to auscultation, no wheezes/rhonchi/rales  Telemetry: afib with controlled rate   Cardiovascular:  irregular  S1S2, + murmur/no r/g   Abdomen:  Soft, nontender, +bowel sounds  Extremities:  no  bilateral lower extremity edema    Medications:    pantoprazole (PROTONIX) 40 mg injection  40 mg IntraVENous Q12H    digoxin  125 mcg IntraVENous Daily    QUEtiapine  12.5 mg Oral Daily    venlafaxine  37.5 mg Oral BID    enoxaparin  30 mg SubCUTAneous Daily    sodium chloride flush  5-40 mL IntraVENous 2 times per day      dextrose 5% in lactated ringers 75 mL/hr at 10/10/21 0934    sodium chloride      sodium chloride      sodium chloride         Lab Data:  CBC:   Recent Labs     10/08/21  2015 10/09/21  0822 10/10/21  0833   WBC 10.0 10.6 10.5   HGB 9.7* 10.0* 10.1*   PLT 225 244 242     BMP:    Recent Labs     10/08/21  2015 10/09/21  0822 10/10/21  0833   NA 137 137 137   K 3.8 3.5 4.2   CO2  19* 18* 22   BUN 18 19 16    CREATININE 0.7 0.7 0.6     INR:  No results for input(s): INR in the last 72 hours.  BNP:  No results for input(s): PROBNP in the last 72 hours.  No results found for: LVEF, LVEFMODE    Testing:      Principal Problem:    GIB (gastrointestinal bleeding)  Active Problems:    Atrial fibrillation with RVR (HCC)    Elevated troponin  Resolved Problems:    * No resolved hospital problems. *      Assessment:  Elevated troponin in setting of severe anemia due to Acute GI bleed   New onset Afib rvr- improving  GI Bleed  Mild Aortic stenosis  Severe mitral regurg       Plan:  Daily  IV digoxin while NPO then change to oral dose once able to take in PO. Not a great long term option for HR control given advance age, however not able to use CCB or BB due to lower blood pressures.     DNR-CC  No further cardiology recommendations or invasive testing.     Will sign off, please call with questions.       Anda Latina, APRN - CNP,  10/10/2021, 1:19 PM

## 2021-10-10 NOTE — Progress Notes (Signed)
Call placed to [facility] for clarification regarding EGD time.  Awaiting return call @ this time.

## 2021-10-10 NOTE — Progress Notes (Signed)
Progress Note      PCP: [name], APRN - CNP    Date of Admission: 10/06/2021    Chief Complaint: dizziness, altered mental status    Hospital Course: Lauren Bryant is a 87 year old female with past medical history of frequent UTIs, improved on outpatient trimethoprim, and hyperlipidemia who presented 10/13 with altered mental status.  Patient alerted to her medical bracelet that she was feeling dizzy at [employer] where she lives.  Her caretaker noted that when she woke up in the morning she was not acting like herself, was not talking or eating much.  When EMS arrived she noted her dizziness improved.  Per chart review family stated in the ED that patient has history of chronic dizziness and also endorsed patient was irritable with decreased p.o. intake for 2 days prior to admission.  They also reported patient experiences chronic dark stools.    In the ED Hemoccult was positive for melanotic stool, with troponin slightly elevated at 0.03.  H&H was 6.0/19.3 .  1 unit of PRBCs was transfused.  ECG revealed atrial flutter. Patient was admitted for further workup with CT head negative for acute process and chest x-ray unremarkable.  Patient was started on Protonix drip.  Additional 1 unit of PRBC was transfused 10/14, with H&H uptrending.      Cardiology was consulted.  A total of 3 doses of IV digoxin were administered (total 1 mg), with cardiology not recommending echo due to age and comorbidities making patient not a candidate for valvular intervention. Repeat troponin increased to 0.05 on 10/16.    Subjective: Patient seen today at the bedside, hard of hearing with difficulty communicating due to dry mouth and altered mentation. Daughter Kendal Hymen at the bedside and able to provide history and communicate with patient. Patient is aware and alert and does visually track conversation while at the bedside. Patient has foley cath due to increased retention, will do voiding trial.    Kendal Hymen  (daughter) cell: [phone]    Medications:  Reviewed    Infusion Medications    dextrose 5% in lactated ringers 75 mL/hr at 10/10/21 0934    sodium chloride      sodium chloride      sodium chloride       Scheduled Medications    pantoprazole (PROTONIX) 40 mg injection  40 mg IntraVENous Q12H    digoxin  125 mcg IntraVENous Daily    QUEtiapine  12.5 mg Oral Daily    venlafaxine  37.5 mg Oral BID    enoxaparin  30 mg SubCUTAneous Daily    sodium chloride flush  5-40 mL IntraVENous 2 times per day     PRN Meds: sodium chloride, haloperidol lactate, sodium chloride, sodium chloride, ondansetron **OR** ondansetron, sodium chloride flush, polyethylene glycol, acetaminophen **OR** acetaminophen      Intake/Output Summary (Last 24 hours) at 10/10/2021 1457  Last data filed at 10/10/2021 0851  Gross per 24 hour   Intake 0 ml   Output 1000 ml   Net -1000 ml       Physical Exam Performed:    BP 123/75    Pulse 75    Temp 97.9 ??F (36.6 ??C) (Axillary)    Resp 19    Wt 100 lb 15.5 oz (45.8 kg)    LMP  (LMP Unknown)    SpO2 99%    BMI 16.80 kg/m??     General appearance: no acute distress, frail, appears stated age and somnolent.  HEENT: Pupils equal, round. Conjunctivae/corneas clear.  Neck: Supple, with full range of motion. No jugular venous distention.  Respiratory:  Normal respiratory effort. Clear to auscultation, bilaterally without Rales/Wheezes/Rhonchi.  Cardiovascular: Regular rate and rhythm without murmurs, rubs or gallops.  Abdomen: Soft, non-tender, non-distended.  Musculoskeletal: No clubbing, cyanosis or edema bilaterally.  .  Skin: Skin color, texture, turgor normal.  No rashes or lesions.  Neurologic:  Neurovascularly intact without any focal sensory deficits  Psychiatric: Alert and oriented, thought content appropriate, normal insight  Capillary Refill: Brisk,3 seconds, normal   Peripheral Pulses: +2 palpable, equal bilaterally       Labs:   Recent Labs     10/08/21  2015 10/09/21  0822 10/10/21  0833   WBC 10.0  10.6 10.5   HGB 9.7* 10.0* 10.1*   HCT 30.5* 31.7* 32.3*   PLT 225 244 242     Recent Labs     10/08/21  2015 10/09/21  0822 10/10/21  0833   NA 137 137 137   K 3.8 3.5 4.2   CL 99 100 102   CO2 19* 18* 22   BUN 18 19 16    CREATININE 0.7 0.7 0.6   CALCIUM 8.6 8.5 8.9     Recent Labs     10/10/21  0833   AST 34   ALT 19   BILITOT 0.6   ALKPHOS 51     No results for input(s): INR in the last 72 hours.  Recent Labs     10/09/21  0822 10/09/21  1142   TROPONINI 0.06* 0.05*       Urinalysis:      Lab Results   Component Value Date/Time    NITRU Negative 10/07/2021 09:50 AM    WBCUA 87 09/24/2021 03:21 AM    BACTERIA 4+ 09/24/2021 03:21 AM    RBCUA 1 09/24/2021 03:21 AM    BLOODU Negative 10/07/2021 09:50 AM    SPECGRAV 1.020 10/07/2021 09:50 AM    GLUCOSEU Negative 10/07/2021 09:50 AM       Radiology:  CT HEAD WO CONTRAST   Final Result   No acute intracranial abnormality.  There is no change from prior examination.         XR CHEST PORTABLE   Final Result   1.  No acute abnormality.                 Assessment/Plan:    Active Hospital Problems    Diagnosis     Atrial fibrillation with RVR (HCC) [I48.91]      Priority: Medium    Elevated troponin [R77.8]      Priority: Medium    GIB (gastrointestinal bleeding) [K92.2]      Priority: Medium     Acute Blood Loss Anemia  - likely secondary to GI bleed  - positive FOBT on 10/13  - continue to trend H&H (10.1/32.3 today)  - Pt has received 2 units PRBC thus far  - GI consulted:   - plan for EGD 10/18    2. New onset A. Fib RVR  - continue telemetry monitoring  - EKG today 10/17 showing sinus rhythm with PACs and nonspecific T wave abnormalities  - patient received 3 doses IV digoxin (1 mg total)  - start IV digoxin 125 mcg daily  - aspirin 81 mg held  - cardiology consulted, appreciate input:   - if IV digoxin ineffective for HR control consider IV amio   - Echo cancelled, pt  not candidate for valve intervention  - elevated troponin likely due to demand ischemia from  tachycardia, no workup indicated.    3. Acute encephalopathy  - UA negative on 10/14  - Procalcitonin negative on 10/15  - CT head wo contrast negative for acute process on 10/13  - patient cannot have MRI due to metal in her ear    4. Dysphagia  - Speech following for dysphagia, appreciate recs:   - pt is NPO except ice chips and small amounts of puree   - ordered modified barium swallow    5. HLD  - simvastatin 20 mg held    6. Chronic UTI  - trimethoprim 100 mg held  - pt has foley due to urinary retention  - void trial today       DVT Prophylaxis: sequential compression device  Diet: Diet NPO Exceptions are: Ice Chips, Sips of Water with Meds  Diet NPO Exceptions are: [employer], Sips of Water with Meds  Code Status: DNR-CC    PT/OT Eval Status: recommended SNF at DC, AMPAC score of 7    Dispo - pending EGD and medical improvement    Bransen Fassnacht DO, PGY-1   [facility] [employer] [facility] Res[facility]

## 2021-10-11 ENCOUNTER — Inpatient Hospital Stay: Admit: 2021-10-11 | Payer: MEDICARE | Primary: Registered Nurse

## 2021-10-11 LAB — COMPREHENSIVE METABOLIC PANEL
ALT: 20 U/L (ref 10–40)
AST: 32 U/L (ref 15–37)
Albumin/Globulin Ratio: 1.8 (ref 1.1–2.2)
Albumin: 3.4 g/dL (ref 3.4–5.0)
Alkaline Phosphatase: 48 U/L (ref 40–129)
Anion Gap: 16 (ref 3–16)
BUN: 8 mg/dL (ref 7–20)
CO2: 22 mmol/L (ref 21–32)
Calcium: 8.7 mg/dL (ref 8.3–10.6)
Chloride: 101 mmol/L (ref 99–110)
Creatinine: 0.6 mg/dL (ref 0.6–1.2)
Est, Glom Filt Rate: >60 (ref >60)
Glucose: 115 mg/dL — ABNORMAL HIGH (ref 70–99)
Potassium: 3.6 mmol/L (ref 3.5–5.1)
Sodium: 139 mmol/L (ref 136–145)
Total Bilirubin: 0.6 mg/dL (ref 0.0–1.0)
Total Protein: 5.3 g/dL — ABNORMAL LOW (ref 6.4–8.2)

## 2021-10-11 LAB — COVID-19, RAPID: SARS-CoV-2, NAAT: NOT DETECTED

## 2021-10-11 LAB — POCT GLUCOSE
POC Glucose: 101 mg/dl — ABNORMAL HIGH (ref 70–99)
POC Glucose: 103 mg/dl — ABNORMAL HIGH (ref 70–99)
POC Glucose: 108 mg/dl — ABNORMAL HIGH (ref 70–99)
POC Glucose: 121 mg/dl — ABNORMAL HIGH (ref 70–99)
POC Glucose: 126 mg/dl — ABNORMAL HIGH (ref 70–99)

## 2021-10-11 LAB — CBC WITH AUTO DIFFERENTIAL
Basophils %: 1.2 %
Basophils Absolute: 0.1 10*3/uL (ref 0.0–0.2)
Eosinophils %: 3.5 %
Eosinophils Absolute: 0.3 10*3/uL (ref 0.0–0.6)
Hematocrit: 32.1 % — ABNORMAL LOW (ref 36.0–48.0)
Hemoglobin: 9.9 g/dL — ABNORMAL LOW (ref 12.0–16.0)
Lymphocytes %: 5.1 %
Lymphocytes Absolute: 0.4 10*3/uL — ABNORMAL LOW (ref 1.0–5.1)
MCH: 28.5 pg (ref 26.0–34.0)
MCHC: 30.8 g/dL — ABNORMAL LOW (ref 31.0–36.0)
MCV: 92.7 fL (ref 80.0–100.0)
MPV: 11.2 fL — ABNORMAL HIGH (ref 5.0–10.5)
Monocytes %: 9.9 %
Monocytes Absolute: 0.8 10*3/uL (ref 0.0–1.3)
Neutrophils %: 80.3 %
Neutrophils Absolute: 6.7 10*3/uL (ref 1.7–7.7)
Platelets: 220 10*3/uL (ref 135–450)
RBC: 3.46 M/uL — ABNORMAL LOW (ref 4.00–5.20)
RDW: 15.2 % (ref 12.4–15.4)
WBC: 8.4 10*3/uL (ref 4.0–11.0)

## 2021-10-11 LAB — MAGNESIUM: Magnesium: 1.8 mg/dL (ref 1.80–2.40)

## 2021-10-11 MED FILL — PROTONIX 40 MG IV SOLR: 40 MG | INTRAVENOUS | Qty: 40

## 2021-10-11 MED FILL — DEXTROSE IN LACTATED RINGERS 5 % IV SOLN: 5 % | INTRAVENOUS | Qty: 1000

## 2021-10-11 MED FILL — DIGOXIN 0.25 MG/ML IJ SOLN: 0.25 MG/ML | INTRAMUSCULAR | Qty: 2

## 2021-10-11 MED FILL — LOVENOX 30 MG/0.3ML IJ SOSY: 30 MG/0.3ML | INTRAMUSCULAR | Qty: 0.3

## 2021-10-11 MED FILL — HALOPERIDOL LACTATE 5 MG/ML IJ SOLN: 5 MG/ML | INTRAMUSCULAR | Qty: 1

## 2021-10-11 NOTE — Progress Notes (Signed)
Occupational Therapy  Facility/Department: [facility]  Daily Treatment Note  NAME: Lauren Bryant  DOB: 1924/10/06  MRN: [id number]    Date of Service: 10/11/2021    Discharge Recommendations:  Subacute/Skilled Nursing Facility     Patient Diagnosis(es): The primary encounter diagnosis was Acute blood loss anemia. Diagnoses of Gastrointestinal hemorrhage, unspecified gastrointestinal hemorrhage type, Altered mental status, unspecified altered mental status type, Atrial flutter, unspecified type (HCC), and Elevated troponin were also pertinent to this visit.     Assessment    Assessment: Pt pleasant and agreeable. Pt demo improved mobility this day and req Min A x2 for bed mobility, Max A x2 for inital t/fs and Mod A x2 for 1st forward/backward walking and Min A x2 for second walk. Pt req Max A for grooming. cont per POC  Activity Tolerance: Patient limited by fatigue;Patient limited by endurance;Treatment limited secondary to decreased cognition  Discharge Recommendations: Subacute/Skilled Nursing Facility      Plan   Occupational Therapy Plan  Times Per Week: 3-5x per week  Current Treatment Recommendations: Strengthening;Balance training;Functional mobility training;Endurance training;Patient/Caregiver education & training;Self-Care / ADL;Safety education & training;Positioning;Cognitive reorientation     Restrictions  Restrictions/Precautions  Restrictions/Precautions: NPO;Fall Risk;Contact Precautions;Isolation  Position Activity Restriction  Other position/activity restrictions: MDRO, hx of dementia, aphasia    Subjective   Subjective  Subjective: Pt pleasant and agreeable however oriented to self only. Pts RN approved therapy. Family present for education. Pt denies pain. BP: 130/72 HR: 81, O2: 99%  Orientation  Overall Orientation Status: Impaired  Orientation Level: Oriented to person;Disoriented to time;Disoriented to situation  Pain: Denies  Cognition  Overall Cognitive Status:  Exceptions  Arousal/Alertness: Delayed responses to stimuli  Following Commands: Follows one step commands with increased time;Follows one step commands with repetition  Attention Span: Attends with cues to redirect  Memory: Decreased short term memory  Safety Judgement: Decreased awareness of need for assistance;Decreased awareness of need for safety  Problem Solving: Decreased awareness of errors  Insights: Not aware of deficits  Initiation: Requires cues for all  Sequencing: Requires cues for all  Cognition Comment: patient's daughter said that she has short term memory deficits at baseline.        Objective      Bed Mobility Training  Bed Mobility Training: Yes  Overall Level of Assistance: Moderate assistance;Assist X2  Supine to Sit: Moderate assistance;Assist X2  Sit to Supine: Other (comment) (Pt in chair at end of session)  Scooting: Moderate assistance  Balance  Sitting: Impaired  Sitting - Static: Fair (occasional)  Sitting - Dynamic: Fair (occasional)  Standing: Impaired  Standing - Static: Poor;Constant support  Standing - Dynamic: Poor;Constant support  Art therapist: Yes  Overall Level of Assistance: Moderate assistance;Assist X2  Interventions: Safety awareness training;Tactile cues;Verbal cues;Demonstration  Sit to Stand: Moderate assistance;Assist X2 (from EOB and recliner)  Stand to Sit: Moderate assistance;Assist X2 (VC for hand placement)  Bed to Chair: Assist X2;Maximum assistance (with RW)       ADL  Grooming: Dependent/Total  Grooming Skilled Clinical Factors: to wash hair and comb hair seated in recliner  LE Dressing: Dependent/Total  LE Dressing Skilled Clinical Factors: don socks  Toileting: Dependent/Total  Toileting Skilled Clinical Factors: purewick,        Safety Devices  Type of Devices: All fall risk precautions in place;Call light within reach;Chair alarm in place;Gait belt;Patient at risk for falls;Left in chair;Nurse notified     Patient Education  Education  Given To: Family;Patient  Education Provided: Role of Therapy;Plan of Care;Transfer Training;Equipment;Fall Prevention Strategies;Precautions;Orientation;Family Education  Education Provided Comments: family education: role of OT, role of therapy, transfer training, safety with transfers, orthostatic BP, safety during hospital stay.  Education Method: Verbal  Barriers to Learning: Cognition  Education Outcome: Verbalized understanding;Demonstrated understanding;Continued education needed    Goals  Short Term Goals  Time Frame for Short Term Goals: 1 week unless otherwise specified by 10/16/21-- Ongoing as of 10/18  Short Term Goal 1: Pt will complete 1 grooming task with min A sitting or bed level  Short Term Goal 2: Pt will complete stand pivot to [facility] with min A x1  Short Term Goal 3: Pt will complete LBD with mod A  Short Term Goal 4: Pt will complete UB bathing with min A (trunk/BUE only)  Patient Goals   Patient goals : did not verbalize       Therapy Time   Individual Concurrent Group Co-treatment   Time In 1337         Time Out 1403         Minutes 26         Timed Code Treatment Minutes: 26 Minutes       Kensley Lares, OT  If pt is unable to be seen after this session, please let this note serve as discharge summary.  Please see case management note for discharge disposition.  Thank you.

## 2021-10-11 NOTE — Progress Notes (Signed)
Physical Therapy  Facility/Department: [facility]  Daily Treatment Note  NAME: Lauren Bryant  DOB: Jun 01, 1924  MRN: [id number]    Date of Service: 10/11/2021    Discharge Recommendations:  Subacute/Skilled Nursing Facility   PT Equipment Recommendations  Equipment Needed: No  Other: defer to patient's facility    Patient Diagnosis(es): The primary encounter diagnosis was Acute blood loss anemia. Diagnoses of Gastrointestinal hemorrhage, unspecified gastrointestinal hemorrhage type, Altered mental status, unspecified altered mental status type, Atrial flutter, unspecified type (HCC), and Elevated troponin were also pertinent to this visit.    Assessment   Assessment: Pt tolerates PT tx well this date. Pt seen as cotx with OT to progress functional mobility safely and maximize independence. Pt progressing from max(A)x2 for SPT to min(A)x2 for ambulating forward/backward short distance. Pt would benefit from continued skilled PT to address current deficits. Recommend SNF upon d/c.  Activity Tolerance: Patient limited by fatigue;Patient limited by endurance;Treatment limited secondary to decreased cognition  Equipment Needed: No  Other: defer to patient's facility     Plan    Physcial Therapy Plan  General Plan: 3-5 times per week  Specific Instructions for Next Treatment: progress mobility as tolerated  Current Treatment Recommendations: Strengthening;Balance training;Functional mobility training;Transfer training;Endurance training;Gait training;Patient/Caregiver education & training;Safety education & training;Equipment evaluation, education, & procurement;Home exercise program;Therapeutic activities     Restrictions  Restrictions/Precautions  Restrictions/Precautions: NPO, Fall Risk, Contact Precautions, Isolation  Position Activity Restriction  Other position/activity restrictions: MDRO, hx of dementia, aphasia     Subjective    Subjective  Subjective: Pt supine in bed upon arrival, family at bedside,  agreeable to PT tx. Pt HOH, hears better in L ear  Pain: Denies     Objective   Vitals  BP 130/72  HR 82 bpm  SpO2 99% on RA     Bed Mobility Training  Bed Mobility Training: Yes  Overall Level of Assistance: Moderate assistance;Assist X2  Supine to Sit: Moderate assistance;Assist X2  Sit to Supine: Other (comment) (Pt in chair at end of session)  Scooting: Moderate assistance  Balance  Sitting: Impaired  Sitting - Static: Fair (occasional)  Sitting - Dynamic: Fair (occasional)  Standing: Impaired  Standing - Static: Poor;Constant support  Standing - Dynamic: Poor;Constant support  Art therapist: Yes  Overall Level of Assistance: Moderate assistance;Assist X2  Interventions: Safety awareness training;Tactile cues;Verbal cues;Demonstration  Sit to Stand: Moderate assistance;Assist X2 (from EOB and recliner)  Stand to Sit: Moderate assistance;Assist X2 (VC for hand placement)  Bed to Chair: Assist X2;Maximum assistance (with RW)  Gait Training  Gait Training: Yes  Gait  Overall Level of Assistance: Moderate assistance;Assist X2 (progessing to minx2)  Interventions: Demonstration;Safety awareness training;Tactile cues;Verbal cues;Visual cues  Base of Support: Center of gravity altered;Narrowed  Speed/Cadence: Pace decreased (< 100 feet/min);Shuffled;Slow  Step Length: Right shortened;Left shortened  Gait Abnormalities: Decreased step clearance;Trunk sway increased;Path deviations  Distance (ft): 5 Feet (1ft fwd, 5 ft bckwrd x2 bouts)  Assistive Device: Walker, rolling;Gait belt           Safety Devices  Type of Devices: All fall risk precautions in place;Call light within reach;Chair alarm in place;Gait belt;Patient at risk for falls;Left in chair;Nurse notified       Goals  Short Term Goals  Time Frame for Short Term Goals: 1 week 10/16/21--10/18 All goals continue  Short Term Goal 1: Supine <> sit with minimum assistance.  Short Term Goal 2: Sit <> stand with moderate  assistance.  Short Term  Goal 3: Bed <> chair with least restrictive assistive device and moderate assist  Short Term Goal 4: Ambulate 50 feet with least restrictive assistive device and moderate assist  Short Term Goal 5: By 10/12/21 Patient will tolerate 12-15 reps of her exercises to maximize her strength/endurance  Patient Goals   Patient Goals : To sit in the chair. Patient's past medical history includes dementia. Patient's daughter requested that patient receive additional therapy at a SNF upon discharge.    Education  Patient Education  Education Given To: Patient;Family  Education Provided: Role of Therapy;Plan of Care;Home Exercise Program;Fall Prevention Strategies;Transfer Training;Equipment  Education Method: Demonstration;Verbal  Barriers to Learning: Cognition;Hearing  Education Outcome: Verbalized understanding;Continued education needed    Therapy Time   Individual Concurrent Group Co-treatment   Time In 1337         Time Out 1402         Minutes 25         Timed Code Treatment Minutes: 25 Minutes     If pt is unable to be seen after this session, please let this note serve as discharge summary.  Please see case management note for discharge disposition.  Thank you.    Kathlen Brunswick, PT

## 2021-10-11 NOTE — Progress Notes (Signed)
Comprehensive Nutrition Assessment    Type and Reason for Visit:  Initial, NPO/Clear Liquid    Nutrition Recommendations/Plan:   Monitor SLP recommendations following MBS today   Due to NPO status, discussion of palliative care vs nutrition support should be considered. Consult dietitian if tube feeding is desired and consistent with patient's plan of care.  Monitor nutrition adequacy, pertinent labs, bowel habits, wt changes, and clinical progress     Malnutrition Assessment:  Malnutrition Status:  At risk for malnutrition (Comment) (pt unavailable at time of visit this date, likely has malnutrition) (10/11/21 1314)    Context:  Acute Illness     Findings of the 6 clinical characteristics of malnutrition:  Energy Intake:  50% or less of estimated energy requirements for 5 or more days    Nutrition Assessment:    87 yo female admitted with GIB and AMS. Pt nutritionally compromised AEB NPO x5 days. Plans for EGD today. SLP recommending NPO status, MBS ordered. Pt unavailable this date, at [facility]. Discussed with RN, MBS today. Unclear if GOC and artificial nutrition has been considered. Pt likley with malnutriton, UTA d/t no weight hx and RD unable to see pt today. If unable to advance PO diet, monitor GOC vs. wishes regarding artificial nutrition.    Nutrition Related Findings:    Denttures. Labs reviewed. Wound Type: None       Current Nutrition Intake & Therapies:    Average Meal Intake: NPO  Average Supplements Intake: NPO  Diet NPO Exceptions are: Ice Chips, Sips of Water with Meds    Anthropometric Measures:  Height: 5\' 5"  (165.1 cm)  Ideal Body Weight (IBW): 125 lbs (57 kg)       Current Body Weight: 100 lb (45.4 kg), 80 % IBW. Weight Source: Bed Scale  Current BMI (kg/m2): 16.6                          BMI Categories: Underweight (BMI less than 22) age over 28    Estimated Daily Nutrient Needs:  Energy Requirements Based On: Kcal/kg (25-30)  Weight Used for Energy Requirements: Ideal  Energy (kcal/day): 1425-1710  kcal  Weight Used for Protein Requirements: Ideal (1.0-1.2 g/kg)  Protein (g/day): 57-69 g  Method Used for Fluid Requirements: 1 ml/kcal  Fluid (ml/day): 1425-1710 mL    Nutrition Diagnosis:   Inadequate oral intake related to inadequate protein-energy intake, swallowing difficulty as evidenced by NPO or clear liquid status due to medical condition, swallow study results, BMI    Nutrition Interventions:   Food and/or Nutrient Delivery: Continue NPO (diet adv vs. GOC)  Nutrition Education/Counseling: No recommendation at this time  Coordination of Nutrition Care: Continue to monitor while inpatient       Goals:     Goals: Meet at least 75% of estimated needs, prior to discharge       Nutrition Monitoring and Evaluation:   Behavioral-Environmental Outcomes: None Identified  Food/Nutrient Intake Outcomes: Diet Advancement/Tolerance  Physical Signs/Symptoms Outcomes: Biochemical Data, Chewing or Swallowing, GI Status, Nutrition Focused Physical Findings, Weight    Discharge Planning:    Too soon to determine     78, MS, RD, LD  Contact: Lupita Raider

## 2021-10-11 NOTE — Progress Notes (Signed)
Pt unable to void after foley removal. Bladder scan reading of 590 and 580. Straight cath x2. Next one at 1000.   VSS.   Afib with controlled rate this shift. HR jumped to 170s this shift with out sustaining.   NPO since midnight for an EGD.   Barium swallow test today.   Will continue to monitor.

## 2021-10-11 NOTE — Progress Notes (Signed)
Pt to radiology at this time. [facility]U aware.

## 2021-10-11 NOTE — Progress Notes (Signed)
Progress Note      PCP: [name], APRN - CNP    Date of Admission: 10/06/2021    Chief Complaint: dizziness, altered mental status     Hospital Course: Lauren Bryant is a 87 year old female with past medical history of frequent UTIs, improved on outpatient trimethoprim, and hyperlipidemia who presented 10/13 with altered mental status.  Patient alerted to her medical bracelet that she was feeling dizzy at [place] [place] where she lives.  Her caretaker noted that when she woke up in the morning she was not acting like herself, was not talking or eating much.  When EMS arrived she noted her dizziness improved.  Per chart review family stated in the ED that patient has history of chronic dizziness and also endorsed patient was irritable with decreased p.o. intake for 2 days prior to admission.  They also reported patient experiences chronic dark stools.    In the ED Hemoccult was positive for melanotic stool, with troponin slightly elevated at 0.03.  H&H was 6.0/19.3 .  1 unit of PRBCs was transfused.  ECG revealed atrial flutter. Patient was admitted for further workup with CT head negative for acute process and chest x-ray unremarkable.  Patient was started on Protonix drip.  Additional 1 unit of PRBC was transfused 10/14, with H&H uptrending.       Cardiology was consulted.  A total of 3 doses of IV digoxin were administered (total 1 mg), with cardiology not recommending echo due to age and comorbidities making patient not a candidate for valvular intervention. Repeat troponin increased to 0.05 on 10/16.    Subjective: Patient seen today seated in a chair by the bedside. Clinically appears much improved, is able to interact and participate in interview. She is hard of hearing and did not have dentures in so difficult to converse. Daughter Lauren Bryant at the bedside helping to communicate with patient. Family endorses they do not want to proceed with EGD, main concern is patients nutritional status as  she has not eaten since last Thursday other than ice chips and a few spoons of applesauce. Modified barium swallow today to assess for aspiration to consider advancing diet.      Medications:  Reviewed    Infusion Medications    dextrose 5% in lactated ringers 75 mL/hr at 10/10/21 0934    sodium chloride      sodium chloride      sodium chloride       Scheduled Medications    pantoprazole (PROTONIX) 40 mg injection  40 mg IntraVENous Q12H    digoxin  125 mcg IntraVENous Daily    venlafaxine  37.5 mg Oral BID    enoxaparin  30 mg SubCUTAneous Daily    sodium chloride flush  5-40 mL IntraVENous 2 times per day     PRN Meds: melatonin, sodium chloride, haloperidol lactate, sodium chloride, sodium chloride, ondansetron **OR** ondansetron, sodium chloride flush, polyethylene glycol, acetaminophen **OR** acetaminophen      Intake/Output Summary (Last 24 hours) at 10/11/2021 1316  Last data filed at 10/11/2021 0809  Gross per 24 hour   Intake 30 ml   Output 1306 ml   Net -1276 ml       Physical Exam Performed:    BP 123/64    Pulse 76    Temp 97.5 ??F (36.4 ??C) (Axillary)    Resp 20    Ht 5\' 5"  (1.651 m)    Wt 100 lb 15.5 oz (45.8 kg)  LMP  (LMP Unknown)    SpO2 100%    BMI 16.80 kg/m??     General appearance: no acute distress, frail, appears stated age and somnolent.  HEENT: Pupils equal, round. Conjunctivae/corneas clear.  Neck: Supple, with full range of motion. No jugular venous distention.  Respiratory:  Increased respiratory effort. Clear to auscultation, bilaterally without Rales/Wheezes/Rhonchi.  Cardiovascular: Regular rate and rhythm without murmurs, rubs or gallops.  Abdomen: Soft, non-tender, non-distended.  Musculoskeletal: No clubbing, cyanosis or edema bilaterally.  .  Skin: Skin color, texture, turgor normal.  No rashes or lesions.  Neurologic:  Neurovascularly intact without any focal sensory deficits  Psychiatric: Alert and oriented, thought content appropriate, normal insight  Capillary Refill: Brisk,3  seconds, normal   Peripheral Pulses: +2 palpable, equal bilaterally       Labs:   Recent Labs     10/09/21  0822 10/10/21  0833 10/11/21  0644   WBC 10.6 10.5 8.4   HGB 10.0* 10.1* 9.9*   HCT 31.7* 32.3* 32.1*   PLT 244 242 220     Recent Labs     10/09/21  0822 10/10/21  0833 10/11/21  0644   NA 137 137 139   K 3.5 4.2 3.6   CL 100 102 101   CO2 18* 22 22   BUN 19 16 8    CREATININE 0.7 0.6 0.6   CALCIUM 8.5 8.9 8.7     Recent Labs     10/10/21  0833 10/11/21  0644   AST 34 32   ALT 19 20   BILITOT 0.6 0.6   ALKPHOS 51 48     No results for input(s): INR in the last 72 hours.  Recent Labs     10/09/21  0822 10/09/21  1142   TROPONINI 0.06* 0.05*       Urinalysis:      Lab Results   Component Value Date/Time    NITRU Negative 10/07/2021 09:50 AM    WBCUA 87 09/24/2021 03:21 AM    BACTERIA 4+ 09/24/2021 03:21 AM    RBCUA 1 09/24/2021 03:21 AM    BLOODU Negative 10/07/2021 09:50 AM    SPECGRAV 1.020 10/07/2021 09:50 AM    GLUCOSEU Negative 10/07/2021 09:50 AM       Radiology:  CT HEAD WO CONTRAST   Final Result   No acute intracranial abnormality.  There is no change from prior examination.         XR CHEST PORTABLE   Final Result   1.  No acute abnormality.         FL MODIFIED BARIUM SWALLOW W VIDEO    (Results Pending)           Assessment/Plan:    Active Hospital Problems    Diagnosis     Atrial fibrillation with RVR (HCC) [I48.91]      Priority: Medium    Elevated troponin [R77.8]      Priority: Medium    GIB (gastrointestinal bleeding) [K92.2]      Priority: Medium     Acute Blood Loss Anemia  - likely secondary to GI bleed  - positive FOBT on 10/13  - continue to trend H&H (9.9/32.1 today)  - Pt has received 2 units PRBC thus far  - GI consulted:              - patient family declining EGD, can consider outpatient workup     2. New onset A. Fib RVR  - continue  telemetry monitoring  - EKG 10/17 showing sinus rhythm with PACs and nonspecific T wave abnormalities  - patient received 3 doses IV digoxin (1 mg  total)  - continue IV digoxin 125 mcg daily  - aspirin 81 mg held  - cardiology consulted, appreciate input:              - if IV digoxin ineffective for HR control consider IV amio              - Echo cancelled, pt not candidate for valve intervention  - elevated troponin likely due to demand ischemia from tachycardia, no workup indicated.     3. Acute encephalopathy  - UA negative on 10/14  - Procalcitonin negative on 10/15  - CT head wo contrast negative for acute process on 10/13  - patient cannot have MRI due to metal in her ear     4. Dysphagia  -continue D5 in LR infusion 75 ml/hr  - Speech following for dysphagia, appreciate recs:   - modified barium swallow completed today 10/18, positive for aspiration   - per recommendation advanced diet to pureed mildly thick nectar, no straws, 1:1 supervision with intake, liquids by spoon only              - dietary following     5. HLD  - simvastatin 20 mg held     6. Chronic UTI  - trimethoprim 100 mg held  - foley replaced 10/18  - void trial unsuccessful 10/17, patient retained 580 mL urine, straight cath x3        DVT Prophylaxis: sequential compression device  Diet: Diet NPO Exceptions are: Ice Chips, Sips of Water with Meds  Diet NPO Exceptions are: [employer], Sips of Water with Meds  Code Status: DNR-CC     PT/OT Eval Status: recommended SNF at DC, AMPAC score of 7     Dispo - pending EGD and medical improvement     Nupur Hohman DO, PGY-1   [facility] [employer] [facility]

## 2021-10-11 NOTE — Care Coordination-Inpatient (Signed)
Patient's daughter requesting to speak with CM.  Spoke with daughter at bedside.  She states she has changed her mind about which facility she would like at discharge.  Her first two choices are [facility] or [facility] Placed call Erin with [facility] bus she states they will not have any beds until next week.  Placed call to Amy with [facility] and she states they have beds.  She will review and call back with whether or not they are able to accept.  Rapid covid ordered in anticipation of discharge within 1-2 days and RN aware.

## 2021-10-11 NOTE — Plan of Care (Signed)
Problem: Pain  Goal: Verbalizes/displays adequate comfort level or baseline comfort level  Outcome: Progressing  Note: Pt will be satisfied with pain control. RN uses Wong-Baker pain rating scale with reassessments after pain med administration. Will continue to monitor progression throughout shift.      Problem: Safety - Adult  Goal: Free from fall injury  Outcome: Progressing  Note: Pt high fall risk. Instructed to use call light before getting out of bed. Call light within reach. Bed in low position. Bed alarm on.  Will continue to monitor.

## 2021-10-11 NOTE — Procedures (Signed)
Speech Language Pathology      Speech Language Pathology  Modified Barium Swallow Study  Facility/Department: [facility] CARD TELEMETRY        Recommendations:  Diet recommendation: IDDSI 4 Puree Solids; IDDSI 2 Mildly Thick (nectar) Liquids (spoon sip only); Meds crushed in puree as able  Risk management: upright for all intake, stay upright for at least 30 mins after intake, small bites/sips, liquids by tsp only, no straws, 1:1 supervision with intake, oral care q4 hrs to reduce adverse affects in the event of aspiration, alternate bites/sips, and slow rate of intake    Consider comfort-oriented level of care versus hospice as pt will likely not be able to meet nutritional and hydration needs due to severity of her swallowing impairment       NAME:Lauren Bryant  DOB: 06-13-1924 (87 y.o.)   MRN: [id number]  ROOM: 0204/0204-01  ADMISSION DATE: 10/06/2021  PATIENT DIAGNOSIS(ES): Acute blood loss anemia [D62]  GIB (gastrointestinal bleeding) [K92.2]  Elevated troponin [R77.8]  Altered mental status, unspecified altered mental status type [R41.82]  Gastrointestinal hemorrhage, unspecified gastrointestinal hemorrhage type [K92.2]  Atrial flutter, unspecified type [facility]) [I48.92]  Chief Complaint   Patient presents with    Altered Mental Status     Pt from home, family could not wake her up, family told medics " she wasn't acting normal"     Patient Active Problem List    Diagnosis Date Noted    Atrial fibrillation with RVR (HCC) 10/09/2021    Elevated troponin 10/09/2021    GIB (gastrointestinal bleeding) 10/06/2021    Hyperlipidemia     Compression fracture of body of thoracic vertebra (HCC)     Frequent UTI     Pneumonia 03/20/2017     Past Medical History:   Diagnosis Date    Compression fracture of body of thoracic vertebra (HCC)     Frequent UTI     Hyperlipidemia     TIA 2015     Past Surgical History:   Procedure Laterality Date    HYSTERECTOMY (CERVIX STATUS UNKNOWN)      TUBAL LIGATION       Allergies   Allergen  Reactions    Macrobid [Nitrofurantoin] Rash         Current Diet Solid Consistency: NPO  Current Diet Liquid Consistency: NPO    Date of Prior Study: n/a  Type of Study: n/a  Results of Prior Study: n/a    Recent CXR/CT of Chest:     Patient Complaints/Reason for Referral:  Lauren Bryant was referred for a MBS to assess the efficiency of his/her swallow function, assess for aspiration, and to make recommendations regarding safe dietary consistencies, effective compensatory strategies, and safe eating environment.    Pain   Patient Currently in Pain: No    General Comments: Daughter requests to come back for MBS and SLP informed daughter that family members cannot be present in room during MBS.      Medical record review/interview:   Predisposing dysphagia risk factors: Hx of CVA and Age  Clinical signs of possible chronic dysphagia: reduced PO intake  Precipitating dysphagia risk factors: AMS      Impressions:  Treatment Dx and ICD 10: dysphagia  Radiologist: Dr. Vilma Bryant  Referring MD: Dr. Kyla Bryant    Impressions:  Severe oral phase dysphagia characterized by severely impaired oral containment of bolus (pt unable to hold liquid in mouth when asked to) with all po trials spilling prematurely to the pharynx prior to A-P  transmission of bolus, impaired lingual motion with repetitive lingual rocking/pumping (sometimes for time periods greater than 10 seconds) prior to A-P transit of bolus, poor velopharyngeal closure with po trials entering the base of the nasal cavity during height of swallow, and severely prolonged mastication of barium-coated cracker trial. Of note, premature spillage of masticated cracker was so significant that spilled cracker residue from oral cavity filled pyriform sinuses bilaterally during mastication and residuals spilled over from pyriform sinuses and penetrated laryngeal vestibule. No spontaneous cough and absent cued cough by pt.    Severe pharyngeal phase dysphagia characterized by delayed  swallow initiation (60ml spoon sip of thin pools in pyriform sinuses and is silently aspirated prior to swallow onset), impaired pharyngeal stripping wave (there is no contact between posterior pharyngeal wall and tongue base at height of swallow), decreased laryngeal elevation and impaired laryngeal vestibular closure during swallow, and poor tongue base retraction during swallow. As noted, pt exhibited silent aspiration of 5 ml spoon sip trial of thin liquids, and pt did not execute cued cough to clear aspirated liquid despite max cues from SLP. Pt unable to follow cues for compensatory swallow maneuvers. Pt tolerated spoon sip trials of nectar thick liquids without laryngeal penetration nor aspiration. However, with cup sip trials of nectar, pt's swallow initiation is so delayed that pt exhibits deep laryngeal penetration of nectar thick liquids before swallow onset, exhibits no spontaneous cough and does not follow commands to cough to clear. Residuals are as follows: thin liquids (spoon sip) - mild pooling in valleculae; nectar thick liquids - mild pooling in valleculae with stranding to pyriform sinuses bilaterally; puree - mod-severe residuals in valleculae (nectar thick liquid wash via spoon resulted in decrease to min-mod residuals); cracker - severe vallecular residuals, which clears to moderate with nectar thick liquid wash via spoon. Of note, there is coating along posterior pharyngeal wall and velopharyngeal port with all po trials due to severity of impairment in pharyngeal contraction and velopharyngeal closure.    Oral Preparation / Oral Phase  Impaired  Oral Phase - Major Contributing Deficits  Poor Mastication: Regular   Weak Lingual Manipulation: All  Lingual Pumping: Regular , All  Lingual Rocking: All  Reduced Bolus Control: All  Decreased Bolus Cohesion: Barium-coated cracker  Premature Bolus Loss to Pharynx: All  Decreased Velopharyngeal Closure: All  Oral phase comment: Severe oral phase  dysphagia characterized by severely impaired oral containment of bolus (pt unable to hold liquid in mouth when asked to) with all po trials spilling prematurely to the pharynx prior to A-P transmission of bolus, impaired lingual motion with repetitive lingual rocking/pumping (sometimes for time periods greater than 10 seconds) prior to A-P transit of bolus, poor velopharyngeal closure with po trials entering the base of the nasal cavity during height of swallow, and severely prolonged mastication of barium-coated cracker trial. Of note, premature spillage of masticated cracker was so significant that spilled cracker residue from oral cavity filled pyriform sinuses bilaterally during mastication and residuals spilled over from pyriform sinuses and penetrated laryngeal vestibule. No spontaneous cough and absent cued cough by pt.     Pharyngeal Phase  Impaired  Pharyngeal Phase - Major Contributing Deficits  Delayed Swallow Initiation: Severe  Reduced Pharyngeal Peristalsis: Severe  Reduced Epiglottic Retroversion  Reduced Laryngeal Elevation  Reduced Anterior Hyolaryngeal Movement  Reduced Laryngeal-vestibular Closure  Reduced Tongue Base Retraction:  Severe  Pharyngeal phase comment:  Severe pharyngeal phase dysphagia characterized by delayed swallow initiation (16ml spoon sip of thin pools in  pyriform sinuses and is silently aspirated prior to swallow onset), impaired pharyngeal stripping wave (there is no contact between posterior pharyngeal wall and tongue base at height of swallow), decreased laryngeal elevation and impaired laryngeal vestibular closure during swallow, and poor tongue base retraction during swallow. As noted, pt exhibited silent aspiration of 5 ml spoon sip trial of thin liquids, and pt did not execute cued cough to clear aspirated liquid despite max cues from SLP. Pt unable to follow cues for compensatory swallow maneuvers. Pt tolerated spoon sip trials of nectar thick liquids without laryngeal  penetration nor aspiration. However, with cup sip trials of nectar, pt's swallow initiation is so delayed that pt exhibits deep laryngeal penetration of nectar thick liquids before swallow onset, exhibits no spontaneous cough and does not follow commands to cough to clear. Residuals are as follows: thin liquids (spoon sip) - mild pooling in valleculae; nectar thick liquids - mild pooling in valleculae with stranding to pyriform sinuses bilaterally; puree - mod-severe residuals in valleculae (nectar thick liquid wash via spoon resulted in decrease to min-mod residuals); cracker - severe vallecular residuals, which clears to moderate with nectar thick liquid wash via spoon. Of note, there is coating along posterior pharyngeal wall and velopharyngeal port with all po trials due to severity of impairment in pharyngeal contraction and velopharyngeal closure.        Esophageal Phase  WFL with pt sitting upright in MBS chair throughout study      Aspiration Scale    1 Material does not enter the airway    2 Material enters the airway, remains above the vocal folds, and is ejected from the airway    3 Material enters the airway, remains above the vocal folds, and is not ejected from the airway    4 Material enters the airway, contacts the vocal folds, an is ejected from the airway    5 Material enters the airway, contacts the vocal folds, and is not ejected from the airway    6 Material enters the airway, passes below the vocal folds and is ejected into the larynx or out of the airway    7 Material enters the airway, passes below the vocal folds, and is not ejected from the trachea despite effort   x 8 Material enters the airway, passes below the vocal folds, and no effort is made to eject.           Compensatory Swallowing Strategies Attempted: spoon sip trials of nectar thick, alternating solids and liquids  Postural Changes and/or Swallow Maneuvers Trialed: pt unable to follow directions for compensatory  strategies  Patient Position: Lateral and Patient Degrees: 90 degrees, Seated upright in MBSS chair  Consistencies Administered: thin liquids (spoon), nectar thick (spoon sip, cup sip), puree, barium-coated cracker      Recommendations:  Diet recommendation: IDDSI 4 Puree Solids; IDDSI 2 Mildly Thick (nectar) Liquids (spoon sip only); Meds crushed in puree as able  Risk management: upright for all intake, stay upright for at least 30 mins after intake, small bites/sips, liquids by tsp only, no straws, 1:1 supervision with intake, oral care q4 hrs to reduce adverse affects in the event of aspiration, alternate bites/sips, and slow rate of intake    Consider comfort-oriented level of care versus hospice as pt will likely not be able to meet nutritional and hydration needs due to severity of her swallowing impairment     Safe Swallow Protocol:  Supervision: needs 1:1 supervision  Compensatory Swallowing Strategies: HOB 90*  and 30" after meals; small bites/sips; alternate solids/liquids every 3-5 bites; oral care after every meal, nectar thick liquids via spoon only; alternate solids and liquids      Behavior/Cognition/Vision/Hearing:  Behavior/Cognition: alert but very confused and not following commands during MBS, even when yelled into her "good ear"  Vision: impaired  Hearing: severe HOH; left ear is "good ear"    Recommendations/Treatment  Requires SLP Intervention: yes  D/C Recommendations: defer to PT/OT  Postural Changes and/or Swallow Maneuvers: HOB 90* and 30" after meals; small bites/sips; alternate solids/liquids every 3-5 bites; oral care after every meal, nectar thick liquids via spoon only; alternate solids and liquids  Referral To: hospice    Recommended Exercises:   Therapeutic Interventions: diet tolerance monitoring, patient/family education, oral care     Education: Images and recommendations were reviewed with pt following this exam.   Patient Education Response: pt does not demonstrate  comprehension    Prognosis for safe diet advancement: fair  Barriers to reach goals: cognitive deficits  Duration/Frequency of Treatment: 3x/wk for 1 week until 10/18/21  Safety Devices in place: Yes  Type of devices: Pt left MBSS in no distress; left with transport      Goals:    Long Term: 1-2 weeks  Pt will tolerate recommended diet without observed s/s of aspiration.  Short Term: 1 week, 3x/wk until 10/18/21  Timeframe for Short-term Goals:  Pt will tolerate recommended diet without observed s/s of aspiration.      Therapy Time:   Individual Concurrent Group Co-treatment   Time In 1250         Time Out 1318         Minutes 28             Signature:    Caya Soberanis L. [place], [place] CCC-SLP ST#[id number]  Speech-Language Pathologist

## 2021-10-11 NOTE — Progress Notes (Signed)
Pt unable to void, bladder scan with 596 mL.  Indwelling foley placed per sterile technique, clear, yellow urine noted draining.

## 2021-10-11 NOTE — Progress Notes (Signed)
Gastroenterology Progress Note    Patient:   Lauren Bryant   Date of Birth:  1924/12/24   Facility:   [facility]   Referring/PCP: [name], APRN - CNP  Date:     10/11/2021  Consultant:   Megan Salon, MD, MD    Subjective:     This 87 y.o. female was admitted 10/06/2021 with a diagnosis of "Acute blood loss anemia [D62]  GIB (gastrointestinal bleeding) [K92.2]  Elevated troponin [R77.8]  Altered mental status, unspecified altered mental status type [R41.82]  Gastrointestinal hemorrhage, unspecified gastrointestinal hemorrhage type [K92.2]  Atrial flutter, unspecified type (HCC) [I48.92]" and is seen in consultation regarding "melena". Information was obtained from interview of  the patient and the patient's daughter, examination of the patient, and review of records. I did  update the past medical, surgical, social and / or family history.    Melena for days mild in GI tract assoc w anemia    Current status  Present  Diet Order: Diet NPO Exceptions are: Ice Chips, Sips of Water with Meds and she is tolerating diet.  Recently, she has experienced no abdominal  Pain and she has not required Intravenous narcotic analgesics.  The patient has also experienced no constipation, diarrhea, hematochezia, nausea, and vomiting      Prior to Admission medications    Medication Sig Start Date End Date Taking? Authorizing Provider   ALPRAZolam (XANAX) 0.25 MG tablet Take 0.25 mg by mouth 3 times daily as needed for Anxiety or Sleep.    Historical Provider, MD   trimethoprim (TRIMPEX) 100 MG tablet Take 1 tablet by mouth daily 05/03/21   [name], APRN - CNP   simvastatin (ZOCOR) 20 MG tablet Take 1 tablet by mouth nightly 05/02/21   [name], APRN - CNP   venlafaxine (EFFEXOR XR) 37.5 MG extended release capsule TAKE 1 CAPSULE TWICE A DAY 04/06/21   Taya Jeraldine Loots, APRN - CNP   docusate sodium (COLACE) 100 MG capsule Take 100 mg by mouth 2 times daily    Historical Provider, MD    ferrous sulfate 325 (65 Fe) MG tablet Take 325 mg by mouth daily (with breakfast)    Historical Provider, MD   aspirin 81 MG tablet Take 81 mg by mouth daily    Historical Provider, MD      Scheduled Medications:    pantoprazole (PROTONIX) 40 mg injection  40 mg IntraVENous Q12H    digoxin  125 mcg IntraVENous Daily    venlafaxine  37.5 mg Oral BID    enoxaparin  30 mg SubCUTAneous Daily    sodium chloride flush  5-40 mL IntraVENous 2 times per day     Infusions:    dextrose 5% in lactated ringers 75 mL/hr at 10/10/21 0934    sodium chloride      sodium chloride      sodium chloride       PRN Medications: melatonin, sodium chloride, haloperidol lactate, sodium chloride, sodium chloride, ondansetron **OR** ondansetron, sodium chloride flush, polyethylene glycol, acetaminophen **OR** acetaminophen  Allergies:   Allergies   Allergen Reactions    Macrobid [Nitrofurantoin] Rash       Past Medical History:   Diagnosis Date    Compression fracture of body of thoracic vertebra (HCC)     Frequent UTI     Hyperlipidemia     TIA 2015     Past Surgical History:   Procedure Laterality Date    HYSTERECTOMY (CERVIX STATUS UNKNOWN)  TUBAL LIGATION         Social:   Social History     Tobacco Use    Smoking status: Former     Packs/day: 0.50     Types: Cigarettes     Quit date: 03/01/2020     Years since quitting: 1.6    Smokeless tobacco: Never   Substance Use Topics    Alcohol use: No     Family: History reviewed. No pertinent family history.    ROS: Pertinent items are noted in HPI.    Objective:   Vital Signs:  Temp (24hrs), Avg:98 ??F (36.7 ??C), Min:97.4 ??F (36.3 ??C), Max:98.2 ??F (36.8 ??C)     Systolic (24hrs), Avg:139 , Min:123 , Max:169      Diastolic (24hrs), Avg:67, Min:62, Max:75     Pulse  Avg: 78.7  Min: 74  Max: 87  BP 126/62    Pulse 80    Temp 98.2 ??F (36.8 ??C) (Axillary)    Resp 16    Wt 100 lb 15.5 oz (45.8 kg)    LMP  (LMP Unknown)    SpO2 99%    BMI 16.80 kg/m??      Physical Exam:   BP 126/62    Pulse 80     Temp 98.2 ??F (36.8 ??C) (Axillary)    Resp 16    Wt 100 lb 15.5 oz (45.8 kg)    LMP  (LMP Unknown)    SpO2 99%    BMI 16.80 kg/m??   General appearance: Alert but confused, NAD  Heart: regular rate and rhythm, S1, S2 normal, no murmur, click, rub or gallop  Abdomen: soft, non-tender; bowel sounds normal; no masses,  no organomegaly      Lab and Imaging Review   Recent Labs     10/08/21  2015 10/09/21  0822 10/10/21  0833 10/11/21  0644   WBC 10.0 10.6 10.5 8.4   HGB 9.7* 10.0* 10.1* 9.9*   MCV 91.2 92.1 92.4 92.7   PLT 225 244 242 220   NA 137 137 137 139   K 3.8 3.5 4.2 3.6   CL 99 100 102 101   CO2 19* 18* 22 22   BUN 18 19 16 8    CREATININE 0.7 0.7 0.6 0.6   GLUCOSE 84 84 67* 115*   CALCIUM 8.6 8.5 8.9 8.7   PROT  --   --  5.4* 5.3*   LABALBU  --   --  3.5 3.4   AST  --   --  34 32   ALT  --   --  19 20   ALKPHOS  --   --  51 48   BILITOT  --   --  0.6 0.6   MG  --  2.00  --  1.80         Assessment:     Patient Active Problem List    Diagnosis Date Noted    Atrial fibrillation with RVR (HCC) 10/09/2021    Elevated troponin 10/09/2021    GIB (gastrointestinal bleeding) 10/06/2021    Hyperlipidemia     Compression fracture of body of thoracic vertebra (HCC)     Frequent UTI     Pneumonia 03/20/2017     87 yo w dementia and Fe def anemia w black stools admitted w acute encephalopathy, improving, and new onset afib w RVR, but found to have anemia and heme-pos stools. H/H 6/19, transfused.    Plan:  Supportive care  Was started on IV Dig.  Diet per primary team and Speech  EGD planned for this am was canceled reportedly by her daughter eventhough she had agreed to do it when we had talked  No plans for colonoscopy either  Will follow peripherally    Megan Salon, MD       (O) [phone]

## 2021-10-12 LAB — CBC WITH AUTO DIFFERENTIAL
Basophils %: 1 %
Basophils Absolute: 0.1 10*3/uL (ref 0.0–0.2)
Eosinophils %: 3.6 %
Eosinophils Absolute: 0.3 10*3/uL (ref 0.0–0.6)
Hematocrit: 29.4 % — ABNORMAL LOW (ref 36.0–48.0)
Hemoglobin: 9.2 g/dL — ABNORMAL LOW (ref 12.0–16.0)
Lymphocytes %: 7.5 %
Lymphocytes Absolute: 0.5 10*3/uL — ABNORMAL LOW (ref 1.0–5.1)
MCH: 28 pg (ref 26.0–34.0)
MCHC: 31.2 g/dL (ref 31.0–36.0)
MCV: 89.9 fL (ref 80.0–100.0)
MPV: 11.2 fL — ABNORMAL HIGH (ref 5.0–10.5)
Monocytes %: 8.9 %
Monocytes Absolute: 0.7 10*3/uL (ref 0.0–1.3)
Neutrophils %: 79 %
Neutrophils Absolute: 5.8 10*3/uL (ref 1.7–7.7)
Platelets: 200 10*3/uL (ref 135–450)
RBC: 3.27 M/uL — ABNORMAL LOW (ref 4.00–5.20)
RDW: 15.2 % (ref 12.4–15.4)
WBC: 7.3 10*3/uL (ref 4.0–11.0)

## 2021-10-12 LAB — COMPREHENSIVE METABOLIC PANEL
ALT: 17 U/L (ref 10–40)
AST: 23 U/L (ref 15–37)
Albumin/Globulin Ratio: 1.9 (ref 1.1–2.2)
Albumin: 3.2 g/dL — ABNORMAL LOW (ref 3.4–5.0)
Alkaline Phosphatase: 43 U/L (ref 40–129)
Anion Gap: 9 (ref 3–16)
BUN: 5 mg/dL — ABNORMAL LOW (ref 7–20)
CO2: 29 mmol/L (ref 21–32)
Calcium: 8.5 mg/dL (ref 8.3–10.6)
Chloride: 101 mmol/L (ref 99–110)
Creatinine: <0.5 mg/dL — ABNORMAL LOW (ref 0.6–1.2)
Est, Glom Filt Rate: >60 (ref >60)
Glucose: 140 mg/dL — ABNORMAL HIGH (ref 70–99)
Potassium: 3.1 mmol/L — ABNORMAL LOW (ref 3.5–5.1)
Sodium: 139 mmol/L (ref 136–145)
Total Bilirubin: 0.5 mg/dL (ref 0.0–1.0)
Total Protein: 4.9 g/dL — ABNORMAL LOW (ref 6.4–8.2)

## 2021-10-12 LAB — POCT GLUCOSE
POC Glucose: 109 mg/dl — ABNORMAL HIGH (ref 70–99)
POC Glucose: 114 mg/dl — ABNORMAL HIGH (ref 70–99)
POC Glucose: 124 mg/dl — ABNORMAL HIGH (ref 70–99)
POC Glucose: 125 mg/dl — ABNORMAL HIGH (ref 70–99)
POC Glucose: 128 mg/dl — ABNORMAL HIGH (ref 70–99)
POC Glucose: 135 mg/dl — ABNORMAL HIGH (ref 70–99)

## 2021-10-12 LAB — MAGNESIUM: Magnesium: 1.7 mg/dL — ABNORMAL LOW (ref 1.80–2.40)

## 2021-10-12 MED ORDER — LORAZEPAM 2 MG/ML IJ SOLN
2 MG/ML | Freq: Once | INTRAMUSCULAR | Status: AC
Start: 2021-10-12 — End: 2021-10-12
  Administered 2021-10-12: 08:00:00 1 mg via INTRAVENOUS

## 2021-10-12 MED ORDER — MAGNESIUM SULFATE 2000 MG/50 ML IVPB PREMIX
2 GM/50ML | Freq: Once | INTRAVENOUS | Status: AC
Start: 2021-10-12 — End: 2021-10-12
  Administered 2021-10-12: 14:00:00 2000 mg via INTRAVENOUS

## 2021-10-12 MED ORDER — POTASSIUM CHLORIDE 10 MEQ/100ML IV SOLN
10 MEQ/0ML | INTRAVENOUS | Status: AC
Start: 2021-10-12 — End: 2021-10-12
  Administered 2021-10-12 (×4): 10 meq via INTRAVENOUS

## 2021-10-12 MED ORDER — QUETIAPINE FUMARATE 25 MG PO TABS
25 MG | Freq: Every evening | ORAL | Status: DC
Start: 2021-10-12 — End: 2021-10-13

## 2021-10-12 MED ORDER — DEXTROSE IN LACTATED RINGERS 5 % IV SOLN
5 % | INTRAVENOUS | Status: DC
Start: 2021-10-12 — End: 2021-10-13
  Administered 2021-10-12 – 2021-10-13 (×2): via INTRAVENOUS

## 2021-10-12 MED ORDER — DIGOXIN 125 MCG PO TABS
125 MCG | Freq: Every day | ORAL | Status: DC
Start: 2021-10-12 — End: 2021-10-12

## 2021-10-12 MED ORDER — DIGOXIN 0.25 MG/ML IJ SOLN
0.25 MG/ML | Freq: Every day | INTRAMUSCULAR | Status: DC
Start: 2021-10-12 — End: 2021-10-13
  Administered 2021-10-12 – 2021-10-13 (×2): 125 ug via INTRAVENOUS

## 2021-10-12 MED FILL — HALOPERIDOL LACTATE 5 MG/ML IJ SOLN: 5 MG/ML | INTRAMUSCULAR | Qty: 1

## 2021-10-12 MED FILL — MAGNESIUM SULFATE 2 GM/50ML IV SOLN: 2 GM/50ML | INTRAVENOUS | Qty: 50

## 2021-10-12 MED FILL — DIGOXIN 0.25 MG/ML IJ SOLN: 0.25 MG/ML | INTRAMUSCULAR | Qty: 2

## 2021-10-12 MED FILL — DEXTROSE IN LACTATED RINGERS 5 % IV SOLN: 5 % | INTRAVENOUS | Qty: 1000

## 2021-10-12 MED FILL — ATIVAN 2 MG/ML IJ SOLN: 2 MG/ML | INTRAMUSCULAR | Qty: 1

## 2021-10-12 MED FILL — PROTONIX 40 MG IV SOLR: 40 MG | INTRAVENOUS | Qty: 40

## 2021-10-12 MED FILL — POTASSIUM CHLORIDE 10 MEQ/100ML IV SOLN: 10 MEQ/0ML | INTRAVENOUS | Qty: 100

## 2021-10-12 MED FILL — LOVENOX 30 MG/0.3ML IJ SOSY: 30 MG/0.3ML | INTRAMUSCULAR | Qty: 0.3

## 2021-10-12 NOTE — Care Coordination-Inpatient (Signed)
Received call from Amy with [facility] who states they are unable to meet patient's needs due to patient's agitation this morning and her overall mentation.  Discussed with patient's daughter.  She states her next choice would be [facility].  Placed call to Maralyn Sago with [facility] and notified of referral.  She states they are rearranging some beds/rooms so she will call back with whether or not they can accept and whether or not they will have a bed.  Daughter updated.

## 2021-10-12 NOTE — Progress Notes (Signed)
Physician Progress Note      PATIENTELESHA, Lauren Bryant  CSN #:                  [id number]  DOB:                       24-May-1924  ADMIT DATE:       10/06/2021 10:55 AM  DISCH DATE:  RESPONDING  PROVIDER #:        Jodie Echevaria MD          QUERY TEXT:    Pt admitted with GI bleed w/ ABLA/MS change/ new afib and has "Acute   encephalopathy- with baseline dementia" documented. If possible, please   document in progress notes and discharge summary further specificity regarding   the type of encephalopathy:      The medical record reflects the following:  Risk Factors: dementia GI bleed, ABLA Frequent UTI new afib w/ RVR  Clinical Indicators: H&H- OA- 6/19,  RVR 168bpm.   Cardiology consult- She   presented after SNF staff noted increased irritability and unresponsiveness   with decreased po intake. Also noted dark stools-She developed rapid afib   early this AM and hypotensive"   PN-  "admitted w acute encephalopathy,   improving"  Treatment: Cardiology and GI consult, 2u prbc given -monitored h/h,  Placed in   Trendelenburg position and given IVF and digoxin 0.5mg  IV x 1.  -2 more doses   of IV digoxin, ECHO cancelled, Palliative consult in place    Thank-You, Patti Conine RN, BSN, CCDS  Options provided:  -- Acute Metabolic encephalopathy (with underlying dementia) possibly due to   ABLA, urinary retention, hypotension/new afib  -- Other - I will add my own diagnosis  -- Disagree - Not applicable / Not valid  -- Disagree - Clinically unable to determine / Unknown  -- Refer to Clinical Documentation Reviewer    PROVIDER RESPONSE TEXT:    This patient has metabolic encephalopathy (with underlying dementia) possibly   due to ABLA, urinary retention and hypotension/new onset afib.    Query created by: Colvin Caroli on 10/12/2021 12:39 PM      Electronically signed by:  Jodie Echevaria MD 10/12/2021 2:18 PM

## 2021-10-12 NOTE — Progress Notes (Signed)
Progress Note      PCP: [name], APRN - CNP    Date of Admission: 10/06/2021    Chief Complaint: dizziness, altered mental status     Hospital Course: Lauren Bryant is a 87 year old female with past medical history of frequent UTIs, improved on outpatient trimethoprim, and hyperlipidemia who presented 10/13 with altered mental status.  Patient alerted to her medical bracelet that she was feeling dizzy at [employer] where she lives.  Her caretaker noted that when she woke up in the morning she was not acting like herself, was not talking or eating much.  When EMS arrived she noted her dizziness improved.  Per chart review family stated in the ED that patient has history of chronic dizziness and also endorsed patient was irritable with decreased p.o. intake for 2 days prior to admission.  They also reported patient experiences chronic dark stools.    In the ED Hemoccult was positive for melanotic stool, with troponin slightly elevated at 0.03.  H&H was 6.0/19.3 .  1 unit of PRBCs was transfused.  ECG revealed atrial flutter. Patient was admitted for further workup with CT head negative for acute process and chest x-ray unremarkable.  Patient was started on Protonix drip.  Additional 1 unit of PRBC was transfused 10/14, with H&H uptrending.       Cardiology was consulted.  A total of 3 doses of IV digoxin were administered (total 1 mg), with cardiology not recommending echo due to age and comorbidities making patient not a candidate for valvular intervention. Repeat troponin increased to 0.05 on 10/16.    Subjective: Patient seen today lying in bed, much more somnolent than yesterday. Patient received IM haldol and ativan this morning due to agitation. Daughter Lauren Bryant at the bedside reports patient was able to eat pureed food yesterday. Diet was advanced per recommendations of speech after barium swallow was completed 10/18. Family declining EGD, prefer to consider as outpatient. No acute  complaints today      Medications:  Reviewed    Infusion Medications    dextrose 5% in lactated ringers 50 mL/hr at 10/12/21 1138    sodium chloride      sodium chloride      sodium chloride 25 mL/hr at 10/12/21 1006     Scheduled Medications    digoxin  125 mcg IntraVENous Daily    QUEtiapine  25 mg Oral Nightly    pantoprazole (PROTONIX) 40 mg injection  40 mg IntraVENous Q12H    venlafaxine  37.5 mg Oral BID    enoxaparin  30 mg SubCUTAneous Daily    sodium chloride flush  5-40 mL IntraVENous 2 times per day     PRN Meds: melatonin, sodium chloride, haloperidol lactate, sodium chloride, sodium chloride, ondansetron **OR** ondansetron, sodium chloride flush, polyethylene glycol, acetaminophen **OR** acetaminophen      Intake/Output Summary (Last 24 hours) at 10/12/2021 1800  Last data filed at 10/12/2021 1633  Gross per 24 hour   Intake 50 ml   Output 1300 ml   Net -1250 ml       Physical Exam Performed:    BP 122/62    Pulse 100    Temp 98.1 ??F (36.7 ??C) (Axillary)    Resp 18    Ht 5\' 5"  (1.651 m)    Wt 100 lb 15.5 oz (45.8 kg)    LMP  (LMP Unknown)    SpO2 99%    BMI 16.80 kg/m??     General  appearance: no acute distress, frail, appears stated age and somnolent. Not interactive  HEENT: Pupils equal, round. Conjunctivae/corneas clear.  Neck: Supple, with full range of motion. No jugular venous distention.  Respiratory:  Increased respiratory effort. Clear to auscultation, bilaterally without Rales/Wheezes/Rhonchi.  Cardiovascular: Regular rate and rhythm without murmurs, rubs or gallops.  Abdomen: Soft, non-tender, non-distended.  Musculoskeletal: No clubbing, cyanosis or edema bilaterally.  .  Skin: Skin color, texture, turgor normal.  No rashes or lesions.  Neurologic:  Neurovascularly intact without any focal sensory deficits  Psychiatric: Alert and oriented, thought content appropriate, normal insight  Capillary Refill: Brisk,3 seconds, normal   Peripheral Pulses: +2 palpable, equal bilaterally       Labs:    Recent Labs     10/10/21  0833 10/11/21  0644 10/12/21  0703   WBC 10.5 8.4 7.3   HGB 10.1* 9.9* 9.2*   HCT 32.3* 32.1* 29.4*   PLT 242 220 200     Recent Labs     10/10/21  0833 10/11/21  0644 10/12/21  0703   NA 137 139 139   K 4.2 3.6 3.1*   CL 102 101 101   CO2 22 22 29    BUN 16 8 5*   CREATININE 0.6 0.6 <0.5*   CALCIUM 8.9 8.7 8.5     Recent Labs     10/10/21  0833 10/11/21  0644 10/12/21  0703   AST 34 32 23   ALT 19 20 17    BILITOT 0.6 0.6 0.5   ALKPHOS 51 48 43     No results for input(s): INR in the last 72 hours.  No results for input(s): CKTOTAL, TROPONINI in the last 72 hours.    Urinalysis:      Lab Results   Component Value Date/Time    NITRU Negative 10/07/2021 09:50 AM    WBCUA 87 09/24/2021 03:21 AM    BACTERIA 4+ 09/24/2021 03:21 AM    RBCUA 1 09/24/2021 03:21 AM    BLOODU Negative 10/07/2021 09:50 AM    SPECGRAV 1.020 10/07/2021 09:50 AM    GLUCOSEU Negative 10/07/2021 09:50 AM       Radiology:  10/09/2021 MODIFIED BARIUM SWALLOW W VIDEO   Final Result   Positive for aspiration.      Please see separate speech pathology report for full discussion of findings   and recommendations.         CT HEAD WO CONTRAST   Final Result   No acute intracranial abnormality.  There is no change from prior examination.         XR CHEST PORTABLE   Final Result   1.  No acute abnormality.                 Assessment/Plan:    Active Hospital Problems    Diagnosis     Atrial fibrillation with RVR (HCC) [I48.91]      Priority: Medium    Elevated troponin [R77.8]      Priority: Medium    GIB (gastrointestinal bleeding) [K92.2]      Priority: Medium     Acute Blood Loss Anemia  - no overt signs of bleeding  - positive FOBT on 10/13  - continue to trend H&H (9.2/29.4 today)  - Pt has received 2 units PRBC thus far  - GI consulted:              - patient family declining EGD, can consider outpatient workup  2. New onset A. Fib RVR  - continue telemetry monitoring  - EKG 10/17 showing sinus rhythm with PACs and nonspecific T  wave abnormalities  - patient received 3 doses IV digoxin (1 mg total)  - continue IV digoxin 125 mcg daily  - aspirin 81 mg held  - cardiology consulted, appreciate input:              - if IV digoxin ineffective for HR control consider IV amio              - Echo cancelled, pt not candidate for valve intervention  - elevated troponin likely due to demand ischemia from tachycardia, no workup indicated.     3. Acute metabolic encephalopathy (with underlying dementia)  -possibly due to acute blood loss anemia, urinary retention, and hypotension / new onset A.Fib  - UA negative on 10/14  - Procalcitonin negative on 10/15  - CT head wo contrast negative for acute process on 10/13  - patient cannot have MRI due to metal in her ear  - start quetiapine 25 mg oral nightly for insomnia and night-time agitation, recommend crushing and give in puree per dietary     4. Dysphagia  -continue D5 in LR infusion 50 ml/hr  - Speech following for dysphagia, appreciate recs:              - modified barium swallow completed today 10/18, positive for aspiration              - per recommendation advanced diet to pureed mildly thick nectar, no straws, 1:1 supervision with intake, liquids by spoon only              - dietary following     5. HLD  - simvastatin 20 mg held     6. Chronic UTI  - trimethoprim 100 mg held  - foley replaced 10/18  - void trial unsuccessful 10/17, patient retained 580 mL urine, straight cath x3        DVT Prophylaxis: sequential compression device  Diet: Diet NPO Exceptions are: Ice Chips, Sips of Water with Meds  Diet NPO Exceptions are: [employer], Sips of Water with Meds  Code Status: DNR-CC     PT/OT Eval Status: recommended SNF at DC, AMPAC score of 7     Dispo - pending clinical improvement likely 1-2 days, planned to discharge to [facility]     Zonia Caplin DO, PGY-1   [facility] [employer] [facility]

## 2021-10-12 NOTE — Progress Notes (Signed)
Physical Therapy/Occupational Therapy  Attempted to see pt for PT/OT. Per RN, pt recently received Ativan d/t agitation. Pt soundly sleeping on approach. Will hold therapy until pt able to participate in skilled therapy. Thank you.  Tomasa Rand, PT, DPT  Laqueta Linden, OTR/L

## 2021-10-12 NOTE — Progress Notes (Signed)
Gastroenterology Progress Note    Patient:   Lauren Bryant   Date of Birth:  1924/02/16   Facility:   [facility]   Referring/PCP: [name], APRN - CNP  Date:     10/12/2021  Consultant:   Megan Salon, MD, MD    Subjective:     This 87 y.o. female was admitted 10/06/2021 with a diagnosis of "Acute blood loss anemia [D62]  GIB (gastrointestinal bleeding) [K92.2]  Elevated troponin [R77.8]  Altered mental status, unspecified altered mental status type [R41.82]  Gastrointestinal hemorrhage, unspecified gastrointestinal hemorrhage type [K92.2]  Atrial flutter, unspecified type (HCC) [I48.92]" and is seen in consultation regarding "melena". Information was obtained from interview of  the patient and the patient's daughter, examination of the patient, and review of records. I did  update the past medical, surgical, social and / or family history.    Melena for days mild in GI tract assoc w anemia    Current status  Present  Diet Order: ADULT DIET; Dysphagia - Pureed; Mildly Thick (Nectar); No Drinking Straws, Liquids by spoon only and she is tolerating diet.  Recently, she has experienced no abdominal  Pain and she has not required Intravenous narcotic analgesics.  The patient has also experienced no constipation, diarrhea, hematochezia, nausea, and vomiting      Prior to Admission medications    Medication Sig Start Date End Date Taking? Authorizing Provider   ALPRAZolam (XANAX) 0.25 MG tablet Take 0.25 mg by mouth 3 times daily as needed for Anxiety or Sleep.    Historical Provider, MD   trimethoprim (TRIMPEX) 100 MG tablet Take 1 tablet by mouth daily 05/03/21   [name], APRN - CNP   simvastatin (ZOCOR) 20 MG tablet Take 1 tablet by mouth nightly 05/02/21   [name], APRN - CNP   venlafaxine (EFFEXOR XR) 37.5 MG extended release capsule TAKE 1 CAPSULE TWICE A DAY 04/06/21   Taya Jeraldine Loots, APRN - CNP   docusate sodium (COLACE) 100 MG capsule Take 100 mg by mouth 2 times  daily    Historical Provider, MD   ferrous sulfate 325 (65 Fe) MG tablet Take 325 mg by mouth daily (with breakfast)    Historical Provider, MD   aspirin 81 MG tablet Take 81 mg by mouth daily    Historical Provider, MD      Scheduled Medications:    pantoprazole (PROTONIX) 40 mg injection  40 mg IntraVENous Q12H    digoxin  125 mcg IntraVENous Daily    venlafaxine  37.5 mg Oral BID    enoxaparin  30 mg SubCUTAneous Daily    sodium chloride flush  5-40 mL IntraVENous 2 times per day     Infusions:    dextrose 5% in lactated ringers 75 mL/hr at 10/11/21 1737    sodium chloride      sodium chloride      sodium chloride       PRN Medications: melatonin, sodium chloride, haloperidol lactate, sodium chloride, sodium chloride, ondansetron **OR** ondansetron, sodium chloride flush, polyethylene glycol, acetaminophen **OR** acetaminophen  Allergies:   Allergies   Allergen Reactions    Macrobid [Nitrofurantoin] Rash       Past Medical History:   Diagnosis Date    Compression fracture of body of thoracic vertebra (HCC)     Frequent UTI     Hyperlipidemia     TIA 2015     Past Surgical History:   Procedure Laterality Date  HYSTERECTOMY (CERVIX STATUS UNKNOWN)      TUBAL LIGATION         Social:   Social History     Tobacco Use    Smoking status: Former     Packs/day: 0.50     Types: Cigarettes     Quit date: 03/01/2020     Years since quitting: 1.6    Smokeless tobacco: Never   Substance Use Topics    Alcohol use: No     Family: History reviewed. No pertinent family history.    ROS: Pertinent items are noted in HPI.    Objective:   Vital Signs:  Temp (24hrs), Avg:98 ??F (36.7 ??C), Min:97.5 ??F (36.4 ??C), Max:98.6 ??F (37 ??C)     Systolic (24hrs), Avg:122 , Min:113 , Max:130      Diastolic (24hrs), Avg:65, Min:52, Max:78     Pulse  Avg: 75.4  Min: 64  Max: 80  BP 124/68    Pulse 73    Temp 97.9 ??F (36.6 ??C) (Axillary)    Resp 16    Ht 5\' 5"  (1.651 m)    Wt 100 lb 15.5 oz (45.8 kg)    LMP  (LMP Unknown)    SpO2 98%    BMI 16.80  kg/m??      Physical Exam:   BP 124/68    Pulse 73    Temp 97.9 ??F (36.6 ??C) (Axillary)    Resp 16    Ht 5\' 5"  (1.651 m)    Wt 100 lb 15.5 oz (45.8 kg)    LMP  (LMP Unknown)    SpO2 98%    BMI 16.80 kg/m??   General appearance: Alert but confused, NAD  Heart: regular rate and rhythm, S1, S2 normal, no murmur, click, rub or gallop  Abdomen: soft, non-tender; bowel sounds normal; no masses,  no organomegaly      Lab and Imaging Review   Recent Labs     10/09/21  0822 10/10/21  0833 10/11/21  0644   WBC 10.6 10.5 8.4   HGB 10.0* 10.1* 9.9*   MCV 92.1 92.4 92.7   PLT 244 242 220   NA 137 137 139   K 3.5 4.2 3.6   CL 100 102 101   CO2 18* 22 22   BUN 19 16 8    CREATININE 0.7 0.6 0.6   GLUCOSE 84 67* 115*   CALCIUM 8.5 8.9 8.7   PROT  --  5.4* 5.3*   LABALBU  --  3.5 3.4   AST  --  34 32   ALT  --  19 20   ALKPHOS  --  51 48   BILITOT  --  0.6 0.6   MG 2.00  --  1.80         Assessment:     Patient Active Problem List    Diagnosis Date Noted    Atrial fibrillation with RVR (HCC) 10/09/2021    Elevated troponin 10/09/2021    GIB (gastrointestinal bleeding) 10/06/2021    Hyperlipidemia     Compression fracture of body of thoracic vertebra (HCC)     Frequent UTI     Pneumonia 03/20/2017     87 yo w dementia and Fe def anemia w black stools admitted w acute encephalopathy, improving, and new onset afib w RVR, but found to have anemia and heme-pos stools. H/H 6/19, transfused.    Plan:   Supportive care  Was started on IV Dig.  Diet per  primary team and Speech  EGD planned for 10/18 was canceled by her family because they changed their mind   No plans for colonoscopy either  Will follow peripherally    Megan Salon, MD       (O) [phone]

## 2021-10-13 LAB — CBC WITH AUTO DIFFERENTIAL
Basophils %: 1.1 %
Basophils Absolute: 0.1 10*3/uL (ref 0.0–0.2)
Eosinophils %: 3.9 %
Eosinophils Absolute: 0.3 10*3/uL (ref 0.0–0.6)
Hematocrit: 30.6 % — ABNORMAL LOW (ref 36.0–48.0)
Hemoglobin: 9.7 g/dL — ABNORMAL LOW (ref 12.0–16.0)
Lymphocytes %: 6.8 %
Lymphocytes Absolute: 0.5 10*3/uL — ABNORMAL LOW (ref 1.0–5.1)
MCH: 28.4 pg (ref 26.0–34.0)
MCHC: 31.8 g/dL (ref 31.0–36.0)
MCV: 89.4 fL (ref 80.0–100.0)
MPV: 10.5 fL (ref 5.0–10.5)
Monocytes %: 10.6 %
Monocytes Absolute: 0.7 10*3/uL (ref 0.0–1.3)
Neutrophils %: 77.6 %
Neutrophils Absolute: 5.3 10*3/uL (ref 1.7–7.7)
Platelets: 182 10*3/uL (ref 135–450)
RBC: 3.42 M/uL — ABNORMAL LOW (ref 4.00–5.20)
RDW: 15.5 % — ABNORMAL HIGH (ref 12.4–15.4)
WBC: 6.8 10*3/uL (ref 4.0–11.0)

## 2021-10-13 LAB — COMPREHENSIVE METABOLIC PANEL
ALT: 18 U/L (ref 10–40)
AST: 20 U/L (ref 15–37)
Albumin/Globulin Ratio: 1.9 (ref 1.1–2.2)
Albumin: 3.3 g/dL — ABNORMAL LOW (ref 3.4–5.0)
Alkaline Phosphatase: 47 U/L (ref 40–129)
Anion Gap: 8 (ref 3–16)
BUN: 5 mg/dL — ABNORMAL LOW (ref 7–20)
CO2: 31 mmol/L (ref 21–32)
Calcium: 8.8 mg/dL (ref 8.3–10.6)
Chloride: 103 mmol/L (ref 99–110)
Creatinine: 0.6 mg/dL (ref 0.6–1.2)
Est, Glom Filt Rate: >60 (ref >60)
Glucose: 119 mg/dL — ABNORMAL HIGH (ref 70–99)
Potassium: 3.5 mmol/L (ref 3.5–5.1)
Sodium: 142 mmol/L (ref 136–145)
Total Bilirubin: 0.5 mg/dL (ref 0.0–1.0)
Total Protein: 5 g/dL — ABNORMAL LOW (ref 6.4–8.2)

## 2021-10-13 LAB — MAGNESIUM: Magnesium: 2 mg/dL (ref 1.80–2.40)

## 2021-10-13 MED ORDER — QUETIAPINE FUMARATE 25 MG PO TABS
25 MG | Freq: Every evening | ORAL | Status: DC
Start: 2021-10-13 — End: 2021-10-14
  Administered 2021-10-14: 25 mg via ORAL

## 2021-10-13 MED ORDER — TRIMETHOPRIM 100 MG PO TABS
100 MG | Freq: Every day | ORAL | Status: DC
Start: 2021-10-13 — End: 2021-10-14
  Administered 2021-10-14: 14:00:00 100 mg via ORAL

## 2021-10-13 MED ORDER — DIGOXIN 125 MCG PO TABS
125 MCG | ORAL_TABLET | Freq: Every day | ORAL | 0 refills | Status: DC
Start: 2021-10-13 — End: 2023-11-07

## 2021-10-13 MED ORDER — POTASSIUM CHLORIDE 10 MEQ/100ML IV SOLN
10100 MEQ/0ML | Freq: Once | INTRAVENOUS | Status: AC
Start: 2021-10-13 — End: 2021-10-13
  Administered 2021-10-13: 14:00:00 10 meq via INTRAVENOUS

## 2021-10-13 MED ORDER — HALOPERIDOL LACTATE 5 MG/ML IJ SOLN
5 MG/ML | Freq: Once | INTRAMUSCULAR | Status: AC
Start: 2021-10-13 — End: 2021-10-12
  Administered 2021-10-13: 03:00:00 5 mg via INTRAMUSCULAR

## 2021-10-13 MED ORDER — PANTOPRAZOLE SODIUM 40 MG PO TBEC
40 MG | ORAL_TABLET | Freq: Two times a day (BID) | ORAL | 0 refills | Status: AC
Start: 2021-10-13 — End: 2021-11-12

## 2021-10-13 MED ORDER — VENLAFAXINE HCL ER 37.5 MG PO CP24
37.5 MG | ORAL_CAPSULE | ORAL | 1 refills | Status: DC
Start: 2021-10-13 — End: 2023-11-07

## 2021-10-13 MED ORDER — DIGOXIN 125 MCG PO TABS
125 MCG | Freq: Every day | ORAL | Status: AC
Start: 2021-10-13 — End: 2021-10-14
  Administered 2021-10-14: 14:00:00 125 ug via ORAL

## 2021-10-13 MED ORDER — DOCUSATE SODIUM 50 MG/5ML PO LIQD
50 MG/5ML | Freq: Every day | ORAL | Status: DC
Start: 2021-10-13 — End: 2021-10-14
  Administered 2021-10-13 – 2021-10-14 (×2): 100 mg via ORAL

## 2021-10-13 MED ORDER — LACTULOSE 10 GM/15ML PO SOLN
10 GM/15ML | Freq: Once | ORAL | Status: AC
Start: 2021-10-13 — End: 2021-10-13
  Administered 2021-10-13: 16:00:00 10 g via ORAL

## 2021-10-13 MED ORDER — HALOPERIDOL LACTATE 5 MG/ML IJ SOLN
5 MG/ML | Freq: Four times a day (QID) | INTRAMUSCULAR | Status: DC | PRN
Start: 2021-10-13 — End: 2021-10-12

## 2021-10-13 MED ORDER — DOCUSATE SODIUM 100 MG PO CAPS
100 MG | Freq: Every day | ORAL | Status: DC
Start: 2021-10-13 — End: 2021-10-13

## 2021-10-13 MED ORDER — DIPHENHYDRAMINE HCL 50 MG/ML IJ SOLN
50 MG/ML | Freq: Once | INTRAMUSCULAR | Status: AC
Start: 2021-10-13 — End: 2021-10-12
  Administered 2021-10-13: 03:00:00 12.5 mg via INTRAVENOUS

## 2021-10-13 MED ORDER — QUETIAPINE FUMARATE 25 MG PO TABS
25 MG | ORAL_TABLET | Freq: Every evening | ORAL | 3 refills | Status: AC
Start: 2021-10-13 — End: 2023-11-07

## 2021-10-13 MED ORDER — DOCUSATE SODIUM 100 MG PO CAPS
100 MG | ORAL_CAPSULE | Freq: Every day | ORAL | 0 refills | Status: DC
Start: 2021-10-13 — End: 2021-10-14

## 2021-10-13 MED ORDER — DIPHENHYDRAMINE HCL 25 MG PO TABS
25 MG | Freq: Every evening | ORAL | Status: DC | PRN
Start: 2021-10-13 — End: 2021-10-14

## 2021-10-13 MED FILL — DOCU 50 MG/5ML PO LIQD: 50 MG/5ML | ORAL | Qty: 10

## 2021-10-13 MED FILL — LOVENOX 30 MG/0.3ML IJ SOSY: 30 MG/0.3ML | INTRAMUSCULAR | Qty: 0.3

## 2021-10-13 MED FILL — QUETIAPINE FUMARATE 25 MG PO TABS: 25 MG | ORAL | Qty: 1

## 2021-10-13 MED FILL — VENLAFAXINE HCL ER 37.5 MG PO CP24: 37.5 MG | ORAL | Qty: 1

## 2021-10-13 MED FILL — TRIMETHOPRIM 100 MG PO TABS: 100 MG | ORAL | Qty: 1

## 2021-10-13 MED FILL — DIPHENHYDRAMINE HCL 50 MG/ML IJ SOLN: 50 MG/ML | INTRAMUSCULAR | Qty: 1

## 2021-10-13 MED FILL — PROTONIX 40 MG IV SOLR: 40 MG | INTRAVENOUS | Qty: 40

## 2021-10-13 MED FILL — POTASSIUM CHLORIDE 10 MEQ/100ML IV SOLN: 10 MEQ/0ML | INTRAVENOUS | Qty: 100

## 2021-10-13 MED FILL — LACTULOSE 10 GM/15ML PO SOLN: 10 GM/15ML | ORAL | Qty: 30

## 2021-10-13 MED FILL — DEXTROSE IN LACTATED RINGERS 5 % IV SOLN: 5 % | INTRAVENOUS | Qty: 1000

## 2021-10-13 MED FILL — DIGOXIN 0.25 MG/ML IJ SOLN: 0.25 MG/ML | INTRAMUSCULAR | Qty: 2

## 2021-10-13 MED FILL — HALOPERIDOL LACTATE 5 MG/ML IJ SOLN: 5 MG/ML | INTRAMUSCULAR | Qty: 1

## 2021-10-13 NOTE — Progress Notes (Signed)
Gastroenterology Progress Note    Patient:   Lauren Bryant   Date of Birth:  24-May-1924   Facility:   [facility]   Referring/PCP: [name], APRN - CNP  Date:     10/13/2021  Consultant:   Megan Salon, MD, MD    Subjective:     This 87 y.o. female was admitted 10/06/2021 with a diagnosis of "Acute blood loss anemia [D62]  GIB (gastrointestinal bleeding) [K92.2]  Elevated troponin [R77.8]  Altered mental status, unspecified altered mental status type [R41.82]  Gastrointestinal hemorrhage, unspecified gastrointestinal hemorrhage type [K92.2]  Atrial flutter, unspecified type (HCC) [I48.92]" and is seen in consultation regarding "melena". Information was obtained from interview of  the patient and the patient's daughter, examination of the patient, and review of records. I did  update the past medical, surgical, social and / or family history.    Melena for days mild in GI tract assoc w anemia    Current status  Present  Diet Order: ADULT DIET; Dysphagia - Pureed; Mildly Thick (Nectar); No Drinking Straws, Liquids by spoon only and she is tolerating diet.  Recently, she has experienced no abdominal  Pain and she has not required Intravenous narcotic analgesics.  The patient has also experienced no constipation, diarrhea, hematochezia, nausea, and vomiting      Prior to Admission medications    Medication Sig Start Date End Date Taking? Authorizing Provider   ALPRAZolam (XANAX) 0.25 MG tablet Take 0.25 mg by mouth 3 times daily as needed for Anxiety or Sleep.    Historical Provider, MD   trimethoprim (TRIMPEX) 100 MG tablet Take 1 tablet by mouth daily 05/03/21   [name], APRN - CNP   simvastatin (ZOCOR) 20 MG tablet Take 1 tablet by mouth nightly 05/02/21   [name], APRN - CNP   venlafaxine (EFFEXOR XR) 37.5 MG extended release capsule TAKE 1 CAPSULE TWICE A DAY 04/06/21   Taya Jeraldine Loots, APRN - CNP   docusate sodium (COLACE) 100 MG capsule Take 100 mg by mouth 2 times  daily    Historical Provider, MD   ferrous sulfate 325 (65 Fe) MG tablet Take 325 mg by mouth daily (with breakfast)    Historical Provider, MD   aspirin 81 MG tablet Take 81 mg by mouth daily    Historical Provider, MD      Scheduled Medications:    digoxin  125 mcg IntraVENous Daily    QUEtiapine  25 mg Oral Nightly    pantoprazole (PROTONIX) 40 mg injection  40 mg IntraVENous Q12H    venlafaxine  37.5 mg Oral BID    enoxaparin  30 mg SubCUTAneous Daily    sodium chloride flush  5-40 mL IntraVENous 2 times per day     Infusions:    dextrose 5% in lactated ringers 50 mL/hr at 10/13/21 0436    sodium chloride      sodium chloride      sodium chloride 25 mL/hr at 10/12/21 1006     PRN Medications: melatonin, sodium chloride, [Held by provider] haloperidol lactate, sodium chloride, sodium chloride, ondansetron **OR** ondansetron, sodium chloride flush, polyethylene glycol, acetaminophen **OR** acetaminophen  Allergies:   Allergies   Allergen Reactions    Macrobid [Nitrofurantoin] Rash       Past Medical History:   Diagnosis Date    Compression fracture of body of thoracic vertebra (HCC)     Frequent UTI     Hyperlipidemia     TIA 2015  Past Surgical History:   Procedure Laterality Date    HYSTERECTOMY (CERVIX STATUS UNKNOWN)      TUBAL LIGATION         Social:   Social History     Tobacco Use    Smoking status: Former     Packs/day: 0.50     Types: Cigarettes     Quit date: 03/01/2020     Years since quitting: 1.6    Smokeless tobacco: Never   Substance Use Topics    Alcohol use: No     Family: History reviewed. No pertinent family history.    ROS: Pertinent items are noted in HPI.    Objective:   Vital Signs:  Temp (24hrs), Avg:97.9 ??F (36.6 ??C), Min:97.7 ??F (36.5 ??C), Max:98.1 ??F (36.7 ??C)     Systolic (24hrs), Avg:130 , Min:116 , Max:141      Diastolic (24hrs), Avg:62, Min:55, Max:71     Pulse  Avg: 80.2  Min: 66  Max: 100  BP (!) 141/59    Pulse 66    Temp 97.9 ??F (36.6 ??C)    Resp 18    Ht 5\' 5"  (1.651 m)    Wt  100 lb 15.5 oz (45.8 kg)    LMP  (LMP Unknown)    SpO2 100%    BMI 16.80 kg/m??      Physical Exam:   BP (!) 141/59    Pulse 66    Temp 97.9 ??F (36.6 ??C)    Resp 18    Ht 5\' 5"  (1.651 m)    Wt 100 lb 15.5 oz (45.8 kg)    LMP  (LMP Unknown)    SpO2 100%    BMI 16.80 kg/m??   General appearance: Alert but confused, NAD  Heart: regular rate and rhythm, S1, S2 normal, no murmur, click, rub or gallop  Abdomen: soft, non-tender; bowel sounds normal; no masses,  no organomegaly      Lab and Imaging Review   Recent Labs     10/11/21  0644 10/12/21  0703 10/13/21  0440   WBC 8.4 7.3 6.8   HGB 9.9* 9.2* 9.7*   MCV 92.7 89.9 89.4   PLT 220 200 182   NA 139 139 142   K 3.6 3.1* 3.5   CL 101 101 103   CO2 22 29 31    BUN 8 5* 5*   CREATININE 0.6 <0.5* 0.6   GLUCOSE 115* 140* 119*   CALCIUM 8.7 8.5 8.8   PROT 5.3* 4.9* 5.0*   LABALBU 3.4 3.2* 3.3*   AST 32 23 20   ALT 20 17 18    ALKPHOS 48 43 47   BILITOT 0.6 0.5 0.5   MG 1.80 1.70* 2.00         Assessment:     Patient Active Problem List    Diagnosis Date Noted    Atrial fibrillation with RVR (HCC) 10/09/2021    Elevated troponin 10/09/2021    GIB (gastrointestinal bleeding) 10/06/2021    Hyperlipidemia     Compression fracture of body of thoracic vertebra (HCC)     Frequent UTI     Pneumonia 03/20/2017     87 yo w dementia and Fe def anemia w black stools admitted w acute encephalopathy, improving, and new onset afib w RVR, but found to have anemia and heme-pos stools. H/H 6/19, transfused.    Plan:   Supportive care  Was started on IV Dig.  Diet per primary team and  Speech  EGD planned for 10/18 was canceled by her family because they changed their mind   No plans for colonoscopy either  Will follow peripherally    Megan Salon, MD       (O) [phone]

## 2021-10-13 NOTE — Discharge Instructions (Addendum)
Good nutrition is important when healing from an illness, injury, or surgery.  Follow any nutrition recommendations given to you during your hospital stay.   If you were given an oral nutrition supplement while in the hospital, continue to take this supplement at home.  You can take it with meals, in-between meals, and/or before bedtime. These supplements can be purchased at most local grocery stores, pharmacies, and chain super-stores.   If you have any questions about your diet or nutrition, call the hospital and ask for the dietitian.

## 2021-10-13 NOTE — Progress Notes (Signed)
Progress Note      PCP: [name], APRN - CNP    Date of Admission: 10/06/2021    Hospital Course: Lauren Bryant is a 87 year old female with past medical history of frequent UTIs, improved on outpatient trimethoprim, and hyperlipidemia who presented 10/13 with altered mental status.  Patient alerted to her medical bracelet that she was feeling dizzy at [employer] [facility] where she lives.  Her caretaker noted that when she woke up in the morning she was not acting like herself, was not talking or eating much.  When EMS arrived she noted her dizziness improved.  Per chart review family stated in the ED that patient has history of chronic dizziness and also endorsed patient was irritable with decreased p.o. intake for 2 days prior to admission.  They also reported patient experiences chronic dark stools.    In the ED Hemoccult was positive for melanotic stool, with troponin slightly elevated at 0.03.  H&H was 6.0/19.3 .  1 unit of PRBCs was transfused.  ECG revealed atrial flutter. Patient was admitted for further workup with CT head negative for acute process and chest x-ray unremarkable.  Patient was started on Protonix drip.  Additional 1 unit of PRBC was transfused 10/14, with H&H uptrending.       Cardiology was consulted.  A total of 3 doses of IV digoxin were administered (total 1 mg), with cardiology not recommending echo due to age and comorbidities making patient not a candidate for valvular intervention. Repeat troponin increased to 0.05 on 10/16.    Subjective: Patient seen today lying in bed with daughter at the bedside.  She is much more alert and oriented and able to interact with me during the interview.  She received IM Haldol and Benadryl last night for some acute agitation.  Was unable to receive nighttime dose of Seroquel.  Patient is tolerating advance pur??ed diet and eating more per daughter.  Discharge was tentatively planned for today but patient was able to be accepted due to  receiving Haldol in last 24 hours.    Kendal Hymen (daughter) cell: [phone]    Medications:  Reviewed    Infusion Medications    sodium chloride      sodium chloride      sodium chloride 25 mL/hr at 10/12/21 1006     Scheduled Medications    QUEtiapine  25 mg Oral Nightly    trimethoprim  100 mg Oral Daily    docusate  100 mg Oral Daily    [START ON 10/14/2021] digoxin  125 mcg Oral Daily    pantoprazole (PROTONIX) 40 mg injection  40 mg IntraVENous Q12H    venlafaxine  37.5 mg Oral BID    enoxaparin  30 mg SubCUTAneous Daily    sodium chloride flush  5-40 mL IntraVENous 2 times per day     PRN Meds: diphenhydrAMINE, melatonin, sodium chloride, [Held by provider] haloperidol lactate, sodium chloride, sodium chloride, ondansetron **OR** ondansetron, sodium chloride flush, polyethylene glycol, acetaminophen **OR** acetaminophen      Intake/Output Summary (Last 24 hours) at 10/13/2021 1651  Last data filed at 10/13/2021 1416  Gross per 24 hour   Intake 65 ml   Output 1350 ml   Net -1285 ml       Physical Exam Performed:    BP 115/60    Pulse 80    Temp 98.8 ??F (37.1 ??C) (Oral)    Resp 18    Ht 5\' 5"  (1.651 m)  Wt 100 lb 15.5 oz (45.8 kg)    LMP  (LMP Unknown)    SpO2 99%    BMI 16.80 kg/m??     General appearance: no acute distress, frail, appears stated age and interactive but difficult to understand  HEENT: Pupils equal, round. Conjunctivae/corneas clear.  Neck: Supple, with full range of motion. No jugular venous distention.  Respiratory:  Increased respiratory effort. Clear to auscultation, bilaterally without Rales/Wheezes/Rhonchi.  Cardiovascular: Regular rate and rhythm without murmurs, rubs or gallops.  Abdomen: Soft, non-tender, non-distended.  Musculoskeletal: No clubbing, cyanosis or edema bilaterally.  .  Skin: Skin color, texture, turgor normal.  No rashes or lesions.  Neurologic:  Neurovascularly intact without any focal sensory deficits  Psychiatric: Alert and oriented, thought content appropriate, normal  insight  Capillary Refill: Brisk,3 seconds, normal   Peripheral Pulses: +2 palpable, equal bilaterally       Labs:   Recent Labs     10/11/21  0644 10/12/21  0703 10/13/21  0440   WBC 8.4 7.3 6.8   HGB 9.9* 9.2* 9.7*   HCT 32.1* 29.4* 30.6*   PLT 220 200 182     Recent Labs     10/11/21  0644 10/12/21  0703 10/13/21  0440   NA 139 139 142   K 3.6 3.1* 3.5   CL 101 101 103   CO2 22 29 31    BUN 8 5* 5*   CREATININE 0.6 <0.5* 0.6   CALCIUM 8.7 8.5 8.8     Recent Labs     10/11/21  0644 10/12/21  0703 10/13/21  0440   AST 32 23 20   ALT 20 17 18    BILITOT 0.6 0.5 0.5   ALKPHOS 48 43 47     No results for input(s): INR in the last 72 hours.  No results for input(s): CKTOTAL, TROPONINI in the last 72 hours.    Urinalysis:      Lab Results   Component Value Date/Time    NITRU Negative 10/07/2021 09:50 AM    WBCUA 87 09/24/2021 03:21 AM    BACTERIA 4+ 09/24/2021 03:21 AM    RBCUA 1 09/24/2021 03:21 AM    BLOODU Negative 10/07/2021 09:50 AM    SPECGRAV 1.020 10/07/2021 09:50 AM    GLUCOSEU Negative 10/07/2021 09:50 AM       Radiology:  10/09/2021 MODIFIED BARIUM SWALLOW W VIDEO   Final Result   Positive for aspiration.      Please see separate speech pathology report for full discussion of findings   and recommendations.         CT HEAD WO CONTRAST   Final Result   No acute intracranial abnormality.  There is no change from prior examination.         XR CHEST PORTABLE   Final Result   1.  No acute abnormality.                 Assessment/Plan:    Active Hospital Problems    Diagnosis     Atrial fibrillation with RVR (HCC) [I48.91]      Priority: Medium    Elevated troponin [R77.8]      Priority: Medium    GIB (gastrointestinal bleeding) [K92.2]      Priority: Medium     Acute metabolic encephalopathy (with underlying dementia)  -possibly due to acute blood loss anemia, urinary retention, and hypotension / new onset A.Fib  - UA negative on 10/14  - Procalcitonin negative  on 10/15  - CT head wo contrast negative for acute process on  10/13  - patient cannot have MRI due to metal in her ear  - start quetiapine 25 mg oral nightly for insomnia and night-time agitation, recommend crushing and give in puree per dietary, discussed with nursing    Dysphagia  - discontinue D5 in LR infusion 50 ml/hr due to removal of IV and family wishes  -discussed with daughter Kendal Hymen with shared decision making decided to transition to PO medications for anticipated discharge tomorrow  - patient has increased oral intake of meals and puree as per dietary  - frozen oral dietary supplement ordered for additional nutritional support  - Speech following for dysphagia, appreciate recs:              - modified barium swallow completed today 10/18, positive for aspiration              - per recommendation advanced diet to pureed mildly thick nectar, no straws, 1:1 supervision with intake, liquids by spoon only              - dietary following    Acute Blood Loss Anemia  - no overt signs of bleeding  - positive FOBT on 10/13  - continue to trend H&H (9.7/30.6 today)  - Pt has received 2 units PRBC thus far  - GI consulted:              - patient family declining EGD, can consider outpatient workup     New onset A. Fib RVR  - discontinue IV digoxin 125 mcg daily, as patient's IV was removed, patient received dose today 10/20  - start PO digoxin 125 mcg on 10/21 crush and give with puree food  - discontinued telemetry monitoring due to patient pulling at lines and acute night-time agitation per family request  - aspirin 81 mg held  - EKG 10/17 showing sinus rhythm with PACs and nonspecific T wave abnormalities  - patient received 3 doses IV digoxin (1 mg total)  - cardiology consulted, appreciate input:              - if IV digoxin ineffective for HR control consider IV amio              - Echo cancelled, pt not candidate for valve intervention  - elevated troponin likely due to demand ischemia from tachycardia, no workup indicated.     Chronic UTI  - restart trimethoprim 100  mg today  - foley replaced 10/18  - void trial unsuccessful 10/17, patient retained 580 mL urine, straight cath x3    Hyperlipidemia  - simvastatin 20 mg discontinued after discussion with family and shared decision making       DVT Prophylaxis: sequential compression device  Diet: ADULT DIET; Dysphagia - Pureed; Mildly Thick (Nectar); No Drinking Straws, Liquids by spoon only  ADULT ORAL NUTRITION SUPPLEMENT; Breakfast, Lunch, Dinner; Frozen Oral Supplement  Code Status: DNR-CC     PT/OT Eval Status: recommended SNF at DC, AMPAC score of 7     Dispo - 10/21 to [facility] Salaam DO, PGY-1   [facility] and [facility] Program

## 2021-10-13 NOTE — Progress Notes (Addendum)
Occupational Therapy  Facility/Department: [facility]  Daily Treatment Note  NAME: Lauren Bryant  DOB: [birth year]  MRN: [id number]    Date of Service: 10/13/2021    Discharge Recommendations:  Subacute/Skilled Nursing Facility       Lauren Bryant scored a 7/24 on the AM-PAC ADL Inpatient form. Current research shows that an AM-PAC score of 17 or less is typically not associated with a discharge to the patient's home setting. Based on the patient's AM-PAC score and their current ADL deficits, it is recommended that the patient have 3-5 sessions per week of Occupational Therapy at d/c to increase the patient's independence.  Please see assessment section for further patient specific details.    If patient discharges prior to next session this note will serve as a discharge summary.  Please see below for the latest assessment towards goals.       Patient Diagnosis(es): The primary encounter diagnosis was Acute blood loss anemia. Diagnoses of Gastrointestinal hemorrhage, unspecified gastrointestinal hemorrhage type, Altered mental status, unspecified altered mental status type, Atrial flutter, unspecified type (HCC), and Elevated troponin were also pertinent to this visit.     Assessment    Pt agreeable to PT/OT co-tx with encouragement. Pt requires co-tx secondary to complexity of condition, decreased activity tolerance and increased assistance for ADL/mobility. Pt benefits from x 2 skilled hands to provide increased cues/feedback and promote improved safety and performance with ADLs.      Assessment: Pt continues to require assistance x2 for mobility using RW. Pt required min/mod A + Max A x2 for short mobility trials up to 62ft. Pt continues to be limited by cognition and direction following. Anticipate pt would benefit from ongoing OT in order to increase functional status prior to d/c home.  Activity Tolerance: Patient limited by fatigue;Patient limited by endurance;Treatment limited secondary to decreased  cognition  Discharge Recommendations: Subacute/Skilled Nursing Facility      Plan   Occupational Therapy Plan  Times Per Week: 2-3x/wk  Current Treatment Recommendations: Strengthening;Balance training;Functional mobility training;Endurance training;Patient/Caregiver education & training;Self-Care / ADL;Safety education & training;Positioning;Cognitive reorientation     Restrictions  Restrictions/Precautions  Restrictions/Precautions: Fall Risk;Contact Precautions;Isolation  Position Activity Restriction  Other position/activity restrictions: MDRO, hx of dementia, aphasia    Subjective   Subjective  Subjective: Pt sitting side of bed with family. Pt requesting to use the commode and urinate despite having foley. Difficult to redirect due to cognition.  Orientation  Overall Orientation Status: Impaired  Orientation Level: Oriented to person  Pain: Denies  Cognition  Overall Cognitive Status: Exceptions  Arousal/Alertness: Delayed responses to stimuli  Following Commands: Follows one step commands with increased time;Follows one step commands with repetition  Attention Span: Attends with cues to redirect  Safety Judgement: Decreased awareness of need for assistance;Decreased awareness of need for safety  Problem Solving: Decreased awareness of errors  Insights: Not aware of deficits  Initiation: Requires cues for all  Sequencing: Requires cues for all        Objective    Vitals  Vitals:    10/13/21    BP: 130/70   Pulse: 87   Resp: 18   Temp:    SpO2: 100%          Bed Mobility Training  Bed Mobility Training: No (pt sitting EOB with family upon arrival; in chair at end of session)  Balance  Sitting: High guard  Transfer Training  Transfer Training: Yes  Overall Level of Assistance: Assist X2 (min-mod  A x2 with stand pivot transfers; Bed to [facility]; BSC to chair)  Interventions: Safety awareness training;Tactile cues;Verbal cues;Demonstration;Manual cues  Sit to Stand: Minimum assistance;Assist X2  Stand to Sit: Assist  X2;Minimum assistance  Stand Pivot Transfers: Assist X2 (varying level of assist from min-mod A x2)  Toilet Transfer: Assist X2 (min A x2 to [facility])  Gait  Overall Level of Assistance: Assist X2 (varying level of assist; mod A x2 to min A x1+ Max Ax1)  Interventions: Demonstration;Safety awareness training;Tactile cues;Verbal cues;Visual cues  Step Length: Right shortened;Left shortened  Gait Abnormalities: Decreased step clearance;Trunk sway increased;Path deviations  Distance (ft): 30 Feet (10+10+10)  Assistive Device: Walker, rolling;Gait belt     ADL  Feeding: Thickened liquids  Toileting: Dependent/Total  Toileting Skilled Clinical Factors: foley        Safety Devices  Type of Devices: All fall risk precautions in place;Call light within reach;Chair alarm in place;Gait belt;Patient at risk for falls;Left in chair;Nurse notified     Patient Education  Education Given To: Family;Patient  Education Provided: Role of Therapy;Plan of Care;Transfer Training;Equipment;Fall Prevention Strategies;Precautions;Orientation;Family Education  Education Provided Comments: family education: role of OT, role of therapy, transfer training, safety with transfers, orthostatic BP, safety during hospital stay.  Education Method: Verbal  Barriers to Learning: Cognition;Hearing  Education Outcome: Continued education needed;Unable to demonstrate understanding;Unable to verbalize    Goals  Short Term Goals  Time Frame for Short Term Goals: 1 week unless otherwise specified by 10/16/21-- Ongoing as of 10/20  Short Term Goal 1: Pt will complete 1 grooming task with min A sitting or bed level  Short Term Goal 2: Pt will complete stand pivot to [facility] with min A x1  Short Term Goal 3: Pt will complete LBD with mod A  Short Term Goal 4: Pt will complete UB bathing with min A (trunk/BUE only)  Patient Goals   Patient goals : did not verbalize       Therapy Time   Individual Concurrent Group Co-treatment   Time In 1118         Time Out 1142          Minutes 24         Timed Code Treatment Minutes: 24 Minutes       Laqueta Linden, OTR/L

## 2021-10-13 NOTE — Progress Notes (Signed)
Physical Therapy  Facility/Department: [facility]  Daily Treatment Note  NAME: RONESHA HEENAN  DOB: [birth year]  MRN: [id number]    Date of Service: 10/13/2021    Discharge Recommendations:  Subacute/Skilled Nursing Facility   PT Equipment Recommendations  Equipment Needed: No  Other: defer to patient's facility    AMPAC 6 Clicks Inpatient Mobility:  AM-PAC Mobility Inpatient   How much difficulty turning over in bed?: A Lot  How much difficulty sitting down on / standing up from a chair with arms?: A Lot  How much difficulty moving from lying on back to sitting on side of bed?: A Lot  How much help from another person moving to and from a bed to a chair?: A Lot  How much help from another person needed to walk in hospital room?: A Lot  How much help from another person for climbing 3-5 steps with a railing?: A Lot  AM-PAC Inpatient Mobility Raw Score : 12  AM-PAC Inpatient T-Scale Score : 35.33  Mobility Inpatient CMS 0-100% Score: 68.66  Mobility Inpatient CMS G-Code Modifier : CL     Patient Diagnosis(es): The primary encounter diagnosis was Acute blood loss anemia. Diagnoses of Gastrointestinal hemorrhage, unspecified gastrointestinal hemorrhage type, Altered mental status, unspecified altered mental status type, Atrial flutter, unspecified type ([place]), and Elevated troponin were also pertinent to this visit.    Assessment   Assessment: pt required 2 skilled therapists to progress to higher level of function with maximal safety, PT focus on gait training, unable to formally assess balance as pt limited in command follow and slightly impulsive wanting to only either walk or sit down, pt pleasantly confused and appearing frustrated with not being able to communicate fully, pt required Max of 1 to Max of 2 for 78f with RW, able to tolerate 3 bouts of ambulation without indication of pain nor other symptoms, pt will benefit from continued Acute Inpatient Skilled PT to address functional mobility deficits,  agree with previous PT rec for SNF at DC  Activity Tolerance: Patient limited by fatigue;Patient limited by endurance;Treatment limited secondary to decreased cognition  Equipment Needed: No  Other: defer to patient's facility     Plan    Physcial Therapy Plan  General Plan: 3-5 times per week  Specific Instructions for Next Treatment: progress mobility as tolerated  Current Treatment Recommendations: Strengthening;Balance training;Functional mobility training;Transfer training;Endurance training;Gait training;Patient/Caregiver education & training;Safety education & training;Equipment evaluation, education, & procurement;Home exercise program;Therapeutic activities     Restrictions  Restrictions/Precautions  Restrictions/Precautions: Fall Risk, Contact Precautions, Isolation  Position Activity Restriction  Other position/activity restrictions: MDRO, hx of dementia, aphasia     Subjective    Subjective  Subjective: Pt sitting eob attempting to transfer to bedside commode, family attempting to have pt wait for PCA,  Pt HOH, hears better in L ear  Pain: Denies  Orientation  Overall Orientation Status: Impaired  Orientation Level: Oriented to person  Cognition  Overall Cognitive Status: Exceptions  Arousal/Alertness: Delayed responses to stimuli  Following Commands: Follows one step commands with increased time;Follows one step commands with repetition  Attention Span: Attends with cues to redirect  Memory: Decreased short term memory  Safety Judgement: Decreased awareness of need for assistance;Decreased awareness of need for safety  Problem Solving: Decreased awareness of errors  Insights: Not aware of deficits  Initiation: Requires cues for all  Sequencing: Requires cues for all  Cognition Comment: patient's daughter said that she has short term memory deficits at baseline.  Objective   Vitals   Vitals:    10/13/21 1212   BP: 130/70   Pulse: 87   Resp: 18   Temp: 97.9 ??F (36.6 ??C)   SpO2: 100%         Bed  Mobility Training  Bed Mobility Training: No (pt sitting EOB with family upon arrival; in chair at end of session)  Balance  Sitting: High guard  Transfer Training  Transfer Training: Yes  Overall Level of Assistance: Assist X2 (min-mod A x2 with stand pivot transfers; Bed to [facility]; BSC to chair)  Interventions: Safety awareness training;Tactile cues;Verbal cues;Demonstration;Manual cues  Sit to Stand: Minimum assistance;Assist X2  Stand to Sit: Assist X2;Minimum assistance  Stand Pivot Transfers: Assist X2 (varying level of assist from min-mod A x2)  Toilet Transfer: Assist X2 (min A x2 to [facility])  Gait Training  Gait Training: Yes  Gait  Overall Level of Assistance: Assist X2 (varies from Max of 1 to Mod of 2 to Max of 2)  Interventions: Demonstration;Safety awareness training;Tactile cues;Verbal cues;Visual cues  Base of Support: Center of gravity altered;Narrowed  Speed/Cadence: Pace decreased (< 100 feet/min);Shuffled;Slow  Step Length: Right shortened;Left shortened  Gait Abnormalities: Decreased step clearance;Trunk sway increased;Path deviations (severe kyphosis with trunk flexion, assist required to keep bos close to frame of RW)  Distance (ft): 30 Feet (10+10+10)  Assistive Device: Walker, rolling;Gait belt     PT Exercises  Exercise Treatment: seated B LE hip flex x 15 reps     Safety Devices  Type of Devices: All fall risk precautions in place;Call light within reach;Chair alarm in place;Gait belt;Patient at risk for falls;Left in chair;Nurse notified       Goals  Short Term Goals  Time Frame for Short Term Goals: 1 week 10/16/21--10/18 All goals continue  Short Term Goal 1: Supine <> sit with minimum assistance.  Short Term Goal 2: Sit <> stand with moderate assistance.  Short Term Goal 3: Bed <> chair with least restrictive assistive device and moderate assist  Short Term Goal 4: Ambulate 50 feet with least restrictive assistive device and moderate assist  Short Term Goal 5: By 10/12/21 Patient will  tolerate 12-15 reps of her exercises to maximize her strength/endurance - MET - ongoing   Patient Goals   Patient Goals : To sit in the chair. Patient's past medical history includes dementia. Patient's daughter requested that patient receive additional therapy at a SNF upon discharge.    Education  Patient Education  Education Given To: Patient;Family  Education Provided: Role of Therapy;Plan of Care;Home Exercise Program;Fall Prevention Strategies;Transfer Training;Equipment  Education Provided Comments: Disease Specific Education: Patient educated on importance of out of bed mobility, importance of completing her exercises, prevention of complications of bedrest, and general safety (importance of using call bell) during hospitalization.  Education Method: Demonstration;Verbal  Barriers to Learning: Cognition;Hearing  Education Outcome: Verbalized understanding;Continued education needed    Therapy Time   Individual Concurrent Group Co-treatment   Time In 1117         Time Out 1140         Minutes 23         Timed Code Treatment Minutes: 23 Minutes     If pt is unable to be seen after this session, please let this note serve as discharge summary.  Please see case management note for discharge disposition.  Thank you.   Anderson Malta Keymora Grillot, PT

## 2021-10-13 NOTE — Plan of Care (Addendum)
Problem: Discharge Planning  Goal: Discharge to home or other facility with appropriate resources  Outcome: Progressing     Problem: Pain  Goal: Verbalizes/displays adequate comfort level or baseline comfort level  Outcome: Progressing   Patient verbalized comfort and stated "just let me sleep."  Problem: Skin/Tissue Integrity  Goal: Absence of new skin breakdown  Description: 1.  Monitor for areas of redness and/or skin breakdown  2.  Assess vascular access sites hourly  3.  Every 4-6 hours minimum:  Change oxygen saturation probe site  4.  Every 4-6 hours:  If on nasal continuous positive airway pressure, respiratory therapy assess nares and determine need for appliance change or resting period.  Outcome: Progressing     Problem: Safety - Adult  Goal: Free from fall injury  Outcome: Progressing     Problem: ABCDS Injury Assessment  Goal: Absence of physical injury  Outcome: Progressing     For patient safety, an AVASYS camera has been placed in patient's room.  AVASYS monitoring needed for Confusion, Increased Fall Risk, and Pulling at Lines.  Writer placed call to monitor observer to verify camera number and review patient name, reason for monitoring, and contact information for primary RN.  Patient, Family/Healthcare Decision Maker, and HCPOA notified of use of remote monitoring upon implementation of camera and verbalized understanding.

## 2021-10-13 NOTE — Care Coordination-Inpatient (Addendum)
Received call from Maralyn Sago with [facility] and she states they do not have any beds to accommodate patient.  Discussed with patient's daughter.  She states her next choice would be [facility].  Placed call to Erin with [facility] and notified of  referral.  She states they will review and call back with whether or not they are able to accept.  Spoke with MD who states patient is ready for discharge today.  Transport with [facility] arranged for 5pm in anticipation of discharge today.       1254 addendum: Received call from Erin who states they are unable to accept patient today due to she has been receiving Haldol.  She states that if we can hold it tonight and manage her agitation with other meds, then they should be able to accept her tomorrow if her agitation is controlled by other means.  Transport with [facility] cancelled by [place].

## 2021-10-13 NOTE — Progress Notes (Signed)
Secure message sent to MD Rouyanian:    "Patients IV came out. Family is ok with stopping fluids and would not like her to be stuck again if avoidable. Can you change her IV medication to PO? She was able to take PO medicines this afternoon. Also can we d/c her tele box to help with her agitation/pulling at things? Thanks"

## 2021-10-14 LAB — CBC WITH AUTO DIFFERENTIAL
Basophils %: 1 %
Basophils Absolute: 0.1 10*3/uL (ref 0.0–0.2)
Eosinophils %: 4.2 %
Eosinophils Absolute: 0.3 10*3/uL (ref 0.0–0.6)
Hematocrit: 29.1 % — ABNORMAL LOW (ref 36.0–48.0)
Hemoglobin: 9.1 g/dL — ABNORMAL LOW (ref 12.0–16.0)
Lymphocytes %: 9.1 %
Lymphocytes Absolute: 0.6 10*3/uL — ABNORMAL LOW (ref 1.0–5.1)
MCH: 28 pg (ref 26.0–34.0)
MCHC: 31.3 g/dL (ref 31.0–36.0)
MCV: 89.4 fL (ref 80.0–100.0)
MPV: 10.7 fL — ABNORMAL HIGH (ref 5.0–10.5)
Monocytes %: 9.3 %
Monocytes Absolute: 0.6 10*3/uL (ref 0.0–1.3)
Neutrophils %: 76.4 %
Neutrophils Absolute: 5 10*3/uL (ref 1.7–7.7)
Platelets: 163 10*3/uL (ref 135–450)
RBC: 3.25 M/uL — ABNORMAL LOW (ref 4.00–5.20)
RDW: 15.6 % — ABNORMAL HIGH (ref 12.4–15.4)
WBC: 6.5 10*3/uL (ref 4.0–11.0)

## 2021-10-14 LAB — COMPREHENSIVE METABOLIC PANEL
ALT: 17 U/L (ref 10–40)
AST: 19 U/L (ref 15–37)
Albumin/Globulin Ratio: 2 (ref 1.1–2.2)
Albumin: 3.4 g/dL (ref 3.4–5.0)
Alkaline Phosphatase: 46 U/L (ref 40–129)
Anion Gap: 9 (ref 3–16)
BUN: 11 mg/dL (ref 7–20)
CO2: 29 mmol/L (ref 21–32)
Calcium: 8.7 mg/dL (ref 8.3–10.6)
Chloride: 104 mmol/L (ref 99–110)
Creatinine: <0.5 mg/dL — ABNORMAL LOW (ref 0.6–1.2)
Est, Glom Filt Rate: >60 (ref >60)
Glucose: 107 mg/dL — ABNORMAL HIGH (ref 70–99)
Potassium: 3.5 mmol/L (ref 3.5–5.1)
Sodium: 142 mmol/L (ref 136–145)
Total Bilirubin: 0.5 mg/dL (ref 0.0–1.0)
Total Protein: 5.1 g/dL — ABNORMAL LOW (ref 6.4–8.2)

## 2021-10-14 LAB — MAGNESIUM: Magnesium: 1.9 mg/dL (ref 1.80–2.40)

## 2021-10-14 MED ORDER — DOCUSATE SODIUM 100 MG PO CAPS
100 MG | ORAL_CAPSULE | Freq: Every day | ORAL | 0 refills | Status: AC
Start: 2021-10-14 — End: 2021-11-13

## 2021-10-14 MED FILL — DOCU 50 MG/5ML PO LIQD: 50 MG/5ML | ORAL | Qty: 10

## 2021-10-14 MED FILL — PROTONIX 40 MG IV SOLR: 40 MG | INTRAVENOUS | Qty: 40

## 2021-10-14 MED FILL — QUETIAPINE FUMARATE 25 MG PO TABS: 25 MG | ORAL | Qty: 1

## 2021-10-14 MED FILL — DIGOXIN 125 MCG PO TABS: 125 MCG | ORAL | Qty: 1

## 2021-10-14 MED FILL — VENLAFAXINE HCL ER 37.5 MG PO CP24: 37.5 MG | ORAL | Qty: 1

## 2021-10-14 MED FILL — LOVENOX 30 MG/0.3ML IJ SOSY: 30 MG/0.3ML | INTRAMUSCULAR | Qty: 0.3

## 2021-10-14 NOTE — Care Coordination-Inpatient (Signed)
CASE MANAGEMENT DISCHARGE SUMMARY      Discharge to: [facility]     Precertification completed: [facility] Exemption Notification (HENS) completed: yes    IMM given: (date) 10/14/21    Transportation: ambulance    Medical Transport explained to pt/family. Pt/family voice no agency preference.    Agency used: [facility]   Pick up time: 12 noon    Ambulance form completed: Yes    Confirmed discharge plan with: patient/daughter/RN/Erin with [facility]      Patient: yes     Family:  yes    Name:Bonnie  Contact number: [id number]     Facility/Agency, name:  [facility] faxed   Phone number for report to facility: [phone]     RN, name: Karolee Stamps     Note: Discharging nurse to complete [facility], reconcile [facility], and place final copy with patient's discharge packet. RN to ensure that written prescriptions for  Level II medications are sent with patient to the facility as per protocol.

## 2021-10-14 NOTE — Discharge Summary (Signed)
Discharge Summary    Patient ID: Lauren Bryant      Patient's PCP: [name], APRN - CNP    Admit Date: 10/06/2021     Discharge Date: 10/14/2021      Admitting Physician: Thomes Cake, MD     Discharge Physician: Autumn Patty, DO     Discharge Diagnoses:       Active Hospital Problems    Diagnosis     Atrial fibrillation with RVR (HCC) [I48.91]      Priority: Medium    Elevated troponin [R77.8]      Priority: Medium    GIB (gastrointestinal bleeding) [K92.2]      Priority: Medium       The patient was seen and examined on day of discharge and this discharge summary is in conjunction with any daily progress note from day of discharge.    Hospital Course: Lauren Bryant is a 87 year old female with past medical history of frequent UTIs, improved on outpatient trimethoprim, and hyperlipidemia who presented 10/13 with altered mental status.  Patient alerted to her medical bracelet that she was feeling dizzy at [facility] where she lives.  Her caretaker noted that when she woke up in the morning she was not acting like herself, was not talking or eating much.  When EMS arrived she noted her dizziness improved.  Per chart review family stated in the ED that patient has history of chronic dizziness and also endorsed patient was irritable with decreased p.o. intake for 2 days prior to admission.  They also reported patient experiences chronic dark stools.    In the ED Hemoccult was positive for melanotic stool, with troponin slightly elevated at 0.03.  H&H was 6.0/19.3 .  1 unit of PRBCs was transfused.  ECG revealed atrial flutter. Patient was admitted for further workup with CT head negative for acute process and chest x-ray unremarkable.  Patient was started on Protonix drip.  Additional 1 unit of PRBC was transfused 10/14, with H&H uptrending.       Cardiology was consulted.  A total of 3 doses of IV digoxin were administered (total 1 mg), with cardiology not recommending echo due to age and  comorbidities making patient not a candidate for valvular intervention. Repeat troponin increased to 0.05 on 10/16, per cardiology likely due to demand ischemia from tachycardia with no further work-up indicated.  Patient was started on IV digoxin 125 mcg daily, and by time of discharge was transitioned to oral digoxin 125 mcg daily with follow-up instructions to make an appointment with outpatient cardiology.    Patient was treated for acute metabolic encephalopathy with underlying dementia, possibly secondary to acute blood loss anemia, urinary retention, and hypertension/new onset A. fib.  Patient did have 1 episode of acute nighttime agitation, where patient was pulling at IV lines and trying to get out of bed.  Patient was administered 1 dose of IM Haldol and Ativan resulting in severe somnolence the following day.  She was transitioned to oral quetiapine for sleep and nighttime agitation which she tolerated well and subsequently was added to her medications at discharge.    Hospital course was complicated by new onset dysphagia which was evaluated by speech pathology.  Patient received a modified barium swallow 10/18 which was positive for 1 episode of aspiration, resulting in speech recommending a diet of pur??ed mildly thick nectar, with one-to-one supervision with intake.  Patient tolerated advanced diet, with help of daughter was able to transition to p.o.  meds crushed in applesauce, and by time of discharge was able to spoon feed with minimal assistance.  Patient was completely transitioned to p.o. medications with IV removed by time of discharge.    Patient had Foley catheter throughout hospitalization, with a unsuccessful void trial on 10/17 due to retention of urine.  Patient was discharged with catheter and restarted on PTA trimethoprim due to history of chronic UTIs.      Physical Exam Performed:     BP (!) 111/54    Pulse 95    Temp 98.8 ??F (37.1 ??C) (Axillary)    Resp 16    Ht 5\' 5"  (1.651 m)    Wt  100 lb 15.5 oz (45.8 kg)    LMP  (LMP Unknown)    SpO2 93%    BMI 16.80 kg/m??       General appearance:  No apparent distress, appears stated age and cooperative.  HEENT:  Normal cephalic, atraumatic without obvious deformity. Pupils equal, round.  Extra ocular muscles intact. Conjunctivae/corneas clear.  Neck: Supple, with full range of motion. No jugular venous distention.   Respiratory:  Normal respiratory effort. Clear to auscultation, bilaterally without Rales/Wheezes/Rhonchi.  Cardiovascular:  Regular rate and rhythm without murmurs, rubs or gallops.  Abdomen: Soft, non-tender, non-distended with normal bowel sounds.  Musculoskeletal:  No clubbing, cyanosis or edema bilaterally.  Full range of motion without deformity.  Skin: Skin color, texture, turgor normal.  No rashes or lesions.  Neurologic:  Neurovascularly intact without any focal sensory/motor deficits.   Psychiatric:  Alert and oriented, thought content appropriate, normal insight  Capillary Refill: Brisk,< 3 seconds   Peripheral Pulses: +2 palpable, equal bilaterally       Labs: For convenience and continuity at follow-up the following most recent labs are provided:      CBC:    Lab Results   Component Value Date/Time    WBC 6.5 10/14/2021 05:42 AM    HGB 9.1 10/14/2021 05:42 AM    HCT 29.1 10/14/2021 05:42 AM    PLT 163 10/14/2021 05:42 AM       Renal:    Lab Results   Component Value Date/Time    NA 142 10/14/2021 05:42 AM    K 3.5 10/14/2021 05:42 AM    K 4.2 10/10/2021 08:33 AM    CL 104 10/14/2021 05:42 AM    CO2 29 10/14/2021 05:42 AM    BUN 11 10/14/2021 05:42 AM    CREATININE <0.5 10/14/2021 05:42 AM    CALCIUM 8.7 10/14/2021 05:42 AM         Significant Diagnostic Studies    Radiology:   FL MODIFIED BARIUM SWALLOW W VIDEO   Final Result   Positive for aspiration.      Please see separate speech pathology report for full discussion of findings   and recommendations.         CT HEAD WO CONTRAST   Final Result   No acute intracranial  abnormality.  There is no change from prior examination.         XR CHEST PORTABLE   Final Result   1.  No acute abnormality.                Consults:     IP CONSULT TO GI  IP CONSULT TO PALLIATIVE CARE  IP CONSULT TO CARDIOLOGY  IP CONSULT TO PALLIATIVE CARE    Disposition:  SNF, [facility]    Condition at Discharge: Stable  Discharge Instructions/Follow-up:  PCP for medication reconciliation, cardiology for A.fib management    Code Status:  DNR-CC    Activity: activity as tolerated    Diet: dysphagia diet, pureed mildly thick nectar, no drinking straws, liquids by spoon      Discharge Medications:     Discharge Medication List as of 10/14/2021 11:12 AM             Details   digoxin (LANOXIN) 125 MCG tablet Take 1 tablet by mouth daily, Disp-30 tablet, R-0Normal      pantoprazole (PROTONIX) 40 MG tablet Take 1 tablet by mouth 2 times daily (before meals), Disp-60 tablet, R-0Normal      QUEtiapine (SEROQUEL) 25 MG tablet Take 1 tablet by mouth nightly, Disp-60 tablet, R-3Normal                Details   docusate sodium (COLACE) 100 MG capsule Take 1 capsule by mouth daily, Disp-30 capsule, R-0Normal      venlafaxine (EFFEXOR XR) 37.5 MG extended release capsule TAKE 1 CAPSULE TWICE A DAY. Open capsule and sprinkle onto spoon of applesauce with supervision., Disp-180 capsule, R-1Normal                Details   trimethoprim (TRIMPEX) 100 MG tablet Take 1 tablet by mouth daily, Disp-90 tablet, R-1Normal      simvastatin (ZOCOR) 20 MG tablet Take 1 tablet by mouth nightly, Disp-14 tablet, R-0Normal      ferrous sulfate 325 (65 Fe) MG tablet Take 325 mg by mouth daily (with breakfast)Historical Med      aspirin 81 MG tablet Take 81 mg by mouth daily             Time Spent on discharge is more than 30 minutes in the examination, evaluation, counseling and review of medications and discharge plan.      Signed:    Autumn Patty, DO   10/14/2021      Thank you [name], APRN - CNP for the opportunity to be involved  in this patient's care. If you have any questions or concerns please feel free to contact me at [phone].

## 2021-10-14 NOTE — Progress Notes (Signed)
For patient safety, an AVASYS camera has been placed in patient's room.  AVASYS monitoring needed for Confusion, Increased Fall Risk, and Pulling at [employer] placed call to monitor observer to verify camera number  and review patient name, reason for monitoring, and contact information for primary RN.  Patient, Family/Healthcare Decision Maker, HCPOA, and Legal Guardian notified of use of remote monitoring upon implementation of camera and verbalized understanding.

## 2021-10-14 NOTE — Plan of Care (Signed)
Problem: Skin/Tissue Integrity  Goal: Absence of new skin breakdown  Description: 1.  Monitor for areas of redness and/or skin breakdown  2.  Assess vascular access sites hourly  3.  Every 4-6 hours minimum:  Change oxygen saturation probe site  4.  Every 4-6 hours:  If on nasal continuous positive airway pressure, respiratory therapy assess nares and determine need for appliance change or resting period.  Outcome: Progressing     Problem: Safety - Adult  Goal: Free from fall injury  Outcome: Progressing  Flowsheets (Taken 10/14/2021 0037)  Free From Fall Injury: Instruct family/caregiver on patient safety

## 2021-10-14 NOTE — Progress Notes (Signed)
Pt d/c'd to the [facility].  Notified [facility] and removed tele box. Reviewed d/c instructions, home meds, and  f/u information utilizing teach-back method.  Scripts for  given to patient. Patient verbalized understanding. Pt wheeled out to front entrance by transport with all belongings in hand.

## 2021-11-24 DEATH — deceased
